# Patient Record
Sex: Male | Born: 1935
Health system: Southern US, Community
[De-identification: ages and names within clinical notes are randomized; demographics above are authoritative.]

## PROBLEM LIST (undated history)

## (undated) DIAGNOSIS — IMO0001 Reserved for inherently not codable concepts without codable children: Secondary | ICD-10-CM

## (undated) DIAGNOSIS — K219 Gastro-esophageal reflux disease without esophagitis: Secondary | ICD-10-CM

## (undated) DIAGNOSIS — J309 Allergic rhinitis, unspecified: Secondary | ICD-10-CM

## (undated) DIAGNOSIS — K5790 Diverticulosis of intestine, part unspecified, without perforation or abscess without bleeding: Secondary | ICD-10-CM

## (undated) DIAGNOSIS — K2210 Ulcer of esophagus without bleeding: Secondary | ICD-10-CM

## (undated) DIAGNOSIS — R7303 Prediabetes: Secondary | ICD-10-CM

## (undated) DIAGNOSIS — I2510 Atherosclerotic heart disease of native coronary artery without angina pectoris: Secondary | ICD-10-CM

## (undated) DIAGNOSIS — J189 Pneumonia, unspecified organism: Secondary | ICD-10-CM

## (undated) DIAGNOSIS — I10 Essential (primary) hypertension: Secondary | ICD-10-CM

## (undated) DIAGNOSIS — M545 Low back pain, unspecified: Secondary | ICD-10-CM

## (undated) DIAGNOSIS — K649 Unspecified hemorrhoids: Secondary | ICD-10-CM

## (undated) DIAGNOSIS — I4891 Unspecified atrial fibrillation: Secondary | ICD-10-CM

## (undated) DIAGNOSIS — D693 Immune thrombocytopenic purpura: Secondary | ICD-10-CM

## (undated) DIAGNOSIS — E7800 Pure hypercholesterolemia, unspecified: Secondary | ICD-10-CM

## (undated) DIAGNOSIS — D126 Benign neoplasm of colon, unspecified: Secondary | ICD-10-CM

## (undated) DIAGNOSIS — H9190 Unspecified hearing loss, unspecified ear: Secondary | ICD-10-CM

## (undated) HISTORY — DX: Diverticulosis of intestine, part unspecified, without perforation or abscess without bleeding: K57.90

## (undated) HISTORY — DX: Pure hypercholesterolemia, unspecified: E78.00

## (undated) HISTORY — PX: HEMORRHOID BANDING: SHX5850

## (undated) HISTORY — DX: Unspecified atrial fibrillation: I48.91

## (undated) HISTORY — PX: TONSILLECTOMY AND ADENOIDECTOMY: SUR1326

## (undated) HISTORY — DX: Benign neoplasm of colon, unspecified: D12.6

## (undated) HISTORY — DX: Atherosclerotic heart disease of native coronary artery without angina pectoris: I25.10

## (undated) HISTORY — DX: Unspecified hearing loss, unspecified ear: H91.90

## (undated) HISTORY — DX: Unspecified hemorrhoids: K64.9

## (undated) HISTORY — DX: Immune thrombocytopenic purpura: D69.3

## (undated) HISTORY — DX: Low back pain, unspecified: M54.50

## (undated) HISTORY — DX: Gastro-esophageal reflux disease without esophagitis: K21.9

## (undated) HISTORY — DX: Pneumonia, unspecified organism: J18.9

## (undated) HISTORY — DX: Reserved for inherently not codable concepts without codable children: IMO0001

## (undated) HISTORY — DX: Allergic rhinitis, unspecified: J30.9

## (undated) HISTORY — DX: Essential (primary) hypertension: I10

## (undated) HISTORY — DX: Ulcer of esophagus without bleeding: K22.10

## (undated) HISTORY — PX: CATARACT EXTRACTION: SUR2

## (undated) HISTORY — DX: Prediabetes: R73.03

## (undated) HISTORY — PX: COLONOSCOPY W/ POLYPECTOMY: SHX1380

## (undated) HISTORY — PX: INGUINAL HERNIA REPAIR: SHX194

---

## 2009-08-02 HISTORY — PX: CORONARY ARTERY BYPASS GRAFT: SHX141

## 2010-09-03 HISTORY — PX: UPPER GI ENDOSCOPY: SHX6162

## 2011-05-12 ENCOUNTER — Ambulatory Visit (INDEPENDENT_AMBULATORY_CARE_PROVIDER_SITE_OTHER): Payer: Medicare HMO | Admitting: Cardiology

## 2011-05-12 ENCOUNTER — Encounter: Payer: Self-pay | Admitting: Cardiology

## 2011-05-12 VITALS — BP 140/76 | HR 60 | Resp 16 | Ht 70.0 in | Wt 158.0 lb

## 2011-05-12 DIAGNOSIS — I4891 Unspecified atrial fibrillation: Secondary | ICD-10-CM | POA: Insufficient documentation

## 2011-05-12 DIAGNOSIS — E785 Hyperlipidemia, unspecified: Secondary | ICD-10-CM

## 2011-05-12 DIAGNOSIS — I10 Essential (primary) hypertension: Secondary | ICD-10-CM | POA: Insufficient documentation

## 2011-05-12 DIAGNOSIS — I2510 Atherosclerotic heart disease of native coronary artery without angina pectoris: Secondary | ICD-10-CM | POA: Insufficient documentation

## 2011-05-12 MED ORDER — ASPIRIN EC 81 MG PO TBEC: 81.0000 mg | DELAYED_RELEASE_TABLET | Freq: Every day | ORAL | Status: AC

## 2011-05-12 NOTE — Assessment & Plan Note (Signed)
I reviewed his lipids and they were at target this month.  No change in therapy is indicated.

## 2011-05-12 NOTE — Patient Instructions (Signed)
Stop Coumadin and start Asprin 81 mg a day Continue all other medications as listed  Follow up in 6 months with Dr Antoine Poche.  You will receive a letter in the mail 2 months before you are due.  Please call us when you receive this letter to schedule your follow up appointment.

## 2011-05-12 NOTE — Assessment & Plan Note (Signed)
I reviewed extensively all of his hospital notes.  He was only in afib in the hospital post CABG.  He has had no recurrence of this documented.  Therefor, according to guidelines, no further coumadin is indicated.  He start 81 mg ASA.

## 2011-05-12 NOTE — Assessment & Plan Note (Signed)
The patient has no new sypmtoms.  No further cardiovascular testing is indicated.  We will continue with aggressive risk reduction and meds as listed.  

## 2011-05-12 NOTE — Assessment & Plan Note (Signed)
The blood pressure is at target. No change in medications is indicated. We will continue with therapeutic lifestyle changes (TLC).  

## 2011-05-12 NOTE — Progress Notes (Signed)
HPI The patient presents as a new patient.  He has a history of CAD s/p CABG.  He had his angina. He presented with unstable angina and went to bypass surgery. He has now moved here. Since having his surgery he does very well. He denies any chest discomfort, neck or arm discomfort. He has no palpitations, presyncope or syncope. He denies any shortness of breath, PND or orthopnea. He works very hard and walks 30 miles per week. With this he can bring on his previous symptoms.  Allergies  Allergen Reactions  . Sulfur     Current Outpatient Prescriptions  Medication Sig Dispense Refill  . atorvastatin (LIPITOR) 40 MG tablet Take 40 mg by mouth daily.        . calcium carbonate (OS-CAL) 600 MG TABS Take 600 mg by mouth daily.        . fexofenadine (ALLEGRA) 180 MG tablet Take 180 mg by mouth daily.        [name] lisinopril-hydrochlorothiazide (PRINZIDE,ZESTORETIC) 20-12.5 MG per tablet Take 1 tablet by mouth daily.        . metoprolol tartrate (LOPRESSOR) 25 MG tablet Take 25 mg by mouth 2 (two) times daily.        . Multiple Vitamin (MULTIVITAMIN) tablet Take 1 tablet by mouth daily.        . Omega-3 Fatty Acids (FISH OIL PO) Take by mouth.        . vitamin C (ASCORBIC ACID) 500 MG tablet Take 500 mg by mouth daily.        [name] warfarin (COUMADIN) 5 MG tablet Take 5 mg by mouth as directed.          Past Medical History  Diagnosis Date  . Hypercholesteremia   . Hypertension   . Atherosclerosis   . Arrhythmia     peri op afib    Past Surgical History  Procedure Date  . Coronary artery bypass graft     Family History  Problem Relation Age of Onset  . Heart disease Brother     History   Social History  . Marital Status: Unknown    Spouse Name: N/A    Number of Children: N/A  . Years of Education: N/A   Occupational History  . Not on file.   Social History Main Topics  . Smoking status: Never Smoker   . Smokeless tobacco: Not on file  . Alcohol Use: Not on file  . Drug Use:  Not on file  . Sexually Active: Not on file   Other Topics Concern  . Not on file   Social History Narrative  . No narrative on file    ROS:  Recent pneumonia.  Otherwise as stated in the HPI and negative for all other systems.   PHYSICAL EXAM BP 140/76  Pulse 60  Resp 16  Ht 5\' 10"  (1.778 m)  Wt 158 lb (71.668 kg)  BMI 22.67 kg/m2 GENERAL:  Well appearing HEENT:  Pupils equal round and reactive, fundi not visualized, oral mucosa unremarkable NECK:  No jugular venous distention, waveform within normal limits, carotid upstroke brisk and symmetric, no bruits, no thyromegaly LYMPHATICS:  No cervical, inguinal adenopathy LUNGS:  Clear to auscultation bilaterally BACK:  No CVA tenderness CHEST:  Well healed sternotomy scar. HEART:  PMI not displaced or sustained,S1 and S2 within normal limits, no S3, no S4, no clicks, no rubs, no murmurs ABD:  Flat, positive bowel sounds normal in frequency in pitch, no bruits, no rebound, no guarding,  no midline pulsatile mass, no hepatomegaly, no splenomegaly EXT:  2 plus pulses throughout, no edema, no cyanosis no clubbing SKIN:  No rashes no nodules NEURO:  Cranial nerves II through XII grossly intact, motor grossly intact throughout PSYCH:  Cognitively intact, oriented to person place and time  EKG:  Normal sinus rhythm, left axis deviation, poor anterior all her progression, PVC  ASSESSMENT AND PLAN

## 2011-11-24 ENCOUNTER — Ambulatory Visit (INDEPENDENT_AMBULATORY_CARE_PROVIDER_SITE_OTHER): Payer: Medicare HMO | Admitting: Cardiology

## 2011-11-24 ENCOUNTER — Encounter: Payer: Self-pay | Admitting: Cardiology

## 2011-11-24 VITALS — BP 139/70 | HR 56 | Ht 67.0 in | Wt 167.0 lb

## 2011-11-24 DIAGNOSIS — E785 Hyperlipidemia, unspecified: Secondary | ICD-10-CM

## 2011-11-24 DIAGNOSIS — I2510 Atherosclerotic heart disease of native coronary artery without angina pectoris: Secondary | ICD-10-CM

## 2011-11-24 DIAGNOSIS — I10 Essential (primary) hypertension: Secondary | ICD-10-CM

## 2011-11-24 NOTE — Patient Instructions (Signed)
The current medical regimen is effective;  continue present plan and medications.  Follow up in 1 year with Dr Hochrein.  You will receive a letter in the mail 2 months before you are due.  Please call us when you receive this letter to schedule your follow up appointment.  

## 2011-11-24 NOTE — Assessment & Plan Note (Signed)
The patient has no new sypmtoms.  No further cardiovascular testing is indicated.  We will continue with aggressive risk reduction and meds as listed.  

## 2011-11-24 NOTE — Progress Notes (Signed)
   HPI The patient presents for follow up of CAD.  He has a history of CAD s/p CABG two years ago.  Since I last saw him he has done well. The patient denies any new symptoms such as chest discomfort, neck or arm discomfort. There has been no new shortness of breath, PND or orthopnea. There have been no reported palpitations, presyncope or syncope.  He does yard work and has had no symptoms.  Allergies  Allergen Reactions  . Sulfur     Current Outpatient Prescriptions  Medication Sig Dispense Refill  . aspirin EC 81 MG tablet Take 1 tablet (81 mg total) by mouth daily.      [name] atorvastatin (LIPITOR) 40 MG tablet Take 40 mg by mouth daily.        . fluticasone (VERAMYST) 27.5 MCG/SPRAY nasal spray Place 2 sprays into the nose daily.      [name] lisinopril-hydrochlorothiazide (PRINZIDE,ZESTORETIC) 20-12.5 MG per tablet Take 1 tablet by mouth daily.        . metoprolol tartrate (LOPRESSOR) 25 MG tablet Take 25 mg by mouth 2 (two) times daily.        . Omega-3 Fatty Acids (FISH OIL PO) Take by mouth.        . vitamin C (ASCORBIC ACID) 500 MG tablet Take 500 mg by mouth daily.          Past Medical History  Diagnosis Date  . Hypercholesteremia   . Hypertension   . CAD (coronary artery disease)   . Arrhythmia     peri op afib    Past Surgical History  Procedure Date  . Coronary artery bypass graft     L - LAD, S - diag, S - PDA/LV branch 2011.  Nl EF  . Tonsillectomy and adenoidectomy   . Cataract extraction     ROS:  Otherwise as stated in the HPI and negative for all other systems.  PHYSICAL EXAM BP 139/70  Pulse 56  Ht 5\' 7"  (1.702 m)  Wt 167 lb (75.751 kg)  BMI 26.16 kg/m2 GENERAL:  Well appearing HEENT:  Pupils equal round and reactive, fundi not visualized, oral mucosa unremarkable NECK:  No jugular venous distention, waveform within normal limits, carotid upstroke brisk and symmetric, no bruits, no thyromegaly LYMPHATICS:  No cervical, inguinal adenopathy LUNGS:  Clear to  auscultation bilaterally BACK:  No CVA tenderness CHEST:  Well healed sternotomy scar. HEART:  PMI not displaced or sustained,S1 and S2 within normal limits, no S3, no S4, no clicks, no rubs, no murmurs ABD:  Flat, positive bowel sounds normal in frequency in pitch, no bruits, no rebound, no guarding, no midline pulsatile mass, no hepatomegaly, no splenomegaly EXT:  2 plus pulses throughout, no edema, no cyanosis no clubbing SKIN:  No rashes no nodules NEURO:  Cranial nerves II through XII grossly intact, motor grossly intact throughout PSYCH:  Cognitively intact, oriented to person place and time  EKG:  Normal sinus rhythm, rate 56, left axis deviation, poor anterior all her progression, no acute ST-T wave changes.  11/24/2011   ASSESSMENT AND PLAN

## 2011-11-24 NOTE — Assessment & Plan Note (Signed)
The blood pressure is at target. No change in medications is indicated. We will continue with therapeutic lifestyle changes (TLC).  

## 2011-11-24 NOTE — Assessment & Plan Note (Signed)
His last LDL was 34.  I agree with Dr. Modesto Charon that he could have his Lipitor reduced if it is this low when repeated.  I will send him over to have this done.

## 2012-12-27 ENCOUNTER — Encounter: Payer: Self-pay | Admitting: Cardiology

## 2012-12-27 ENCOUNTER — Ambulatory Visit (INDEPENDENT_AMBULATORY_CARE_PROVIDER_SITE_OTHER): Payer: Medicare HMO | Admitting: Cardiology

## 2012-12-27 ENCOUNTER — Ambulatory Visit (INDEPENDENT_AMBULATORY_CARE_PROVIDER_SITE_OTHER): Payer: Medicare PPO | Admitting: *Deleted

## 2012-12-27 VITALS — BP 107/65 | HR 71 | Ht 65.0 in | Wt 171.4 lb

## 2012-12-27 DIAGNOSIS — I10 Essential (primary) hypertension: Secondary | ICD-10-CM

## 2012-12-27 DIAGNOSIS — I4891 Unspecified atrial fibrillation: Secondary | ICD-10-CM

## 2012-12-27 DIAGNOSIS — I2510 Atherosclerotic heart disease of native coronary artery without angina pectoris: Secondary | ICD-10-CM

## 2012-12-27 DIAGNOSIS — E785 Hyperlipidemia, unspecified: Secondary | ICD-10-CM

## 2012-12-27 MED ORDER — RIVAROXABAN 20 MG PO TABS: 20.0000 mg | ORAL_TABLET | Freq: Every day | ORAL | Status: DC

## 2012-12-27 NOTE — Progress Notes (Signed)
HPI The patient presents for follow up of CAD.  He has a history of CAD s/p CABG two years ago.  Since I last saw him he has done well. The patient denies any new symptoms such as chest discomfort, neck or arm discomfort. There has been no new shortness of breath, PND or orthopnea. There has been no presyncope or syncope.  He does yard work and has had no symptoms.  He has had some shoulder discomfort that he relates possibly to his yard work. He doesn't think it is like his previous angina. Interestingly he is in atrial fibrillation today and he does not feel this.  Allergies  Allergen Reactions  . Sulfur     Current Outpatient Prescriptions  Medication Sig Dispense Refill  . aspirin 81 MG tablet Take 81 mg by mouth daily.      [name] atorvastatin (LIPITOR) 40 MG tablet Take 40 mg by mouth daily.        [name] lisinopril-hydrochlorothiazide (PRINZIDE,ZESTORETIC) 20-12.5 MG per tablet Take 1 tablet by mouth daily.        . metoprolol tartrate (LOPRESSOR) 25 MG tablet Take 25 mg by mouth 2 (two) times daily.        . Multiple Vitamins-Minerals (CENTRUM SILVER PO) Take by mouth daily.      . Omega-3 Fatty Acids (FISH OIL PO) Take by mouth.        . vitamin C (ASCORBIC ACID) 500 MG tablet Take 500 mg by mouth daily.        . fluticasone (VERAMYST) 27.5 MCG/SPRAY nasal spray Place 2 sprays into the nose daily.       No current facility-administered medications for this visit.    Past Medical History  Diagnosis Date  . Hypercholesteremia   . Hypertension   . CAD (coronary artery disease)   . Arrhythmia     peri op afib    Past Surgical History  Procedure Laterality Date  . Coronary artery bypass graft      L - LAD, S - diag, S - PDA/LV branch 2011.  Nl EF  . Tonsillectomy and adenoidectomy    . Cataract extraction      ROS:  Otherwise as stated in the HPI and negative for all other systems.  PHYSICAL EXAM BP 107/65  Pulse 71  Ht 5\' 5"  (1.651 m)  Wt 171 lb 6.4 oz (77.747 kg)  BMI  28.52 kg/m2 GENERAL:  Well appearing NECK:  No jugular venous distention, waveform within normal limits, carotid upstroke brisk and symmetric, no bruits, no thyromegaly LYMPHATICS:  No cervical, inguinal adenopathy LUNGS:  Clear to auscultation bilaterally BACK:  No CVA tenderness CHEST:  Well healed sternotomy scar. HEART:  PMI not displaced or sustained,S1 and S2 within normal limits, no S3,  no clicks, no rubs, no murmurs, irregular ABD:  Flat, positive bowel sounds normal in frequency in pitch, no bruits, no rebound, no guarding, no midline pulsatile mass, no hepatomegaly, no splenomegaly EXT:  2 plus pulses throughout, no edema, no cyanosis no clubbing NEURO:  Cranial nerves II through XII grossly intact, motor grossly intact throughout   EKG:  Atrial fib, rate 71, left axis deviation, poor anterior all her progression, no acute ST-T wave changes.  12/27/2012   ASSESSMENT AND PLAN  ATRIAL FIBRILLATION: he now has a symptomatic recurrence of this.  Angel Ward has a CHA2DS2 - VASc score of 4 with a risk of stroke of 4%  and a HAS - BLED score of  1 with a low risk of bleeding.  He will be started on Xarelto. He will stop his ASA.  I will check a 24 hour Holter for rate control. I might consider cardioversion in the future. He and I discussed at length the risk benefits of being back on anticoagulation. He was on warfarin in the past.  CAD:  He does have some shoulder discomfort. This is unlikely to be ischemia. However, he has EKG changes in his past history. Baseline EKG would preclude exercise treadmill testing alone. Therefore, I'm going to bring him back for a [employer].  HTN:  The blood pressure is at target. No change in medications is indicated. We will continue with therapeutic lifestyle changes (TLC).  HYPERLIPIDEMIA:  This is excellent and being managed by Dr. Christell Constant.  I will defer his management.

## 2012-12-27 NOTE — Patient Instructions (Addendum)
Please stop your ASA.  Start Xarelto as ordered. Continue all other medications as listed.  Your physician has requested that you have a lexiscan myoview. For further information please visit [email or web address] Please follow instruction sheet, as given.  Your physician has recommended that you wear a holter monitor (you may have this put on at [facility]). Holter monitors are medical devices that record the heart's electrical activity. Doctors most often use these monitors to diagnose arrhythmias. Arrhythmias are problems with the speed or rhythm of the heartbeat. The monitor is a small, portable device. You can wear one while you do your normal daily activities. This is usually used to diagnose what is causing palpitations/syncope (passing out).  Follow up with Dr Antoine Poche in 6 weeks.

## 2012-12-27 NOTE — Patient Instructions (Signed)
Holter Monitoring °A Holter monitor is a small device with electrodes (small sticky patches) that attach to your chest. It records the electrical activity of your heart and is worn continuously for 24-48 hours.  °A HOLTER MONITOR IS USED TO °· Detect heart problems such as: °· Heart arrhythmia. Is an abnormal or irregular heartbeat. With some heart arrhythmias, you may not feel or know that you have an irregular heart rhythm. °· Palpitations, such as feeling your heart racing or fluttering. It is possible to have heart palpitations and not have a heart arrhythmia. °· A heart rhythm that is too slow or too fast. °· If you have problems fainting, near fainting or feeling light-headed, a Holter monitor may be worn to see if your heart is the cause. °HOLTER MONITOR PREPARATION  °· Electrodes will be attached to the skin on your chest. °· If you have hair on your chest, small areas may have to be shaved. This is done to help the patches stick better and make the recording more accurate. °· The electrodes are attached by wires to the Holter monitor. The Holter monitor clips to your clothing. You will wear the monitor at all times, even while exercising and sleeping. °HOME CARE INSTRUCTIONS  °· Wear your monitor at all times. °· The wires and the monitor must stay dry. Do not get the monitor wet. °· Do not bathe, swim or use a hot tub with it on. °· You may do a "sponge" bath while you have the monitor on. °· Keep your skin clean, do not put body lotion or moisturizer on your chest. °· It's possible that your skin under the electrodes could become irritated. To keep this from happening, you may put the electrodes in slightly different places on your chest. °· Your caregiver will also ask you to keep a diary of your activities, such as walking or doing chores. Be sure to note what you are doing if you experience heart symptoms such as palpitations. This will help your caregiver determine what might be contributing to your  symptoms. The information stored in your monitor will be reviewed by your caregiver alongside your diary entries. °· Make sure the monitor is safely clipped to your clothing or in a location close to your body that your caregiver recommends. °· The monitor and electrodes are removed when the test is over. Return the monitor as directed. °· Be sure to follow up with your caregiver and discuss your Holter monitor results. °SEEK IMMEDIATE MEDICAL CARE IF: °· You faint or feel lightheaded. °· You have trouble breathing. °· You get pain in your chest, upper arm or jaw. °· You feel sick to your stomach and your skin is pale, cool, or damp. °· You think something is wrong with the way your heart is beating. °MAKE SURE YOU:  °· Understand these instructions. °· Will watch your condition. °· Will get help right away if you are not doing well or get worse. °Document Released: 04/16/2004 Document Revised: 10/11/2011 Document Reviewed: 08/29/2008 °ExitCare® Patient Information ©2014 [employer]. ° °

## 2012-12-27 NOTE — Progress Notes (Signed)
24 hour holter monitor applied on 12/27/2012 at 12:38

## 2012-12-28 NOTE — Progress Notes (Signed)
holter monitor returned to clinic- now to be downloaded-jhb

## 2012-12-31 HISTORY — PX: CARDIOVASCULAR STRESS TEST: SHX262

## 2013-01-01 NOTE — Progress Notes (Signed)
24 hour holter monitor returned from [facility] and scanned into epic and sent to Dr. Jenene Slicker inbasket

## 2013-01-03 ENCOUNTER — Ambulatory Visit (HOSPITAL_COMMUNITY): Payer: Medicare HMO | Attending: Cardiology | Admitting: Radiology

## 2013-01-03 VITALS — BP 122/74 | Ht 65.0 in | Wt 167.0 lb

## 2013-01-03 DIAGNOSIS — I10 Essential (primary) hypertension: Secondary | ICD-10-CM | POA: Insufficient documentation

## 2013-01-03 DIAGNOSIS — R079 Chest pain, unspecified: Secondary | ICD-10-CM

## 2013-01-03 DIAGNOSIS — E785 Hyperlipidemia, unspecified: Secondary | ICD-10-CM | POA: Insufficient documentation

## 2013-01-03 DIAGNOSIS — I2510 Atherosclerotic heart disease of native coronary artery without angina pectoris: Secondary | ICD-10-CM

## 2013-01-03 DIAGNOSIS — I4891 Unspecified atrial fibrillation: Secondary | ICD-10-CM

## 2013-01-03 DIAGNOSIS — Z951 Presence of aortocoronary bypass graft: Secondary | ICD-10-CM | POA: Insufficient documentation

## 2013-01-03 DIAGNOSIS — M25519 Pain in unspecified shoulder: Secondary | ICD-10-CM | POA: Insufficient documentation

## 2013-01-03 MED ORDER — REGADENOSON 0.4 MG/5ML IV SOLN
0.4000 mg | Freq: Once | INTRAVENOUS | Status: AC
  Administered 2013-01-03: 0.4 mg via INTRAVENOUS

## 2013-01-03 MED ORDER — TECHNETIUM TC 99M SESTAMIBI GENERIC - CARDIOLITE
33.0000 | Freq: Once | INTRAVENOUS | Status: AC | PRN
  Administered 2013-01-03: 33 via INTRAVENOUS

## 2013-01-03 MED ORDER — TECHNETIUM TC 99M SESTAMIBI GENERIC - CARDIOLITE
11.0000 | Freq: Once | INTRAVENOUS | Status: AC | PRN
  Administered 2013-01-03: 11 via INTRAVENOUS

## 2013-01-03 NOTE — Progress Notes (Signed)
[facility] SITE 3 NUCLEAR MED [address] [place], [place] [address] [phone]    Cardiology Nuclear Med Study  Angel Ward is a 79 y.o. male     MRN : [id number]     DOB: 07/05/1934  Procedure Date: 01/03/2013  Nuclear Med Background Indication for Stress Test:  Evaluation for Ischemia, Graft Patency and Recurrent AFIB History:  2011 MPS- Abnormal, Echo - EF 55% CABG Cardiac Risk Factors: Hypertension and Lipids  Symptoms:  L shoulder discomfort   Nuclear Pre-Procedure Caffeine/Decaff Intake:  None > 12 hrs NPO After: 7:00am   Lungs:  clear O2 Sat: 98% on room air. IV 0.9% NS with Angio Cath:  20g  IV Site: R Antecubital x 1, tolerated well IV Started by:  Irean Hong, RN  Chest Size (in):  44 Cup Size: n/a  Height: 5\' 5"  (1.651 m)  Weight:  167 lb (75.751 kg)  BMI:  Body mass index is 27.79 kg/(m^2). 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 mCi   Stress Radionuclide:  Technetium 85m Sestamibi  Stress Radionuclide Dose: 33.0 mCi           Stress Protocol Rest HR: 75 Stress HR: 95  Rest BP: 122/74 Stress BP: 114/71  Exercise Time (min): 2:00 METS: 1.6   Predicted Max HR: 144 bpm % Max HR: 65.97 bpm Rate Pressure Product: 46962   Dose of Adenosine (mg):  n/a Dose of Lexiscan: 0.4 mg  Dose of Atropine (mg): n/a Dose of Dobutamine: n/a mcg/kg/min (at max HR)  Stress Test Technologist: Bonnita Levan, RN  Nuclear Technologist:  Domenic Polite, CNMT     Rest Procedure:  Myocardial perfusion imaging was performed at rest 45 minutes following the intravenous administration of Technetium 60m Sestamibi. Rest ECG: Atrial Fibrilliation  Stress Procedure:  The patient received IV Lexiscan 0.4 mg over  15-seconds.  Technetium 50m Sestamibi injected at 30-seconds.  Quantitative spect images were obtained after a 45 minute delay. Stress ECG: No significant change from baseline ECG  QPS Raw Data Images:  Normal; no motion artifact; normal heart/lung ratio. Stress Images:  Normal homogeneous uptake in all areas of the myocardium. Rest Images:  Normal homogeneous uptake in all areas of the myocardium. Subtraction (SDS):  No evidence of ischemia. Transient Ischemic Dilatation (Normal <1.22):  1.12 Lung/Heart Ratio (Normal <0.45):  0.38  Quantitative Gated Spect Images QGS EDV:  n/a QGS ESV:  n/a  Impression Exercise Capacity:  Lexiscan with low level exercise. BP Response:  Normal blood pressure response. Clinical Symptoms:  No chest pain. ECG Impression:  No significant ST segment change suggestive of ischemia. Comparison with Prior Nuclear Study: No images to compare  Overall Impression:  Low risk stress nuclear study .  No evidence of ischemia.  Study was not gated so LV systolic function was not assessed..  LV Ejection Fraction: Study not gated.  LV Wall Motion:  Study not gated  [employer]

## 2013-01-10 ENCOUNTER — Ambulatory Visit: Payer: Self-pay | Admitting: Family Medicine

## 2013-01-31 ENCOUNTER — Ambulatory Visit: Payer: Medicare HMO | Admitting: Cardiology

## 2013-02-08 ENCOUNTER — Telehealth: Payer: Self-pay | Admitting: Family Medicine

## 2013-02-08 NOTE — Telephone Encounter (Signed)
Episodes of dizziness and nausea.  Describes it as a spinning sensation.   Dr. Antoine Poche recently put him on Xarelto due to A-Fib.  He tried skipping the Xarelto for one day and the dizziness improved.  He is requesting an appointment with Dr. Christell Constant but he will be out of the office until next week.  Currently there are no available appointments with him next week.  I asked if he had contacted Dr. Antoine Poche and he stated that he hasn't been in touch with him yet.  I don't see a telephone encounter in the EMR so I don't believe the patient has called [facility] yet.  I do feel that Dr. Antoine Poche should also be aware of this since the patient is linking the symptoms to the Xarelto.  I will forward a copy of this note to Dr. Christell Constant and to Dr. Antoine Poche for direction.

## 2013-02-08 NOTE — Telephone Encounter (Signed)
Please let Dr. Antoine Poche be aware of this, he or someone in his office may have him stop the medicine and try something different to see if the dizziness gets better

## 2013-02-13 NOTE — Telephone Encounter (Signed)
I spoke with the patient.  He agrees to restart the Xarelto to see if the dizziness comes back but he will not do this until after he comes back from vacation.  He understands he has a 4% risk of stroke without anticoagulation.  He will restart ASA as he has CAD.

## 2013-02-16 ENCOUNTER — Telehealth: Payer: Self-pay | Admitting: Family Medicine

## 2013-02-16 ENCOUNTER — Ambulatory Visit (INDEPENDENT_AMBULATORY_CARE_PROVIDER_SITE_OTHER): Payer: Medicare PPO | Admitting: General Practice

## 2013-02-16 ENCOUNTER — Encounter: Payer: Self-pay | Admitting: General Practice

## 2013-02-16 VITALS — BP 129/65 | HR 89 | Temp 97.3°F | Ht 67.0 in | Wt 167.0 lb

## 2013-02-16 DIAGNOSIS — H8110 Benign paroxysmal vertigo, unspecified ear: Secondary | ICD-10-CM

## 2013-02-16 MED ORDER — MECLIZINE HCL 25 MG PO TABS: 25.0000 mg | ORAL_TABLET | Freq: Three times a day (TID) | ORAL | Status: DC | PRN

## 2013-02-16 NOTE — Telephone Encounter (Signed)
appt made for this am

## 2013-02-16 NOTE — Patient Instructions (Addendum)
Vertigo Vertigo means you feel like you or your surroundings are moving when they are not. Vertigo can be dangerous if it occurs when you are at work, driving, or performing difficult activities.  CAUSES  Vertigo occurs when there is a conflict of signals sent to your brain from the visual and sensory systems in your body. There are many different causes of vertigo, including:  Infections, especially in the inner ear.  A bad reaction to a drug or misuse of alcohol and medicines.  Withdrawal from drugs or alcohol.  Rapidly changing positions, such as lying down or rolling over in bed.  A migraine headache.  Decreased blood flow to the brain.  Increased pressure in the brain from a head injury, infection, tumor, or bleeding. SYMPTOMS  You may feel as though the world is spinning around or you are falling to the ground. Because your balance is upset, vertigo can cause nausea and vomiting. You may have involuntary eye movements (nystagmus). DIAGNOSIS  Vertigo is usually diagnosed by physical exam. If the cause of your vertigo is unknown, your caregiver may perform imaging tests, such as an MRI scan (magnetic resonance imaging). TREATMENT  Most cases of vertigo resolve on their own, without treatment. Depending on the cause, your caregiver may prescribe certain medicines. If your vertigo is related to body position issues, your caregiver may recommend movements or procedures to correct the problem. In rare cases, if your vertigo is caused by certain inner ear problems, you may need surgery. HOME CARE INSTRUCTIONS   Follow your caregiver's instructions.  Avoid driving.  Avoid operating heavy machinery.  Avoid performing any tasks that would be dangerous to you or others during a vertigo episode.  Tell your caregiver if you notice that certain medicines seem to be causing your vertigo. Some of the medicines used to treat vertigo episodes can actually make them worse in some people. SEEK  IMMEDIATE MEDICAL CARE IF:   Your medicines do not relieve your vertigo or are making it worse.  You develop problems with talking, walking, weakness, or using your arms, hands, or legs.  You develop severe headaches.  Your nausea or vomiting continues or gets worse.  You develop visual changes.  A family member notices behavioral changes.  Your condition gets worse. MAKE SURE YOU:  Understand these instructions.  Will watch your condition.  Will get help right away if you are not doing well or get worse. Document Released: 04/28/2005 Document Revised: 10/11/2011 Document Reviewed: 02/04/2011 ExitCare Patient Information 2014 [employer].  

## 2013-02-16 NOTE — Progress Notes (Signed)
  Subjective:    Patient ID: Angel Ward, male    DOB: Feb 14, 1934, 79 y.o.   MRN: [id number]  HPI Patient presents today with complaints of being dizzy. He reports onset was two weeks ago, after working out in the yard for three days. Denies having feeling dizzy while outside in the yard, but the next morning. He denies any recent illness such as URI, sinusitis, ear infections. He denies having these dizzy episodes in the past. He reports feeling dizzy after pressure is applied to neck. He reports having a dentist appointment this past Wednesday and was dizzy upon getting up from chair, felt like pressure from head rest. Denies OTC medications. Reports he has history of allergic rhinnitis and not taking medications consistantly.     Review of Systems  Constitutional: Negative for fever and chills.  HENT: Positive for neck pain. Negative for ear pain, nosebleeds, congestion, neck stiffness, postnasal drip and ear discharge.   Eyes: Negative for photophobia, pain, redness and visual disturbance.  Respiratory: Negative for chest tightness, shortness of breath and wheezing.   Cardiovascular: Negative for chest pain and palpitations.  Genitourinary: Negative for dysuria, hematuria and difficulty urinating.  Neurological: Positive for dizziness and headaches. Negative for speech difficulty, weakness and numbness.       Bandlike headache       Objective:   Physical Exam  Constitutional: He is oriented to person, place, and time. He appears well-developed and well-nourished.  HENT:  Head: Normocephalic and atraumatic.  Right Ear: External ear normal.  Left Ear: External ear normal.  Nose: Nose normal.  Mouth/Throat: Oropharynx is clear and moist.  Eyes: EOM are normal. Pupils are equal, round, and reactive to light.  Neck: Normal range of motion. Neck supple. No thyromegaly present.  Cardiovascular: Normal rate, regular rhythm and normal heart sounds.   Pulmonary/Chest: Effort normal and  breath sounds normal. No respiratory distress. He exhibits no tenderness.  Lymphadenopathy:    He has no cervical adenopathy.  Neurological: He is alert and oriented to person, place, and time. He has normal reflexes.  Skin: Skin is warm and dry.  Psychiatric: He has a normal mood and affect.          Assessment & Plan:  1. Benign paroxysmal positional vertigo - meclizine (ANTIVERT) 25 MG tablet; Take 1 tablet (25 mg total) by mouth 3 (three) times daily as needed.  Dispense: 30 tablet; Refill: 2 -sedation precautions discussed -fall prevention and safety discussed -will refer for CT scan if no improvement  -RTO in 2 weeks for follow up -Patient verbalized understanding -Coralie Keens, FNP-C

## 2013-02-21 ENCOUNTER — Ambulatory Visit: Payer: Medicare HMO | Admitting: Cardiology

## 2013-02-28 ENCOUNTER — Encounter: Payer: Medicare PPO | Admitting: Family Medicine

## 2013-03-05 ENCOUNTER — Ambulatory Visit (INDEPENDENT_AMBULATORY_CARE_PROVIDER_SITE_OTHER): Payer: Medicare HMO | Admitting: Family Medicine

## 2013-03-05 ENCOUNTER — Encounter: Payer: Self-pay | Admitting: Family Medicine

## 2013-03-05 VITALS — BP 110/76 | HR 66 | Temp 98.1°F | Resp 16 | Ht 68.0 in | Wt 168.0 lb

## 2013-03-05 DIAGNOSIS — I4891 Unspecified atrial fibrillation: Secondary | ICD-10-CM

## 2013-03-05 DIAGNOSIS — R42 Dizziness and giddiness: Secondary | ICD-10-CM

## 2013-03-05 DIAGNOSIS — I10 Essential (primary) hypertension: Secondary | ICD-10-CM

## 2013-03-06 DIAGNOSIS — R42 Dizziness and giddiness: Secondary | ICD-10-CM | POA: Insufficient documentation

## 2013-03-06 NOTE — Assessment & Plan Note (Signed)
Not discussed today, but he'll return in 7-10d to go over this issue in detail.

## 2013-03-06 NOTE — Progress Notes (Signed)
Office Note 03/06/2013  CC:  Chief Complaint  Patient presents with  . Establish Care  . Nasal Congestion    "runny nose" since open heart surgery.    HPI:  Angel Ward is a 79 y.o. White male who is here to establish care. Patient's most recent primary MD: Dr. Modesto Charon at [facility] in [place], [place]. Old records in EPIC/HL EMR were reviewed prior to or during today's visit.  Hx of CAD for which he had CABG in 2011 and since that time denies any probs with symptomatic CAD.   Has nonvalvular a-fib.  Was on xarelto, then began having some dizziness.  Thought it might be the xarelto so with his cardiologist's approval he d/c'd this med for a while and his dizziness changed minimally--then he restarted the xarelto and has not had any further changes in dizziness.  O/V in EMR documenting his dizziness shows the impression was BPPV.  Pt says last labs were 09/2012 at [facility].  Was supposed to have CPE recently but since he has had to switch PCPs this has not happened.  Complains of chronic rhinitis: says trials of veramyst and dymista have not helped, nor has allegra.  He reports having had pneumovax and zostavax at [facility] recall dates/year. Has vaccine card today stating he had Tdap on 05/23/2013.  Past Medical History  Diagnosis Date  . Hypercholesteremia   . Hypertension   . CAD (coronary artery disease)   . Atrial fibrillation     peri op afib initially, then dx'd with permanent a-fib 2014 (xarelto started)  . Adenomatous colon polyp     Past Surgical History  Procedure Laterality Date  . Coronary artery bypass graft  2011    L - LAD, S - diag, S - PDA/LV branch 2011.  Nl EF  . Tonsillectomy and adenoidectomy  as a child  . Cataract extraction    . Inguinal hernia repair  1970s    right groin  . Colonoscopy w/ polypectomy  X 3, most recent 2011    Repeat 5 yrs    Family History  Problem Relation Age of Onset  . Coronary artery disease Brother 15    History   Social  History  . Marital Status: Married    Spouse Name: N/A    Number of Children: 5  . Years of Education: N/A   Occupational History  . Retired    Social History Main Topics  . Smoking status: Never Smoker   . Smokeless tobacco: Not on file  . Alcohol Use: Not on file  . Drug Use: Not on file  . Sexually Active: Not on file   Other Topics Concern  . Not on file   Social History Narrative   Married, 5 children, 10 grandchildren.   Relocated to [place] from [place] in 2012 for warmer climate.  Orig from [place], [place].   Occupation: retired from Human resources officer business (2004).   Hobby: woodworking.   No T/A/Ds.   Exercise: treadmill and stationary bike--issue with knee temporarily got him out of routine--set to restart 03/2013.   Diet: prudent diet but nothing drastic.             Outpatient Encounter Prescriptions as of 03/05/2013  Medication Sig Dispense Refill  . atorvastatin (LIPITOR) 40 MG tablet Take 40 mg by mouth daily.        [name] lisinopril-hydrochlorothiazide (PRINZIDE,ZESTORETIC) 20-12.5 MG per tablet Take 1 tablet by mouth daily.        . metoprolol tartrate (LOPRESSOR)  25 MG tablet Take 25 mg by mouth 2 (two) times daily.        . Multiple Vitamins-Minerals (CENTRUM SILVER PO) Take by mouth daily.      . Omega-3 Fatty Acids (FISH OIL PO) Take by mouth.        . Rivaroxaban (XARELTO) 20 MG TABS Take 1 tablet (20 mg total) by mouth daily.  90 tablet  3  . triamcinolone ointment (TRIDERM) 0.1 % Apply 1 application topically 2 (two) times daily as needed.      . vitamin C (ASCORBIC ACID) 500 MG tablet Take 500 mg by mouth daily.        . fluticasone (VERAMYST) 27.5 MCG/SPRAY nasal spray Place 2 sprays into the nose daily.      . [DISCONTINUED] meclizine (ANTIVERT) 25 MG tablet Take 1 tablet (25 mg total) by mouth 3 (three) times daily as needed.  30 tablet  2   No facility-administered encounter medications on file as of 03/05/2013.    Allergies  Allergen Reactions  .  Sulfa Antibiotics     ROS Review of Systems  Constitutional: Negative for fever and fatigue.  HENT: Positive for congestion and rhinorrhea. Negative for sore throat, dental problem and sinus pressure.   Eyes: Negative for visual disturbance.  Respiratory: Negative for cough and shortness of breath.   Cardiovascular: Negative for chest pain and palpitations.  Gastrointestinal: Negative for nausea and abdominal pain.  Endocrine: Negative for polydipsia, polyphagia and polyuria.  Genitourinary: Negative for dysuria.  Musculoskeletal: Negative for back pain and joint swelling.  Skin: Negative for rash.  Neurological: Negative for tremors, weakness and headaches.  Hematological: Negative for adenopathy.  Psychiatric/Behavioral: Negative for dysphoric mood.     PE; Blood pressure 110/76, pulse 66, temperature 98.1 F (36.7 C), temperature source Temporal, resp. rate 16, height 5\' 8"  (1.727 m), weight 168 lb (76.204 kg), SpO2 100.00%. Gen: Alert, well appearing.  Patient is oriented to person, place, time, and situation. AFFECT: pleasant.  Displays lucid thought and speech. ENT:  Eyes: no injection, icteris, swelling, or exudate.  EOMI, PERRLA. Nose: no drainage or turbinate edema/swelling.  No injection or focal lesion.  Mouth: lips without lesion/swelling.  Oral mucosa pink and moist.  Oropharynx without erythema, exudate, or swelling.  Neck - No masses or thyromegaly or limitation in range of motion CV: irreg irreg, no murmur or rub.  Rate approx 80s. Chest is clear, no wheezing or rales. Normal symmetric air entry throughout both lung fields. No chest wall deformities or tenderness.  Pertinent labs:  None today  ASSESSMENT AND PLAN:   New patient: obtain old records.  Atrial fibrillation Rate well controlled. Continue xarelto. Per pt report there may be a discussion about possible DC cardioversion at next cardiology f/u.  Dizziness Not discussed today, but he'll return in  7-10d to go over this issue in detail.  Hypertension Stable.  Continue current meds.   Will need to set up CPE with fasting labs in near future.  An After Visit Summary was printed and given to the patient.  Return for o/v 10-14 days to discuss dizziness.

## 2013-03-06 NOTE — Assessment & Plan Note (Signed)
Stable. Continue current meds.   

## 2013-03-06 NOTE — Assessment & Plan Note (Signed)
Rate well controlled. Continue xarelto. Per pt report there may be a discussion about possible DC cardioversion at next cardiology f/u.

## 2013-03-20 ENCOUNTER — Encounter: Payer: Self-pay | Admitting: Family Medicine

## 2013-03-20 ENCOUNTER — Ambulatory Visit (INDEPENDENT_AMBULATORY_CARE_PROVIDER_SITE_OTHER): Payer: Medicare HMO | Admitting: Family Medicine

## 2013-03-20 VITALS — BP 104/68 | HR 69 | Temp 97.4°F | Ht 67.0 in | Wt 169.0 lb

## 2013-03-20 DIAGNOSIS — Z87898 Personal history of other specified conditions: Secondary | ICD-10-CM

## 2013-03-20 DIAGNOSIS — Z125 Encounter for screening for malignant neoplasm of prostate: Secondary | ICD-10-CM

## 2013-03-20 DIAGNOSIS — H8110 Benign paroxysmal vertigo, unspecified ear: Secondary | ICD-10-CM

## 2013-03-20 DIAGNOSIS — E785 Hyperlipidemia, unspecified: Secondary | ICD-10-CM

## 2013-03-20 DIAGNOSIS — I4891 Unspecified atrial fibrillation: Secondary | ICD-10-CM

## 2013-03-20 LAB — CBC WITH DIFFERENTIAL/PLATELET
Basophils Absolute: 0 10*3/uL (ref 0.0–0.1)
Basophils Relative: 0.7 % (ref 0.0–3.0)
Eosinophils Absolute: 0.2 10*3/uL (ref 0.0–0.7)
MCHC: 33.3 g/dL (ref 30.0–36.0)
MCV: 93.4 fl (ref 78.0–100.0)
Monocytes Absolute: 0.5 10*3/uL (ref 0.1–1.0)
Neutro Abs: 4.6 10*3/uL (ref 1.4–7.7)
Neutrophils Relative %: 67.8 % (ref 43.0–77.0)
RBC: 4.69 Mil/uL (ref 4.22–5.81)
RDW: 13.4 % (ref 11.5–14.6)

## 2013-03-20 LAB — COMPREHENSIVE METABOLIC PANEL
Alkaline Phosphatase: 52 U/L (ref 39–117)
BUN: 18 mg/dL (ref 6–23)
CO2: 28 mEq/L (ref 19–32)
Creatinine, Ser: 0.9 mg/dL (ref 0.4–1.5)
GFR: 84.76 mL/min (ref >60.00)
Glucose, Bld: 95 mg/dL (ref 70–99)
Sodium: 136 mEq/L (ref 135–145)
Total Bilirubin: 1.5 mg/dL — ABNORMAL HIGH (ref 0.3–1.2)
Total Protein: 6.2 g/dL (ref 6.0–8.3)

## 2013-03-20 LAB — LIPID PANEL
Cholesterol: 124 mg/dL (ref 0–200)
LDL Cholesterol: 66 mg/dL (ref 0–99)
Triglycerides: 85 mg/dL (ref 0.0–149.0)
VLDL: 17 mg/dL (ref 0.0–40.0)

## 2013-03-20 NOTE — Progress Notes (Signed)
OFFICE NOTE  03/20/2013  CC:  Chief Complaint  Patient presents with  . Follow-up     HPI: Patient is a 79 y.o. Caucasian male who is here for discussion of dizziness. Says dizziness has been completely absent for a week now.   Describes having an initial very distinct episode of vertigo upon getting out of bed one day about 6 wks ago, slightly nauseous, calmed down after 30 sec or so, remainder of day had light episodes, then throughout most days since then has had a mild "band-like" HA/pressure sensation that goes away if he gets up an moves around some.  Has no longer had any vertigo but continued to have slight sensation of feeling disequilibrium from time to time most days.  Unclear if antivert helped.  No clear connection with meds initially, but he attributes his lack of head pressure and any disequilibrium to a switch from allegra to zyrtec a week ago.  Pertinent PMH:  Past Medical History  Diagnosis Date  . Hypercholesteremia   . Hypertension   . CAD (coronary artery disease)   . Atrial fibrillation     peri op afib initially, then dx'd with permanent a-fib 2014 (xarelto started)  . Adenomatous colon polyp     MEDS:  Outpatient Prescriptions Prior to Visit  Medication Sig Dispense Refill  . atorvastatin (LIPITOR) 40 MG tablet Take 40 mg by mouth daily.        . fluticasone (VERAMYST) 27.5 MCG/SPRAY nasal spray Place 2 sprays into the nose daily.      [name] lisinopril-hydrochlorothiazide (PRINZIDE,ZESTORETIC) 20-12.5 MG per tablet Take 1 tablet by mouth daily.        . metoprolol tartrate (LOPRESSOR) 25 MG tablet Take 25 mg by mouth 2 (two) times daily.        . Multiple Vitamins-Minerals (CENTRUM SILVER PO) Take by mouth daily.      . Omega-3 Fatty Acids (FISH OIL PO) Take by mouth.        . Rivaroxaban (XARELTO) 20 MG TABS Take 1 tablet (20 mg total) by mouth daily.  90 tablet  3  . triamcinolone ointment (TRIDERM) 0.1 % Apply 1 application topically 2 (two) times daily as  needed.      . vitamin C (ASCORBIC ACID) 500 MG tablet Take 500 mg by mouth daily.         No facility-administered medications prior to visit.    PE: Blood pressure 104/68, pulse 69, temperature 97.4 F (36.3 C), temperature source Temporal, height 5\' 7"  (1.702 m), weight 169 lb (76.658 kg), SpO2 98.00%. Gen: Alert, well appearing.  Patient is oriented to person, place, time, and situation. Neck - No masses or thyromegaly or limitation in range of motion CV: RRR, no m/r/g.   LUNGS: CTA bilat, nonlabored resps, good aeration in all lung fields. Dix-Halpike maneuvers: negative bilat.   IMPRESSION AND PLAN:  H/O dizziness He had BPPV and some residual disequilibrium but everything appears to have resolved at this time. Reassured pt. Signs/symptoms to call or return for were reviewed and pt expressed understanding. I did fasting labs today for his upcoming CPE--which he'll schedule at his convenience.  An After Visit Summary was printed and given to the patient.  FOLLOW UP: at his convenience for cPE with [facility]

## 2013-03-20 NOTE — Assessment & Plan Note (Signed)
He had BPPV and some residual disequilibrium but everything appears to have resolved at this time. Reassured pt. Signs/symptoms to call or return for were reviewed and pt expressed understanding. I did fasting labs today for his upcoming CPE--which he'll schedule at his convenience.

## 2013-03-22 ENCOUNTER — Encounter: Payer: Self-pay | Admitting: *Deleted

## 2013-04-19 ENCOUNTER — Ambulatory Visit (INDEPENDENT_AMBULATORY_CARE_PROVIDER_SITE_OTHER): Payer: Medicare HMO | Admitting: Family Medicine

## 2013-04-19 ENCOUNTER — Encounter: Payer: Self-pay | Admitting: Family Medicine

## 2013-04-19 VITALS — BP 115/69 | HR 61 | Temp 97.8°F | Resp 16 | Ht 67.0 in | Wt 169.0 lb

## 2013-04-19 DIAGNOSIS — Z23 Encounter for immunization: Secondary | ICD-10-CM

## 2013-04-19 DIAGNOSIS — I951 Orthostatic hypotension: Secondary | ICD-10-CM

## 2013-04-19 DIAGNOSIS — R42 Dizziness and giddiness: Secondary | ICD-10-CM

## 2013-04-19 NOTE — Progress Notes (Addendum)
OFFICE NOTE  04/19/2013  CC:  Chief Complaint  Patient presents with  . Dizziness  . Nasal Congestion    x a few days     HPI: Patient is a 79 y.o. Caucasian male who is here for dizziness.   Describes recurrent episodes of vertigo with nausea when he lies back in bed at night.  Most recent episode was last night.  First started happening 01/2013, has had periods of days-weeks without problem then there is what seems like spontaneous return.  Says it often begins the day after he has been doing work that makes him look upward repetitively like when trimming limbs up in a tree recently.  Doesn't occur WHILE doing the looking up, but the next day or two he notes more probs with vertigo.  He has also noted some lightheaded feeling, not quite true vertigo, when he gets up from supine position or stands up after sitting down.  Lasts 15-30 sec at the most.  Denies any problems feeling off balance when walking.  BP checks at home all < 110 syst, and he shows me 6 today that are 70s-90s over 40s-60s.  HR mostly 60s, one in the 80s.  When asks how he feels when these bp's are taken he says he doesn't feel any dizziness or vertigo at these times.  He has wondered whether or not it was the xarelto that has been causing this b/c the prob seems to have come after starting this med.  It is unclear if a brief trial off the xarelto when he was on vacation truly made him feel any different or possibly was just coincidental with a spont resolution of his BPPV sx's.  ROS: nasal congestion with no cough or fevers for the last few days.  No HA's.  No focal weakness. No CP, no SOB.    Pertinent PMH:  Past Medical History  Diagnosis Date  . Hypercholesteremia   . Hypertension   . CAD (coronary artery disease)   . Atrial fibrillation     peri op afib initially, then dx'd with permanent a-fib 2014 (xarelto started)  . Adenomatous colon polyp    Past Surgical History  Procedure Laterality Date  . Coronary  artery bypass graft  2011    L - LAD, S - diag, S - PDA/LV branch 2011.  Nl EF  . Tonsillectomy and adenoidectomy  as a child  . Cataract extraction    . Inguinal hernia repair  1970s    right groin  . Colonoscopy w/ polypectomy  X 3, most recent 2011    Repeat due 2016  . Cardiovascular stress test  12/2012    Nuclear stress (exercise) testing: low risk scan    MEDS:  Outpatient Prescriptions Prior to Visit  Medication Sig Dispense Refill  . atorvastatin (LIPITOR) 40 MG tablet Take 40 mg by mouth daily.        . metoprolol tartrate (LOPRESSOR) 25 MG tablet Take 25 mg by mouth 2 (two) times daily.        . Multiple Vitamins-Minerals (CENTRUM SILVER PO) Take by mouth daily.      . Omega-3 Fatty Acids (FISH OIL PO) Take by mouth.        . Rivaroxaban (XARELTO) 20 MG TABS Take 1 tablet (20 mg total) by mouth daily.  90 tablet  3  . vitamin C (ASCORBIC ACID) 500 MG tablet Take 500 mg by mouth daily.        [name] lisinopril-hydrochlorothiazide (PRINZIDE,ZESTORETIC) 20-12.5  MG per tablet Take 1 tablet by mouth daily.        . fluticasone (VERAMYST) 27.5 MCG/SPRAY nasal spray Place 2 sprays into the nose daily.      [name] triamcinolone ointment (TRIDERM) 0.1 % Apply 1 application topically 2 (two) times daily as needed.       No facility-administered medications prior to visit.    PE: Blood pressure 115/69, pulse 61, temperature 97.8 F (36.6 C), temperature source Temporal, resp. rate 16, height 5\' 7"  (1.702 m), weight 169 lb (76.658 kg), SpO2 99.00%. Gen: Alert, well appearing.  Patient is oriented to person, place, time, and situation. AFFECT: pleasant, lucid thought and speech. ENT: Ears: EACs clear, normal epithelium.  TMs with good light reflex and landmarks bilaterally.  Eyes: no injection, icteris, swelling, or exudate.  EOMI, PERRLA. Nose: no drainage or turbinate edema/swelling.  No injection or focal lesion.  Mouth: lips without lesion/swelling.  Oral mucosa pink and moist.  Dentition  intact and without obvious caries or gingival swelling.  Oropharynx without erythema, exudate, or swelling.  Neck - No masses or thyromegaly or limitation in range of motion CV: Irreg irreg rhythm, rate about 70.  LUNGS: CTA bilat, nonlabored resps, good aeration in all lung fields. NEURO: no tremor, no pronator drift.  UE and LE strength 5/5 prox and dist bilat.  No ataxia. Dix-Halpike maneuvers in office today elicit no sx's and no nystagmus.  Sitting up from supine positive repeatedly leads to mild dizziness that resolves in 15 sec or so.  LABS: none today  IMPRESSION AND PLAN:  Vertigo His description sounds like positional vertigo, plus he sounds like he has a problem with orthostatic hypotension (and hypotension without sx's as well) that if contributing to his sx's. I recommended he stop his hctz/lisinopril today.  He may continue lopressor 25mg  bid.   Check bp daily and call if persistently <110/60 or > 160/100. I also would like to have ENT see him to help confirm (or not) my diagnosis of positional vertigo.   I encouraged him to stay on xarelto at this time b/c I don't think this is causing any of his symptoms (<6 % incidence of dizziness as a side effect as per UTD). I also strongly recommended that he NOT climb ladders until we get some of this better figured out.  Flu vaccine IM today.  An After Visit Summary was printed and given to the patient.  FOLLOW UP: 1 mo

## 2013-04-19 NOTE — Patient Instructions (Signed)
Stop lisinopril/HCTZ (blood pressure med). Continue all other meds.  Monitor bp daily and call if persistently <110/60 or >160/100.

## 2013-04-19 NOTE — Assessment & Plan Note (Addendum)
His description sounds like positional vertigo, plus he sounds like he has a problem with orthostatic hypotension (and hypotension without sx's as well) that if contributing to his sx's. I recommended he stop his hctz/lisinopril today.  He may continue lopressor 25mg  bid.   Check bp daily and call if persistently <110/60 or > 160/100. I also would like to have ENT see him to help confirm (or not) my diagnosis of positional vertigo.   I encouraged him to stay on xarelto at this time b/c I don't think this is causing any of his symptoms (<6 % incidence of dizziness as a side effect as per UTD). I also strongly recommended that he NOT climb ladders until we get some of this better figured out.

## 2013-05-18 ENCOUNTER — Encounter: Payer: Self-pay | Admitting: Family Medicine

## 2013-05-18 ENCOUNTER — Ambulatory Visit (INDEPENDENT_AMBULATORY_CARE_PROVIDER_SITE_OTHER): Payer: Medicare HMO | Admitting: Family Medicine

## 2013-05-18 VITALS — BP 121/70 | HR 67 | Temp 98.3°F | Resp 18 | Ht 68.0 in | Wt 172.0 lb

## 2013-05-18 DIAGNOSIS — H8123 Vestibular neuronitis, bilateral: Secondary | ICD-10-CM

## 2013-05-18 DIAGNOSIS — H8120 Vestibular neuronitis, unspecified ear: Secondary | ICD-10-CM

## 2013-05-18 DIAGNOSIS — G450 Vertebro-basilar artery syndrome: Secondary | ICD-10-CM | POA: Insufficient documentation

## 2013-05-18 NOTE — Progress Notes (Signed)
OFFICE NOTE  05/18/2013  CC:  Chief Complaint  Patient presents with  . Follow-up     HPI: Patient is a 79 y.o. Caucasian male who is here for 1 mo f/u vertigo and orthostatic hypotension. Saw ENT on 04/20/13 after our last visit and dx was peripheral vertigo but they also commented that they weren't sure whether it was classic/true BPPV or not.  Also dx'd vasomotor rhinitis and started him on nasal spray. Still describes consistent feeling of vertigo when he lies back and extends his neck.  He adds that he also gets a numbness-like feeling in back of neck and occiput region briefly when this occurs.  No nausea.  The feeling is consistently relieved when he stops neck extension.  Movement of his arms does not bring the symptoms on, nor does any other head/neck movement or body posture. Has no c/o neck pain or pain/tingling/numbness radiating into arms.  Off of his hctz/lisin he has noted only a couple of bps <110 systolic.  No elevated bps.   Pertinent PMH:  Past Medical History  Diagnosis Date  . Hypercholesteremia   . Hypertension   . CAD (coronary artery disease)   . Atrial fibrillation     peri op afib initially, then dx'd with permanent a-fib 2014 (xarelto started)  . Adenomatous colon polyp     MEDS:  Outpatient Prescriptions Prior to Visit  Medication Sig Dispense Refill  . atorvastatin (LIPITOR) 40 MG tablet Take 40 mg by mouth daily.        . cetirizine (ZYRTEC) 10 MG tablet Take 10 mg by mouth daily.      . fluticasone (VERAMYST) 27.5 MCG/SPRAY nasal spray Place 2 sprays into the nose daily.      . metoprolol tartrate (LOPRESSOR) 25 MG tablet Take 25 mg by mouth 2 (two) times daily.        . Multiple Vitamins-Minerals (CENTRUM SILVER PO) Take by mouth daily.      . Omega-3 Fatty Acids (FISH OIL PO) Take by mouth.        . Rivaroxaban (XARELTO) 20 MG TABS Take 1 tablet (20 mg total) by mouth daily.  90 tablet  3  . triamcinolone ointment (TRIDERM) 0.1 % Apply 1  application topically 2 (two) times daily as needed.      . vitamin C (ASCORBIC ACID) 500 MG tablet Take 500 mg by mouth daily.         No facility-administered medications prior to visit.    PE: Blood pressure 121/70, pulse 67, temperature 98.3 F (36.8 C), temperature source Temporal, resp. rate 18, height 5\' 8"  (1.727 m), weight 172 lb (78.019 kg), SpO2 98.00%. Gen: Alert, well appearing.  Patient is oriented to person, place, time, and situation. Neck: supple/nontender.  No LAD, mass, or TM.  Carotid pulses 2+ bilaterally, without bruits. No c spine/neck tenderness posteriorly.  He has full c-spine ROM except in lateral flexion he is limited a bit.  Full neck extension does elicit his symptoms of vague vertiginous sensation after about 8-10 seconds.  It goes away when he gets the neck back into neutral position.   CV: irreg irreg, no m/r/g Chest is clear, no wheezing or rales. Normal symmetric air entry throughout both lung fields. No chest wall deformities or tenderness. UE strength 5/5 bilat.  IMPRESSION AND PLAN:  Vertebral artery syndrome His sx's are possibly due to compression of vertebral arteries (unilat or bilat) when he extends his neck. I have discussed w/u of this condition  with Dr. Tyron Russell in Radiology and he discussed it with the interventional radiologist----final recommendation was to get a CT angiogram with contrast with patient in neck extension. Will arrange this ASAP. Continue all current meds at this time.  An After Visit Summary was printed and given to the patient.  FOLLOW UP: to be determined based on results of tests.

## 2013-05-18 NOTE — Assessment & Plan Note (Signed)
His sx's are possibly due to compression of vertebral arteries (unilat or bilat) when he extends his neck. I have discussed w/u of this condition with Dr. Tyron Russell in Radiology and he discussed it with the interventional radiologist----final recommendation was to get a CT angiogram with contrast with patient in neck extension. Will arrange this ASAP. Continue all current meds at this time.

## 2013-05-23 ENCOUNTER — Telehealth: Payer: Self-pay | Admitting: Family Medicine

## 2013-05-23 NOTE — Telephone Encounter (Signed)
Patient is no longer seeing provider that prescribed medication. Patient states the instructions read as 1 Tablet twice daily. Advised patient that physician is out of office today & that refill will not be addressed until 05/24/13. Patient is okay, stated he has a week's worth of medication left.

## 2013-05-23 NOTE — Telephone Encounter (Signed)
Metropolol Tart 25 MG 90 day to [facility]. Please advise refill?

## 2013-05-24 ENCOUNTER — Other Ambulatory Visit (INDEPENDENT_AMBULATORY_CARE_PROVIDER_SITE_OTHER): Payer: Medicare HMO

## 2013-05-24 ENCOUNTER — Other Ambulatory Visit: Payer: Self-pay | Admitting: Family Medicine

## 2013-05-24 DIAGNOSIS — R42 Dizziness and giddiness: Secondary | ICD-10-CM

## 2013-05-24 LAB — BASIC METABOLIC PANEL
BUN: 13 mg/dL (ref 6–23)
Chloride: 104 mEq/L (ref 96–112)
Creatinine, Ser: 0.9 mg/dL (ref 0.4–1.5)
GFR: 82.64 mL/min (ref >60.00)
Glucose, Bld: 99 mg/dL (ref 70–99)
Potassium: 4.7 mEq/L (ref 3.5–5.1)

## 2013-05-24 MED ORDER — METOPROLOL TARTRATE 25 MG PO TABS: 25.0000 mg | ORAL_TABLET | Freq: Two times a day (BID) | ORAL | Status: DC

## 2013-05-24 NOTE — Progress Notes (Signed)
Labs only

## 2013-05-28 ENCOUNTER — Ambulatory Visit
Admission: RE | Admit: 2013-05-28 | Discharge: 2013-05-28 | Disposition: A | Discharge status: Home / Self Care | Payer: Commercial Managed Care - HMO | Source: Ambulatory Visit | Attending: Family Medicine | Admitting: Family Medicine

## 2013-05-28 DIAGNOSIS — H8123 Vestibular neuronitis, bilateral: Secondary | ICD-10-CM

## 2013-05-28 DIAGNOSIS — G450 Vertebro-basilar artery syndrome: Secondary | ICD-10-CM

## 2013-05-28 MED ORDER — IOHEXOL 350 MG/ML SOLN
80.0000 mL | Freq: Once | INTRAVENOUS | Status: AC | PRN
  Administered 2013-05-28: 80 mL via INTRAVENOUS

## 2013-06-01 ENCOUNTER — Telehealth: Payer: Self-pay | Admitting: Family Medicine

## 2013-06-01 NOTE — Telephone Encounter (Signed)
Called pt and discussed results/plan with him.

## 2013-06-01 NOTE — Telephone Encounter (Signed)
Patient is inquiring about CT results. Please call

## 2013-11-05 ENCOUNTER — Telehealth: Payer: Self-pay | Admitting: Cardiology

## 2013-11-05 NOTE — Telephone Encounter (Signed)
Patient can't afford this medication, wants you to call to see if he can change to less expensive.

## 2013-11-05 NOTE — Telephone Encounter (Signed)
We can switch him to warfarin.  Please refer to the warfarin clinic.

## 2013-11-05 NOTE — Telephone Encounter (Signed)
Discussed with pt - scheduled an appointment for pt to see Dr Percival Spanish in [place] 11/28/2013 at 12N  Will take samples to [place] 20 mg a day next visit so that pt will have enough to get thru to that appointment.

## 2013-11-05 NOTE — Telephone Encounter (Signed)
New message    Pt want to change to a different medication because he cannot afford it.

## 2013-11-06 ENCOUNTER — Other Ambulatory Visit: Payer: Self-pay | Admitting: Family Medicine

## 2013-11-06 ENCOUNTER — Telehealth: Payer: Self-pay

## 2013-11-06 DIAGNOSIS — I2510 Atherosclerotic heart disease of native coronary artery without angina pectoris: Secondary | ICD-10-CM

## 2013-11-06 DIAGNOSIS — I4891 Unspecified atrial fibrillation: Secondary | ICD-10-CM

## 2013-11-06 MED ORDER — WARFARIN SODIUM 5 MG PO TABS: 5.0000 mg | ORAL_TABLET | Freq: Every day | ORAL | Status: DC

## 2013-11-06 NOTE — Addendum Note (Signed)
Addended by: Shellia Cleverly on: 11/06/2013 10:58 AM   Modules accepted: Orders, Medications

## 2013-11-06 NOTE — Telephone Encounter (Signed)
Referral order for Dr. Percival Spanish has been entered. [place] for me to handle INR checks/coumadin dosing.-thx

## 2013-11-06 NOTE — Telephone Encounter (Signed)
Per Dr Percival Spanish OK to change to Warfarin.  Pt is aware.  He wants to have INR's checked at his PCP Dr Anitra Lauth.  Will check with them to see if they will adjust otherwise patient will need to come to our [place] in [place].  Pt also needs a referral to see Dr Percival Spanish in [place] for his upcoming appt.  He is aware I will call back with further instructions.  Spoke with Jonelle Sidle at [facility] - pt can have PT/INR's drawn at their office.  Appt scheduled for 4/13 at 9am.  She will check with Dr Anitra Lauth to make sure he will adjust and follow Warfarin.  They will put the referral in for insurance for pt to see Dr Percival Spanish as scheduled for follow up of At Fib.  Pt is to take both warfarin and Xarelto for 2 days then stop the Xarelto while continuing the warfarin.  Pt states standing  Rx will be sent into [facility] in [place].  He will need a 90 supply sent into [facility] once dose is determined.

## 2013-11-06 NOTE — Telephone Encounter (Signed)
Olin Hauser called and wanted to see if Dr Anitra Lauth can put in a referral to see Dr Percival Spanish due to his insurance. She would also like to know if we could check his INR here and adjust his medications. He recently got switched from Xarelto to Warfarin because of cost. Please advise. I put him on my lab schedule for April the 13th because this would be his next time to check his INR and our location is closer to his house.

## 2013-11-06 NOTE — Telephone Encounter (Signed)
Doristine Devoid, I sent a staff message to Ellwood Dense to let her know.

## 2013-11-12 ENCOUNTER — Other Ambulatory Visit (INDEPENDENT_AMBULATORY_CARE_PROVIDER_SITE_OTHER): Payer: Medicare HMO

## 2013-11-12 DIAGNOSIS — Z7901 Long term (current) use of anticoagulants: Secondary | ICD-10-CM

## 2013-11-12 DIAGNOSIS — Z79899 Other long term (current) drug therapy: Secondary | ICD-10-CM

## 2013-11-12 LAB — PROTIME-INR
INR: 1.9 ratio — AB (ref 0.8–1.0)
Prothrombin Time: 19.5 s — ABNORMAL HIGH (ref 10.2–12.4)

## 2013-11-19 ENCOUNTER — Telehealth: Payer: Self-pay | Admitting: Family Medicine

## 2013-11-19 ENCOUNTER — Other Ambulatory Visit (INDEPENDENT_AMBULATORY_CARE_PROVIDER_SITE_OTHER): Payer: Medicare HMO

## 2013-11-19 DIAGNOSIS — I4891 Unspecified atrial fibrillation: Secondary | ICD-10-CM

## 2013-11-19 LAB — PROTIME-INR
INR: 2.8 ratio — AB (ref 0.8–1.0)
Prothrombin Time: 30.7 s — ABNORMAL HIGH (ref 9.6–13.1)

## 2013-11-19 MED ORDER — ATORVASTATIN CALCIUM 40 MG PO TABS: 40.0000 mg | ORAL_TABLET | Freq: Every day | ORAL | Status: DC

## 2013-11-19 NOTE — Telephone Encounter (Signed)
Patient needs a refill on atorvastatin sent to [facility]

## 2013-11-19 NOTE — Telephone Encounter (Signed)
ERX sent.  

## 2013-11-28 ENCOUNTER — Encounter: Payer: Self-pay | Admitting: Cardiology

## 2013-11-28 ENCOUNTER — Ambulatory Visit (INDEPENDENT_AMBULATORY_CARE_PROVIDER_SITE_OTHER): Payer: Commercial Managed Care - HMO | Admitting: Cardiology

## 2013-11-28 VITALS — BP 128/79 | HR 85 | Ht 70.0 in | Wt 175.0 lb

## 2013-11-28 DIAGNOSIS — I951 Orthostatic hypotension: Secondary | ICD-10-CM

## 2013-11-28 DIAGNOSIS — I4891 Unspecified atrial fibrillation: Secondary | ICD-10-CM

## 2013-11-28 DIAGNOSIS — I2510 Atherosclerotic heart disease of native coronary artery without angina pectoris: Secondary | ICD-10-CM

## 2013-11-28 NOTE — Patient Instructions (Signed)
The current medical regimen is effective;  continue present plan and medications.  Follow up in 1 year with Dr Hochrein.  You will receive a letter in the mail 2 months before you are due.  Please call us when you receive this letter to schedule your follow up appointment.  

## 2013-11-28 NOTE — Progress Notes (Signed)
HPI The patient presents for follow up of CAD.  He has a history of CAD s/p CABG two years ago.  Since I last saw him he has done well. The patient denies any new symptoms such as chest discomfort, neck or arm discomfort. There has been no new shortness of breath, PND or orthopnea. There has been no presyncope or syncope.  He does yard work and has had no symptoms.  He is in atrial fibrillation apparently persistentlyand he does not feel this.  Allergies  Allergen Reactions  . Sulfa Antibiotics     Current Outpatient Prescriptions  Medication Sig Dispense Refill  . atorvastatin (LIPITOR) 40 MG tablet Take 1 tablet (40 mg total) by mouth daily.  90 tablet  1  . cetirizine (ZYRTEC) 10 MG tablet Take 10 mg by mouth as needed.       . fluticasone (VERAMYST) 27.5 MCG/SPRAY nasal spray Place 2 sprays into the nose daily.      [name] ipratropium (ATROVENT) 0.06 % nasal spray       . metoprolol tartrate (LOPRESSOR) 25 MG tablet Take 1 tablet (25 mg total) by mouth 2 (two) times daily.  180 tablet  3  . Omega-3 Fatty Acids (FISH OIL PO) Take by mouth.        . triamcinolone ointment (TRIDERM) 0.1 % Apply 1 application topically 2 (two) times daily as needed.      . warfarin (COUMADIN) 5 MG tablet Take 1 tablet (5 mg total) by mouth daily.  30 tablet  1   No current facility-administered medications for this visit.    Past Medical History  Diagnosis Date  . Hypercholesteremia   . Hypertension   . CAD (coronary artery disease)   . Atrial fibrillation     peri op afib initially, then dx'd with permanent a-fib 2014 (xarelto started)  . Adenomatous colon polyp     Past Surgical History  Procedure Laterality Date  . Coronary artery bypass graft  2011    L - LAD, S - diag, S - PDA/LV branch 2011.  Nl EF  . Tonsillectomy and adenoidectomy  as a child  . Cataract extraction    . Inguinal hernia repair  1970s    right groin  . Colonoscopy w/ polypectomy  X 3, most recent 2011    Repeat due 2016    . Cardiovascular stress test  12/2012    Nuclear stress (exercise) testing: low risk scan    ROS:  Otherwise as stated in the HPI and negative for all other systems.  PHYSICAL EXAM BP 128/79  Pulse 85  Ht 5\' 10"  (1.778 m)  Wt 175 lb (79.379 kg)  BMI 25.11 kg/m2 GENERAL:  Well appearing NECK:  No jugular venous distention, waveform within normal limits, carotid upstroke brisk and symmetric, no bruits, no thyromegaly LYMPHATICS:  No cervical, inguinal adenopathy LUNGS:  Clear to auscultation bilaterally BACK:  No CVA tenderness CHEST:  Well healed sternotomy scar. HEART:  PMI not displaced or sustained,S1 and S2 within normal limits, no S3,  no clicks, no rubs, no murmurs, irregular ABD:  Flat, positive bowel sounds normal in frequency in pitch, no bruits, no rebound, no guarding, no midline pulsatile mass, no hepatomegaly, no splenomegaly EXT:  2 plus pulses throughout, no edema, no cyanosis no clubbing NEURO:  Cranial nerves II through XII grossly intact, motor grossly intact throughout   EKG:  Atrial fib, rate 76, left axis deviation, poor anterior all her progression, no acute ST-T wave  changes.  11/28/2013   ASSESSMENT AND PLAN  ATRIAL FIBRILLATION: he now has a symptomatic recurrence of this.  Mr. Amilcar Reever has a CHA2DS2 - VASc score of 4 with a risk of stroke of 4%  and a HAS - BLED score of 1 with a low risk of bleeding.  He could not or any of the NOACs so he is taking warfarin doing well with this. He has reasonable rate control. No change in therapy is indicated.  CAD:  He has had no symptoms since a stress perfusion study last year. No change in therapy is indicated.  HTN:  The blood pressure is at target. No change in medications is indicated. We will continue with therapeutic lifestyle changes (TLC).  HYPERLIPIDEMIA:  This is excellent and being managed by Dr. Laurance Flatten.  I will defer his management.

## 2013-12-03 ENCOUNTER — Other Ambulatory Visit (INDEPENDENT_AMBULATORY_CARE_PROVIDER_SITE_OTHER): Payer: Commercial Managed Care - HMO

## 2013-12-03 DIAGNOSIS — I4891 Unspecified atrial fibrillation: Secondary | ICD-10-CM

## 2013-12-03 LAB — PROTIME-INR
INR: 1.9 ratio — ABNORMAL HIGH (ref 0.8–1.0)
PROTHROMBIN TIME: 21.2 s — AB (ref 9.6–13.1)

## 2013-12-25 ENCOUNTER — Other Ambulatory Visit (INDEPENDENT_AMBULATORY_CARE_PROVIDER_SITE_OTHER): Payer: Commercial Managed Care - HMO

## 2013-12-25 ENCOUNTER — Telehealth: Payer: Self-pay

## 2013-12-25 DIAGNOSIS — I4891 Unspecified atrial fibrillation: Secondary | ICD-10-CM

## 2013-12-25 LAB — PROTIME-INR
INR: 1.9 ratio — AB (ref 0.8–1.0)
Prothrombin Time: 20.7 s — ABNORMAL HIGH (ref 9.6–13.1)

## 2013-12-25 NOTE — Telephone Encounter (Signed)
Pt would like a 90 day supply of Warfarin sent to his pharmacy. I drew his Pt/INR today so we should have the results tomorrow. Please advise.

## 2013-12-26 MED ORDER — WARFARIN SODIUM 5 MG PO TABS: ORAL_TABLET | ORAL | Status: DC

## 2013-12-26 NOTE — Telephone Encounter (Signed)
Coumadin sent to [facility].

## 2013-12-27 ENCOUNTER — Other Ambulatory Visit: Payer: Self-pay | Admitting: Cardiology

## 2013-12-27 NOTE — Telephone Encounter (Signed)
This needs to be sent to pcp, but I prefer for you to do that refusal. Thanks, MI

## 2014-01-10 ENCOUNTER — Other Ambulatory Visit: Payer: Commercial Managed Care - HMO

## 2014-01-10 ENCOUNTER — Ambulatory Visit (INDEPENDENT_AMBULATORY_CARE_PROVIDER_SITE_OTHER): Payer: Commercial Managed Care - HMO | Admitting: Family Medicine

## 2014-01-10 ENCOUNTER — Encounter: Payer: Self-pay | Admitting: Family Medicine

## 2014-01-10 VITALS — BP 128/63 | HR 59 | Temp 98.0°F | Resp 18 | Ht 68.0 in | Wt 172.0 lb

## 2014-01-10 DIAGNOSIS — T148 Other injury of unspecified body region: Secondary | ICD-10-CM

## 2014-01-10 DIAGNOSIS — W57XXXA Bitten or stung by nonvenomous insect and other nonvenomous arthropods, initial encounter: Secondary | ICD-10-CM

## 2014-01-10 DIAGNOSIS — I4891 Unspecified atrial fibrillation: Secondary | ICD-10-CM

## 2014-01-10 LAB — PROTIME-INR
INR: 2.8 ratio — ABNORMAL HIGH (ref 0.8–1.0)
Prothrombin Time: 29.8 s — ABNORMAL HIGH (ref 9.6–13.1)

## 2014-01-10 NOTE — Progress Notes (Signed)
OFFICE NOTE  01/10/2014  CC:  Chief Complaint  Patient presents with  . Tick Bite    on abdomen, leg   HPI: Patient is a 80 y.o. Caucasian male who is here for tick bites.  Has 3 tick bites from about 1 week ago. Denies any feeling of illness, specifically no HA, fever, myalgias, or rash.    Pertinent PMH:  Past medical, surgical, social, and family history reviewed and no changes are noted since last office visit.  MEDS:  Outpatient Prescriptions Prior to Visit  Medication Sig Dispense Refill  . atorvastatin (LIPITOR) 40 MG tablet Take 1 tablet (40 mg total) by mouth daily.  90 tablet  1  . cetirizine (ZYRTEC) 10 MG tablet Take 10 mg by mouth as needed.       . fluticasone (VERAMYST) 27.5 MCG/SPRAY nasal spray Place 2 sprays into the nose daily.      . metoprolol tartrate (LOPRESSOR) 25 MG tablet Take 1 tablet (25 mg total) by mouth 2 (two) times daily.  180 tablet  3  . Omega-3 Fatty Acids (FISH OIL PO) Take by mouth.        . triamcinolone ointment (TRIDERM) 0.1 % Apply 1 application topically 2 (two) times daily as needed.      . warfarin (COUMADIN) 5 MG tablet Take as directed by MD  118 tablet  4  . ipratropium (ATROVENT) 0.06 % nasal spray        No facility-administered medications prior to visit.    PE: Blood pressure 128/63, pulse 59, temperature 98 F (36.7 C), temperature source Temporal, resp. rate 18, height 5\' 8"  (1.727 m), weight 172 lb (78.019 kg), SpO2 99.00%. Gen: Alert, well appearing.  Patient is oriented to person, place, time, and situation. SKIN: left lower abd with small pink papule with scab.  No tenderness or surrounding erythema.  Two similar lesions on back of left leg.   IMPRESSION AND PLAN:  Tick bites: reassured pt that these rx's appear normal.   Apply otc hyrdocortisone ointment if itching.   Discussed signs and symptoms of tick-borne illness to watch for. Discussed use of insect repellant and nightly skin check for ticks. Reassured  pt.  FOLLOW UP: prn

## 2014-01-28 ENCOUNTER — Other Ambulatory Visit (INDEPENDENT_AMBULATORY_CARE_PROVIDER_SITE_OTHER): Payer: Commercial Managed Care - HMO

## 2014-01-28 ENCOUNTER — Telehealth: Payer: Self-pay | Admitting: Family Medicine

## 2014-01-28 DIAGNOSIS — I4891 Unspecified atrial fibrillation: Secondary | ICD-10-CM

## 2014-01-28 LAB — PROTIME-INR
INR: 2 ratio — AB (ref 0.8–1.0)
Prothrombin Time: 22 s — ABNORMAL HIGH (ref 9.6–13.1)

## 2014-01-28 MED ORDER — IPRATROPIUM BROMIDE 0.06 % NA SOLN: 2.0000 | Freq: Three times a day (TID) | NASAL | Status: DC

## 2014-01-28 NOTE — Telephone Encounter (Signed)
Rx sent through lab visit encounter.

## 2014-03-04 ENCOUNTER — Other Ambulatory Visit (INDEPENDENT_AMBULATORY_CARE_PROVIDER_SITE_OTHER): Payer: Commercial Managed Care - HMO

## 2014-03-04 DIAGNOSIS — I482 Chronic atrial fibrillation, unspecified: Secondary | ICD-10-CM

## 2014-03-04 DIAGNOSIS — I4891 Unspecified atrial fibrillation: Secondary | ICD-10-CM

## 2014-03-04 LAB — PROTIME-INR
INR: 2.5 ratio — ABNORMAL HIGH (ref 0.8–1.0)
Prothrombin Time: 27 s — ABNORMAL HIGH (ref 9.6–13.1)

## 2014-04-02 ENCOUNTER — Ambulatory Visit (INDEPENDENT_AMBULATORY_CARE_PROVIDER_SITE_OTHER): Payer: Commercial Managed Care - HMO | Admitting: Family Medicine

## 2014-04-02 ENCOUNTER — Encounter: Payer: Self-pay | Admitting: Family Medicine

## 2014-04-02 VITALS — BP 155/80 | HR 72 | Temp 98.0°F | Resp 18 | Ht 68.0 in | Wt 173.0 lb

## 2014-04-02 DIAGNOSIS — E785 Hyperlipidemia, unspecified: Secondary | ICD-10-CM

## 2014-04-02 DIAGNOSIS — Z0000 Encounter for general adult medical examination without abnormal findings: Secondary | ICD-10-CM

## 2014-04-02 DIAGNOSIS — Z23 Encounter for immunization: Secondary | ICD-10-CM

## 2014-04-02 DIAGNOSIS — D696 Thrombocytopenia, unspecified: Secondary | ICD-10-CM

## 2014-04-02 DIAGNOSIS — I482 Chronic atrial fibrillation, unspecified: Secondary | ICD-10-CM

## 2014-04-02 DIAGNOSIS — Z125 Encounter for screening for malignant neoplasm of prostate: Secondary | ICD-10-CM

## 2014-04-02 DIAGNOSIS — N400 Enlarged prostate without lower urinary tract symptoms: Secondary | ICD-10-CM

## 2014-04-02 DIAGNOSIS — I2510 Atherosclerotic heart disease of native coronary artery without angina pectoris: Secondary | ICD-10-CM

## 2014-04-02 DIAGNOSIS — I10 Essential (primary) hypertension: Secondary | ICD-10-CM

## 2014-04-02 DIAGNOSIS — I4891 Unspecified atrial fibrillation: Secondary | ICD-10-CM

## 2014-04-02 NOTE — Assessment & Plan Note (Addendum)
The current medical regimen is effective;  continue present plan and medications. Due for annual FLP today, also AST/ALT monitoring.

## 2014-04-02 NOTE — Assessment & Plan Note (Signed)
Prevnar 13 IM and Tdap booster today. Needs flu later this fall. He is otherwise fully UTD on vaccines.

## 2014-04-02 NOTE — Assessment & Plan Note (Signed)
The current medical regimen is effective;  continue present plan and medications. Lytes/Cr today. 

## 2014-04-02 NOTE — Assessment & Plan Note (Signed)
Pt with remote hx of nodule (felt again today), negative prostate biopsy, PSA's have always been low normal.  After discussion today of risks/benefits of PSA screening we decided that due to his age we would discontinue PSA screening (no check today).

## 2014-04-02 NOTE — Progress Notes (Signed)
Office Note 04/02/2014  CC:  Chief Complaint  Patient presents with  . Annual Exam    fasting   HPI:  Angel Ward is a 80 y.o. White male who is here for f/u chronic medical issues/physical exam/preventative health care. Feeling well, no acute complaints. He is active, does exercise bike regularly.  No CP, SOB, palpitations, or dizziness.  Has long hx of BPH: nocturia is his only symptom--no signif daytime sx's.  Hx of orthostatic hypotension with med trial, has basically modified fluid intake after supper since that time and can usually have an okay night.  Past Medical History  Diagnosis Date  . Hypercholesteremia   . Hypertension   . CAD (coronary artery disease)   . Atrial fibrillation     peri op afib initially, then dx'd with permanent a-fib 2014 (xarelto started, switched to coumadin for cost reasons)  . Adenomatous colon polyp     Past Surgical History  Procedure Laterality Date  . Coronary artery bypass graft  2011    L - LAD, S - diag, S - PDA/LV branch 2011.  Nl EF  . Tonsillectomy and adenoidectomy  as a child  . Cataract extraction    . Inguinal hernia repair  1970s    right groin  . Colonoscopy w/ polypectomy  X 3, most recent 2011    Repeat due 2016  . Cardiovascular stress test  12/2012    Nuclear stress (exercise) testing: low risk scan    Family History  Problem Relation Age of Onset  . Coronary artery disease Brother 73    History   Social History  . Marital Status: Married    Spouse Name: N/A    Number of Children: 64  . Years of Education: N/A   Occupational History  . Retired    Social History Main Topics  . Smoking status: Never Smoker   . Smokeless tobacco: Not on file  . Alcohol Use: Not on file  . Drug Use: Not on file  . Sexual Activity: Not on file   Other Topics Concern  . Not on file   Social History Narrative   Married, 5 children, 10 grandchildren.   Relocated to [place] from [place] in 2012 for warmer climate.   Orig from [place], [place].   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   Hobby: woodworking.   No T/A/Ds.   Exercise: treadmill and stationary bike--issue with knee temporarily got him out of routine--set to restart 03/2013.   Diet: prudent diet but nothing drastic.             Outpatient Prescriptions Prior to Visit  Medication Sig Dispense Refill  . atorvastatin (LIPITOR) 40 MG tablet Take 1 tablet (40 mg total) by mouth daily.  90 tablet  1  . cetirizine (ZYRTEC) 10 MG tablet Take 10 mg by mouth as needed.       [name] ipratropium (ATROVENT) 0.06 % nasal spray Place 2 sprays into both nostrils 3 (three) times daily.  15 mL  2  . metoprolol tartrate (LOPRESSOR) 25 MG tablet Take 1 tablet (25 mg total) by mouth 2 (two) times daily.  180 tablet  3  . Omega-3 Fatty Acids (FISH OIL PO) Take by mouth.        . triamcinolone ointment (TRIDERM) 0.1 % Apply 1 application topically 2 (two) times daily as needed.      . warfarin (COUMADIN) 5 MG tablet Take as directed by MD  118 tablet  4  .  fluticasone (VERAMYST) 27.5 MCG/SPRAY nasal spray Place 2 sprays into the nose daily.       No facility-administered medications prior to visit.    Allergies  Allergen Reactions  . Sulfa Antibiotics     ROS Review of Systems  Constitutional: Negative for fever, chills, appetite change and fatigue.  HENT: Negative for congestion, dental problem, ear pain and sore throat.   Eyes: Negative for discharge, redness and visual disturbance.  Respiratory: Negative for cough, chest tightness, shortness of breath and wheezing.   Cardiovascular: Negative for chest pain, palpitations and leg swelling.  Gastrointestinal: Negative for nausea, vomiting, abdominal pain, diarrhea and blood in stool.  Genitourinary: Negative for dysuria, urgency, frequency, hematuria, flank pain and difficulty urinating.       Nocturia x 2-5--chronic  Musculoskeletal: Negative for arthralgias, back pain, joint swelling,  myalgias and neck stiffness.  Skin: Negative for pallor and rash.  Neurological: Negative for dizziness, speech difficulty, weakness and headaches.  Hematological: Negative for adenopathy. Does not bruise/bleed easily.  Psychiatric/Behavioral: Negative for confusion and sleep disturbance. The patient is not nervous/anxious.     PE; Blood pressure 155/80, pulse 72, temperature 98 F (36.7 C), temperature source Temporal, resp. rate 18, height 5\' 8"  (1.727 m), weight 173 lb (78.472 kg), SpO2 97.00%. Gen: Alert, well appearing.  Patient is oriented to person, place, time, and situation. AFFECT: pleasant, lucid thought and speech. ENT: Ears: EACs clear, normal epithelium.  TMs with good light reflex and landmarks bilaterally.  Eyes: no injection, icteris, swelling, or exudate.  EOMI, PERRLA. Nose: no drainage or turbinate edema/swelling.  No injection or focal lesion.  Mouth: lips without lesion/swelling.  Oral mucosa pink and moist.  Dentition intact and without obvious caries or gingival swelling.  Oropharynx without erythema, exudate, or swelling.  Neck: supple/nontender.  No LAD, mass, or TM.  Carotid pulses 2+ bilaterally, without bruits. CV: RRR, no m/r/g.   LUNGS: CTA bilat, nonlabored resps, good aeration in all lung fields. ABD: soft, NT, ND, BS normal.  No hepatospenomegaly or mass.  No bruits. EXT: no clubbing, cyanosis, or edema.  Musculoskeletal: no joint swelling, erythema, warmth, or tenderness.  ROM of all joints intact. Skin - no sores or suspicious lesions or rashes or color changes Rectal exam: negative without mass, lesions or tenderness PROSTATE EXAM: smooth and symmetric without tenderness.  He has a bee-bee sized firm nodule that feels like it is coming from the far left anterior-most palpable portion of prostate gland.  He says this is exactly where they detected an abnormality years ago and after neg prostate bx it was labeled "scar".   Pertinent labs:  Lab Results   Component Value Date   INR 2.5* 03/04/2014   INR 2.0* 01/28/2014   INR 2.8* 01/10/2014     Chemistry      Component Value Date/Time   NA 142 05/24/2013 0922   K 4.7 05/24/2013 0922   CL 104 05/24/2013 0922   CO2 29 05/24/2013 0922   BUN 13 05/24/2013 0922   CREATININE 0.9 05/24/2013 0922      Component Value Date/Time   CALCIUM 9.3 05/24/2013 0922   ALKPHOS 52 03/20/2013 0905   AST 22 03/20/2013 0905   ALT 26 03/20/2013 0905   BILITOT 1.5* 03/20/2013 0905     Lab Results  Component Value Date   CHOL 124 03/20/2013   HDL 40.80 03/20/2013   LDLCALC 66 03/20/2013   TRIG 85.0 03/20/2013   CHOLHDL 3 03/20/2013   Lab Results  Component Value Date   WBC 6.7 03/20/2013   HGB 14.6 03/20/2013   HCT 43.8 03/20/2013   MCV 93.4 03/20/2013   PLT 132.0* 03/20/2013    ASSESSMENT AND PLAN:   Atrial fibrillation The current medical regimen is effective;  continue present plan and medications. PT/INR due today.  CAD (coronary artery disease) Asymptomatic/doing well. He is trying to restart regular CV exercise after having to stop this for a while due to a recent episode of knee pain.   Hypertension The current medical regimen is effective;  continue present plan and medications. Lytes/Cr today.  Hyperlipidemia The current medical regimen is effective;  continue present plan and medications. Due for annual FLP today, also AST/ALT monitoring.  BPH (benign prostatic hypertrophy) Symptomatic at night only. Hx of orthostatic hypotension---actually fell the morning after his first dose of a BPH med (he can't recall the name) in the past. We decided on no further med trials at this time.  He'll limit fluid intake after supper, esp caffeinated or sugary drinks.  Preventative health care Prevnar 13 IM and Tdap booster today. Needs flu later this fall. He is otherwise fully UTD on vaccines.  Prostate cancer screening Pt with remote hx of nodule (felt again today), negative prostate biopsy,  PSA's have always been low normal.  After discussion today of risks/benefits of PSA screening we decided that due to his age we would discontinue PSA screening (no check today).  An After Visit Summary was printed and given to the patient.  FOLLOW UP:  Return in about 6 months (around 10/01/2014) for routine chronic illness f/u (fasting not necessary).

## 2014-04-02 NOTE — Progress Notes (Signed)
Pre visit review using our clinic review tool, if applicable. No additional management support is needed unless otherwise documented below in the visit note. 

## 2014-04-02 NOTE — Assessment & Plan Note (Signed)
Asymptomatic/doing well. He is trying to restart regular CV exercise after having to stop this for a while due to a recent episode of knee pain.

## 2014-04-02 NOTE — Assessment & Plan Note (Signed)
The current medical regimen is effective;  continue present plan and medications. PT/INR due today.

## 2014-04-02 NOTE — Assessment & Plan Note (Addendum)
Symptomatic at night only. Hx of orthostatic hypotension---actually fell the morning after his first dose of a BPH med (he can't recall the name) in the past. We decided on no further med trials at this time.  He'll limit fluid intake after supper, esp caffeinated or sugary drinks.

## 2014-04-03 ENCOUNTER — Other Ambulatory Visit: Payer: Self-pay

## 2014-04-03 LAB — CBC WITH DIFFERENTIAL/PLATELET
BASOS PCT: 0.6 % (ref 0.0–3.0)
Basophils Absolute: 0.1 10*3/uL (ref 0.0–0.1)
EOS ABS: 0.3 10*3/uL (ref 0.0–0.7)
Eosinophils Relative: 3.8 % (ref 0.0–5.0)
HCT: 49.2 % (ref 39.0–52.0)
Hemoglobin: 16.6 g/dL (ref 13.0–17.0)
LYMPHS PCT: 15.1 % (ref 12.0–46.0)
Lymphs Abs: 1.3 10*3/uL (ref 0.7–4.0)
MCHC: 33.7 g/dL (ref 30.0–36.0)
MCV: 92.6 fl (ref 78.0–100.0)
Monocytes Absolute: 0.5 10*3/uL (ref 0.1–1.0)
Monocytes Relative: 6 % (ref 3.0–12.0)
NEUTROS PCT: 74.5 % (ref 43.0–77.0)
Neutro Abs: 6.5 10*3/uL (ref 1.4–7.7)
Platelets: 127 10*3/uL — ABNORMAL LOW (ref 150.0–400.0)
RBC: 5.32 Mil/uL (ref 4.22–5.81)
RDW: 13.7 % (ref 11.5–15.5)
WBC: 8.7 10*3/uL (ref 4.0–10.5)

## 2014-04-03 LAB — COMPREHENSIVE METABOLIC PANEL
ALT: 24 U/L (ref 0–53)
AST: 21 U/L (ref 0–37)
Albumin: 4.1 g/dL (ref 3.5–5.2)
Alkaline Phosphatase: 56 U/L (ref 39–117)
BUN: 12 mg/dL (ref 6–23)
CALCIUM: 9.7 mg/dL (ref 8.4–10.5)
CHLORIDE: 103 meq/L (ref 96–112)
CO2: 30 meq/L (ref 19–32)
CREATININE: 0.9 mg/dL (ref 0.4–1.5)
GFR: 85.6 mL/min (ref >60.00)
Glucose, Bld: 95 mg/dL (ref 70–99)
Potassium: 4.3 mEq/L (ref 3.5–5.1)
Sodium: 139 mEq/L (ref 135–145)
Total Bilirubin: 1.6 mg/dL — ABNORMAL HIGH (ref 0.2–1.2)
Total Protein: 6.4 g/dL (ref 6.0–8.3)

## 2014-04-03 LAB — LIPID PANEL
CHOL/HDL RATIO: 3
Cholesterol: 129 mg/dL (ref 0–200)
HDL: 38.5 mg/dL — AB (ref >39.00)
LDL Cholesterol: 68 mg/dL (ref 0–99)
NonHDL: 90.5
Triglycerides: 112 mg/dL (ref 0.0–149.0)
VLDL: 22.4 mg/dL (ref 0.0–40.0)

## 2014-04-03 LAB — PROTIME-INR
INR: 2.2 ratio — AB (ref 0.8–1.0)
PROTHROMBIN TIME: 23.9 s — AB (ref 9.6–13.1)

## 2014-04-03 MED ORDER — ATORVASTATIN CALCIUM 40 MG PO TABS: 40.0000 mg | ORAL_TABLET | Freq: Every day | ORAL | Status: DC

## 2014-04-04 ENCOUNTER — Encounter: Payer: Self-pay | Admitting: Family Medicine

## 2014-05-02 ENCOUNTER — Telehealth: Payer: Self-pay | Admitting: Family Medicine

## 2014-05-02 ENCOUNTER — Other Ambulatory Visit (INDEPENDENT_AMBULATORY_CARE_PROVIDER_SITE_OTHER): Payer: Commercial Managed Care - HMO

## 2014-05-02 DIAGNOSIS — I482 Chronic atrial fibrillation, unspecified: Secondary | ICD-10-CM

## 2014-05-02 LAB — PROTIME-INR
INR: 2.5 ratio — AB (ref 0.8–1.0)
Prothrombin Time: 26.6 s — ABNORMAL HIGH (ref 9.6–13.1)

## 2014-05-02 MED ORDER — METOPROLOL TARTRATE 25 MG PO TABS: 25.0000 mg | ORAL_TABLET | Freq: Two times a day (BID) | ORAL | Status: DC

## 2014-05-02 NOTE — Telephone Encounter (Signed)
Rx sent in to pharmacy. 

## 2014-06-10 ENCOUNTER — Ambulatory Visit: Payer: Commercial Managed Care - HMO

## 2014-06-10 ENCOUNTER — Encounter: Payer: Self-pay | Admitting: Family Medicine

## 2014-06-10 ENCOUNTER — Other Ambulatory Visit (INDEPENDENT_AMBULATORY_CARE_PROVIDER_SITE_OTHER): Payer: Medicare HMO

## 2014-06-10 ENCOUNTER — Ambulatory Visit (INDEPENDENT_AMBULATORY_CARE_PROVIDER_SITE_OTHER): Payer: Commercial Managed Care - HMO | Admitting: Family Medicine

## 2014-06-10 ENCOUNTER — Ambulatory Visit (INDEPENDENT_AMBULATORY_CARE_PROVIDER_SITE_OTHER): Payer: Medicare HMO | Admitting: Family Medicine

## 2014-06-10 VITALS — BP 140/71 | HR 63 | Temp 98.0°F | Resp 18 | Ht 68.0 in | Wt 175.0 lb

## 2014-06-10 DIAGNOSIS — I482 Chronic atrial fibrillation, unspecified: Secondary | ICD-10-CM

## 2014-06-10 DIAGNOSIS — Z23 Encounter for immunization: Secondary | ICD-10-CM

## 2014-06-10 DIAGNOSIS — M75101 Unspecified rotator cuff tear or rupture of right shoulder, not specified as traumatic: Secondary | ICD-10-CM

## 2014-06-10 DIAGNOSIS — M7541 Impingement syndrome of right shoulder: Secondary | ICD-10-CM

## 2014-06-10 LAB — PROTIME-INR
INR: 2.3 ratio — AB (ref 0.8–1.0)
Prothrombin Time: 24.6 s — ABNORMAL HIGH (ref 9.6–13.1)

## 2014-06-10 MED ORDER — METHYLPREDNISOLONE ACETATE 40 MG/ML IJ SUSP
40.0000 mg | Freq: Once | INTRAMUSCULAR | Status: AC
  Administered 2014-06-10: 40 mg via INTRA_ARTICULAR

## 2014-06-10 NOTE — Progress Notes (Signed)
OFFICE NOTE  06/10/2014  CC:  Chief Complaint  Patient presents with  . Shoulder Pain    Right shoulder    HPI: Patient is a 80 y.o. Caucasian male who is here for right shoulder pain.   Onset about 1 mo ago on top and front aspect of right shoulder.  No preceding trauma or overuse. Only hurts when he reaches out to side up.  No neck pain.   No paresthesias or weakness.  Tylenol and aspercream not effective. Doesn't hurt to lie on it in bed.    Pertinent PMH:  Past medical, surgical, social, and family history reviewed and no changes are noted since last office visit. + chronic coumadin use, hx of therapeuitc INR's MEDS:  Outpatient Prescriptions Prior to Visit  Medication Sig Dispense Refill  . atorvastatin (LIPITOR) 40 MG tablet Take 1 tablet (40 mg total) by mouth daily. 90 tablet 1  . cetirizine (ZYRTEC) 10 MG tablet Take 10 mg by mouth as needed.     . fluticasone (VERAMYST) 27.5 MCG/SPRAY nasal spray Place 2 sprays into the nose daily.    [name] ipratropium (ATROVENT) 0.06 % nasal spray Place 2 sprays into both nostrils 3 (three) times daily. 15 mL 2  . metoprolol tartrate (LOPRESSOR) 25 MG tablet Take 1 tablet (25 mg total) by mouth 2 (two) times daily. 180 tablet 1  . Omega-3 Fatty Acids (FISH OIL PO) Take by mouth.      . triamcinolone ointment (TRIDERM) 0.1 % Apply 1 application topically 2 (two) times daily as needed.    . warfarin (COUMADIN) 5 MG tablet Take as directed by MD 118 tablet 4   No facility-administered medications prior to visit.    PE: Blood pressure 140/71, pulse 63, temperature 98 F (36.7 C), temperature source Temporal, resp. rate 18, height 5\' 8"  (1.727 m), weight 175 lb (79.379 kg), SpO2 98 %. Gen: Alert, well appearing.  Patient is oriented to person, place, time, and situation. Neck: ROM intact, no tenderness. UE strength 5/5 prox and dist bilat. Right RC impingement sign +.   ER/IR and abduction against resistance and w/out resistance do not  bring pain today. No tenderness of AC joint or anywhere else on shoulder girdle.  IMPRESSION AND PLAN:  Right rotator cuff impingement. Pt not an NSAID candidate due to coumadin use. Discussed option of steroid injection into subacromial space and pt was agreeable to this.   Procedure: Therapeutic shoulder injection (right shoulder).  The patient's clinical condition is marked by substantial pain and/or significant functional disability.  Other conservative therapy has not provided relief, is contraindicated, or not appropriate.  There is a reasonable likelihood that injection will significantly improve the patient's pain and/or functional disability. Cleaned skin with alcohol swab, used posterolateral approach, Injected 1 ml of 40mg /ml depo medrol + 2 ml of 1% lidocaine w/out epi into subacromial space without resistance.  No immediate complications.  Patient tolerated procedure well.  Post-injection care discussed, including 20 min of icing 1-2 times in the next 4-8 hours, frequent non weight-bearing ROM exercises over the next few days, and general pain medication management.  An After Visit Summary was printed and given to the patient.  FOLLOW UP: prn

## 2014-06-10 NOTE — Progress Notes (Signed)
Pre visit review using our clinic review tool, if applicable. No additional management support is needed unless otherwise documented below in the visit note. 

## 2014-07-11 ENCOUNTER — Other Ambulatory Visit (INDEPENDENT_AMBULATORY_CARE_PROVIDER_SITE_OTHER): Payer: Commercial Managed Care - HMO

## 2014-07-11 DIAGNOSIS — I482 Chronic atrial fibrillation, unspecified: Secondary | ICD-10-CM

## 2014-07-11 LAB — PROTIME-INR
INR: 1.6 ratio — ABNORMAL HIGH (ref 0.8–1.0)
Prothrombin Time: 17.7 s — ABNORMAL HIGH (ref 9.6–13.1)

## 2014-07-23 ENCOUNTER — Other Ambulatory Visit (INDEPENDENT_AMBULATORY_CARE_PROVIDER_SITE_OTHER): Payer: Medicare HMO

## 2014-07-23 ENCOUNTER — Other Ambulatory Visit: Payer: Self-pay

## 2014-07-23 DIAGNOSIS — I482 Chronic atrial fibrillation, unspecified: Secondary | ICD-10-CM

## 2014-07-23 LAB — PROTIME-INR
INR: 2.4 ratio — AB (ref 0.8–1.0)
PROTHROMBIN TIME: 25.8 s — AB (ref 9.6–13.1)

## 2014-07-23 MED ORDER — ATORVASTATIN CALCIUM 40 MG PO TABS: 40.0000 mg | ORAL_TABLET | Freq: Every day | ORAL | Status: DC

## 2014-07-23 MED ORDER — METOPROLOL TARTRATE 25 MG PO TABS: 25.0000 mg | ORAL_TABLET | Freq: Two times a day (BID) | ORAL | Status: DC

## 2014-07-23 MED ORDER — IPRATROPIUM BROMIDE 0.06 % NA SOLN: 2.0000 | Freq: Three times a day (TID) | NASAL | Status: DC

## 2014-08-12 ENCOUNTER — Other Ambulatory Visit (INDEPENDENT_AMBULATORY_CARE_PROVIDER_SITE_OTHER): Payer: Commercial Managed Care - HMO

## 2014-08-12 DIAGNOSIS — I482 Chronic atrial fibrillation, unspecified: Secondary | ICD-10-CM

## 2014-08-12 LAB — PROTIME-INR
INR: 2.2 ratio — ABNORMAL HIGH (ref 0.8–1.0)
Prothrombin Time: 24.1 s — ABNORMAL HIGH (ref 9.6–13.1)

## 2014-09-12 ENCOUNTER — Other Ambulatory Visit (INDEPENDENT_AMBULATORY_CARE_PROVIDER_SITE_OTHER): Payer: Commercial Managed Care - HMO

## 2014-09-12 DIAGNOSIS — I482 Chronic atrial fibrillation, unspecified: Secondary | ICD-10-CM

## 2014-09-12 LAB — PROTIME-INR
INR: 3.1 ratio — ABNORMAL HIGH (ref 0.8–1.0)
PROTHROMBIN TIME: 33.6 s — AB (ref 9.6–13.1)

## 2014-10-10 ENCOUNTER — Other Ambulatory Visit (INDEPENDENT_AMBULATORY_CARE_PROVIDER_SITE_OTHER): Payer: Commercial Managed Care - HMO

## 2014-10-10 DIAGNOSIS — I482 Chronic atrial fibrillation, unspecified: Secondary | ICD-10-CM

## 2014-10-10 LAB — PROTIME-INR
INR: 2.6 ratio — ABNORMAL HIGH (ref 0.8–1.0)
Prothrombin Time: 28.1 s — ABNORMAL HIGH (ref 9.6–13.1)

## 2014-11-11 ENCOUNTER — Other Ambulatory Visit: Payer: Medicare HMO

## 2014-11-11 ENCOUNTER — Encounter: Payer: Self-pay | Admitting: Family Medicine

## 2014-11-11 ENCOUNTER — Ambulatory Visit (INDEPENDENT_AMBULATORY_CARE_PROVIDER_SITE_OTHER): Payer: Commercial Managed Care - HMO | Admitting: Family Medicine

## 2014-11-11 VITALS — BP 128/64 | HR 69 | Temp 98.2°F | Ht 68.0 in | Wt 177.0 lb

## 2014-11-11 DIAGNOSIS — H547 Unspecified visual loss: Secondary | ICD-10-CM | POA: Diagnosis not present

## 2014-11-11 DIAGNOSIS — I482 Chronic atrial fibrillation, unspecified: Secondary | ICD-10-CM

## 2014-11-11 DIAGNOSIS — L989 Disorder of the skin and subcutaneous tissue, unspecified: Secondary | ICD-10-CM | POA: Diagnosis not present

## 2014-11-11 DIAGNOSIS — J302 Other seasonal allergic rhinitis: Secondary | ICD-10-CM | POA: Diagnosis not present

## 2014-11-11 LAB — PROTIME-INR
INR: 1.9 ratio — ABNORMAL HIGH (ref 0.8–1.0)
Prothrombin Time: 21.2 s — ABNORMAL HIGH (ref 9.6–13.1)

## 2014-11-11 MED ORDER — FLUTICASONE PROPIONATE 50 MCG/ACT NA SUSP: 2.0000 | Freq: Every day | NASAL | Status: DC

## 2014-11-11 NOTE — Progress Notes (Signed)
Pre visit review using our clinic review tool, if applicable. No additional management support is needed unless otherwise documented below in the visit note. 

## 2014-11-11 NOTE — Progress Notes (Signed)
OFFICE NOTE  11/11/2014  CC:  Chief Complaint  Patient presents with  . Allergies     HPI: Patient is a 81 y.o. Caucasian male who is here for "nasal allergies". Couple weeks of nasal congestion, sneezing, some runny/itchy eyes, feeling of fullness in sinuses. No cough or SOB or wheezing.  No fevers, no ST, no face pain or upper teeth pain.  No ear symptoms. Using saline nasal spray and breath rite strips at night.  Also says he is due for PT/INR check for being on coumadin for chronic a-fib.  Also has concern of skin spot on right temple region , has been there for years but is enlarging.  He also has hx of chronic vision impairment, asks for referral to ophthalm, Dr. Prudencio Burly at [facility] ophthalmology.   Pertinent PMH:  Past medical, surgical, social, and family history reviewed and no changes are noted since last office visit.  MEDS: Not taking veramyst or atrovent nasal spray listed below Outpatient Prescriptions Prior to Visit  Medication Sig Dispense Refill  . atorvastatin (LIPITOR) 40 MG tablet Take 1 tablet (40 mg total) by mouth daily. 90 tablet 1  . cetirizine (ZYRTEC) 10 MG tablet Take 10 mg by mouth as needed.     . metoprolol tartrate (LOPRESSOR) 25 MG tablet Take 1 tablet (25 mg total) by mouth 2 (two) times daily. 180 tablet 1  . Omega-3 Fatty Acids (FISH OIL PO) Take by mouth.      . triamcinolone ointment (TRIDERM) 0.1 % Apply 1 application topically 2 (two) times daily as needed.    . warfarin (COUMADIN) 5 MG tablet Take as directed by MD 118 tablet 4  . fluticasone (VERAMYST) 27.5 MCG/SPRAY nasal spray Place 2 sprays into the nose daily.    [name] ipratropium (ATROVENT) 0.06 % nasal spray Place 2 sprays into both nostrils 3 (three) times daily. (Patient not taking: Reported on 11/11/2014) 15 mL 2   No facility-administered medications prior to visit.    PE: Blood pressure 128/64, pulse 69, temperature 98.2 F (36.8 C), temperature source Oral, height 5\' 8"  (1.727 m),  weight 177 lb (80.287 kg), SpO2 96 %. VS: noted--normal. Gen: alert, NAD, NONTOXIC APPEARING. HEENT: eyes without injection, drainage, or swelling.  Ears: EACs clear, TMs with normal light reflex and landmarks.  Nose: Clear rhinorrhea, with some dried, crusty exudate adherent to mildly injected mucosa.  No purulent d/c.  No paranasal sinus TTP.  No facial swelling.  Throat and mouth without focal lesion.  No pharyngial swelling, erythema, or exudate.   Neck: supple, no LAD.   LUNGS: CTA bilat, nonlabored resps.   CV: Irreg irreg, rate 70s, no m/r. EXT: no c/c/e SKIN: R temple with oblong shaped, waxy-crusted papular lesion about 4 mm in oblong length, 3 mm cranio-caudal measurement, light brown pigment.   LABS:  Lab Results  Component Value Date   INR 2.6* 10/10/2014   INR 3.1* 09/12/2014   INR 2.2* 08/12/2014    IMPRESSION AND PLAN:  1) Seasonal allergic rhinitis: continue zyrtec 10mg  qd, which he just started 2 d/a. Restart nasal steroid: flonase generic, continue saline nasal spray.  2) R temple skin lesion, c/w appearance of seborrheic keratosis but will ask derm to see to confirm and excise if not clear to them.  3) Impaired vision, chronic--pt desired ophth referral so I referred him to Dr. Prudencio Burly at [facility] per his request.  4) Chronic a-fib: The current medical regimen is effective;  continue present plan and medications.  PT/INR check today.  An After Visit Summary was printed and given to the patient.  FOLLOW UP: 6 mo for fasting CPE

## 2014-12-03 DIAGNOSIS — L820 Inflamed seborrheic keratosis: Secondary | ICD-10-CM | POA: Diagnosis not present

## 2014-12-03 DIAGNOSIS — L300 Nummular dermatitis: Secondary | ICD-10-CM | POA: Diagnosis not present

## 2014-12-09 ENCOUNTER — Other Ambulatory Visit (INDEPENDENT_AMBULATORY_CARE_PROVIDER_SITE_OTHER): Payer: Commercial Managed Care - HMO

## 2014-12-09 DIAGNOSIS — I4891 Unspecified atrial fibrillation: Secondary | ICD-10-CM | POA: Diagnosis not present

## 2014-12-09 DIAGNOSIS — H35371 Puckering of macula, right eye: Secondary | ICD-10-CM | POA: Diagnosis not present

## 2014-12-09 DIAGNOSIS — Z961 Presence of intraocular lens: Secondary | ICD-10-CM | POA: Diagnosis not present

## 2014-12-09 DIAGNOSIS — I482 Chronic atrial fibrillation, unspecified: Secondary | ICD-10-CM

## 2014-12-10 ENCOUNTER — Other Ambulatory Visit: Payer: Medicare HMO

## 2014-12-10 LAB — PROTIME-INR
INR: 2.13 — ABNORMAL HIGH (ref <1.50)
Prothrombin Time: 23.8 seconds — ABNORMAL HIGH (ref 11.6–15.2)

## 2014-12-11 ENCOUNTER — Encounter: Payer: Self-pay | Admitting: Cardiology

## 2014-12-11 ENCOUNTER — Ambulatory Visit (INDEPENDENT_AMBULATORY_CARE_PROVIDER_SITE_OTHER): Payer: Commercial Managed Care - HMO | Admitting: Cardiology

## 2014-12-11 VITALS — BP 124/78 | HR 72 | Ht 70.0 in | Wt 179.0 lb

## 2014-12-11 DIAGNOSIS — I482 Chronic atrial fibrillation, unspecified: Secondary | ICD-10-CM

## 2014-12-11 DIAGNOSIS — I10 Essential (primary) hypertension: Secondary | ICD-10-CM

## 2014-12-11 NOTE — Patient Instructions (Signed)
Your physician recommends that you continue on your current medications as directed. Please refer to the Current Medication list given to you today.  Follow up in 1 year with Dr. Percival Spanish in [place].  You will receive a letter in the mail 2 months before you are due.  Please call us when you receive this letter to schedule your follow up appointment.  Thank you for choosing [facility]!!

## 2014-12-11 NOTE — Progress Notes (Signed)
HPI The patient presents for follow up of CAD.  He has a history of CAD s/p CABG.  He has permanent atrial fib.   Since I last saw him he has done well. The patient denies any new symptoms such as chest discomfort, neck or arm discomfort. There has been no new shortness of breath, PND or orthopnea. There has been no presyncope or syncope.  He does yard work and has had no symptoms.  He remains very active traveling and working in his yard.   Allergies  Allergen Reactions  . Sulfa Antibiotics     Current Outpatient Prescriptions  Medication Sig Dispense Refill  . atorvastatin (LIPITOR) 40 MG tablet Take 1 tablet (40 mg total) by mouth daily. 90 tablet 1  . cetirizine (ZYRTEC) 10 MG tablet Take 10 mg by mouth as needed.     . fluticasone (FLONASE) 50 MCG/ACT nasal spray Place 2 sprays into both nostrils daily. 48 g 3  . ipratropium (ATROVENT) 0.06 % nasal spray Place 2 sprays into both nostrils 3 (three) times daily. 15 mL 2  . metoprolol tartrate (LOPRESSOR) 25 MG tablet Take 1 tablet (25 mg total) by mouth 2 (two) times daily. 180 tablet 1  . Omega-3 Fatty Acids (FISH OIL PO) Take by mouth.      . triamcinolone ointment (TRIDERM) 0.1 % Apply 1 application topically 2 (two) times daily as needed.    . warfarin (COUMADIN) 5 MG tablet Take as directed by MD 118 tablet 4   No current facility-administered medications for this visit.    Past Medical History  Diagnosis Date  . Hypercholesteremia   . Hypertension   . CAD (coronary artery disease)   . Atrial fibrillation     peri op afib initially, then dx'd with permanent a-fib 2014 (xarelto started, switched to coumadin for cost reasons)  . Adenomatous colon polyp   . Chronic idiopathic thrombocytopenia     very mild  . Allergic rhinitis     primarily seasonal  . Hearing impairment     AU; has hearing aids    Past Surgical History  Procedure Laterality Date  . Coronary artery bypass graft  2011    L - LAD, S - diag, S - PDA/LV  branch 2011.  Nl EF  . Tonsillectomy and adenoidectomy  as a child  . Cataract extraction    . Inguinal hernia repair  1970s    right groin  . Colonoscopy w/ polypectomy  X 3, most recent 2011    Repeat due 2016  . Cardiovascular stress test  12/2012    Nuclear stress (exercise) testing: low risk scan    ROS:  As stated in the HPI and negative for all other systems.  PHYSICAL EXAM BP 124/78 mmHg  Pulse 72  Ht 5\' 10"  (1.778 m)  Wt 179 lb (81.194 kg)  BMI 25.68 kg/m2 GENERAL:  Well appearing NECK:  No jugular venous distention, waveform within normal limits, carotid upstroke brisk and symmetric, no bruits, no thyromegalythy LUNGS:  Clear to auscultation bilaterally CHEST:  Well healed sternotomy scar. HEART:  PMI not displaced or sustained,S1 and S2 within normal limits, no S3,  no clicks, no rubs, no murmurs, irregular ABD:  Flat, positive bowel sounds normal in frequency in pitch, no bruits, no rebound, no guarding, no midline pulsatile mass, no hepatomegaly, no splenomegaly EXT:  2 plus pulses throughout, no edema, no cyanosis no clubbing   EKG:  Atrial fib, rate 72, poor anterior all her  progression, no acute ST-T wave changes.  12/11/2014   ASSESSMENT AND PLAN  ATRIAL FIBRILLATION:   The patient  tolerates this rhythm and rate control and anticoagulation. We will continue with the meds as listed.  [name] has a CHA2DS2 - VASc score of 4 with a risk of stroke of 4%.  CAD:  He has had no symptoms since a stress perfusion study in 2014. No change in therapy is indicated.  HTN:  The blood pressure is at target. No change in medications is indicated. We will continue with therapeutic lifestyle changes (TLC).  HYPERLIPIDEMIA:  This is excellent and being managed by Tammi Sou, MD.  His LDL was 68 in Sept of last year.

## 2015-01-22 ENCOUNTER — Other Ambulatory Visit (INDEPENDENT_AMBULATORY_CARE_PROVIDER_SITE_OTHER): Payer: Commercial Managed Care - HMO

## 2015-01-22 DIAGNOSIS — I482 Chronic atrial fibrillation, unspecified: Secondary | ICD-10-CM

## 2015-01-22 LAB — PROTIME-INR
INR: 2.4 ratio — ABNORMAL HIGH (ref 0.8–1.0)
Prothrombin Time: 25.7 s — ABNORMAL HIGH (ref 9.6–13.1)

## 2015-01-27 ENCOUNTER — Other Ambulatory Visit: Payer: Self-pay | Admitting: Family Medicine

## 2015-01-27 NOTE — Telephone Encounter (Signed)
RF request for Warfarin 5mg  LOV: 11/11/14 Next ov: None Last written: 12/26/13 #118 w/ 4RF

## 2015-02-25 ENCOUNTER — Other Ambulatory Visit (INDEPENDENT_AMBULATORY_CARE_PROVIDER_SITE_OTHER): Payer: Commercial Managed Care - HMO

## 2015-02-25 DIAGNOSIS — I482 Chronic atrial fibrillation, unspecified: Secondary | ICD-10-CM

## 2015-02-25 LAB — PROTIME-INR
INR: 2.6 ratio — ABNORMAL HIGH (ref 0.8–1.0)
Prothrombin Time: 28.1 s — ABNORMAL HIGH (ref 9.6–13.1)

## 2015-02-26 NOTE — Addendum Note (Signed)
Addended by: Lanae Crumbly on: 02/26/2015 09:11 AM   Modules accepted: Orders

## 2015-03-12 ENCOUNTER — Telehealth: Payer: Self-pay | Admitting: Family Medicine

## 2015-03-12 MED ORDER — IPRATROPIUM BROMIDE 0.06 % NA SOLN: 2.0000 | Freq: Three times a day (TID) | NASAL | Status: DC

## 2015-03-12 NOTE — Telephone Encounter (Signed)
Pt is requesting a refill on his Ipratropium Nasal Spray. He needs it sent to [facility]. In [place].

## 2015-03-12 NOTE — Telephone Encounter (Signed)
RF request for atrovent LOV: 11/11/14 Next ov: 05/26/15 Last written: 07/23/14 54ml w/ 2RF

## 2015-03-28 ENCOUNTER — Telehealth: Payer: Self-pay | Admitting: Family Medicine

## 2015-03-28 ENCOUNTER — Other Ambulatory Visit (INDEPENDENT_AMBULATORY_CARE_PROVIDER_SITE_OTHER): Payer: Commercial Managed Care - HMO

## 2015-03-28 ENCOUNTER — Other Ambulatory Visit: Payer: Self-pay | Admitting: *Deleted

## 2015-03-28 DIAGNOSIS — I482 Chronic atrial fibrillation, unspecified: Secondary | ICD-10-CM

## 2015-03-28 DIAGNOSIS — I4891 Unspecified atrial fibrillation: Secondary | ICD-10-CM

## 2015-03-28 LAB — PROTIME-INR
INR: 2.3 ratio — ABNORMAL HIGH (ref 0.8–1.0)
Prothrombin Time: 24.9 s — ABNORMAL HIGH (ref 9.6–13.1)

## 2015-03-28 NOTE — Telephone Encounter (Signed)
Patient will be out of town next week. Please call his cell not his home # with his results.

## 2015-03-28 NOTE — Telephone Encounter (Signed)
FYI

## 2015-03-31 ENCOUNTER — Other Ambulatory Visit: Payer: Commercial Managed Care - HMO

## 2015-04-28 ENCOUNTER — Other Ambulatory Visit (INDEPENDENT_AMBULATORY_CARE_PROVIDER_SITE_OTHER): Payer: Commercial Managed Care - HMO

## 2015-04-28 DIAGNOSIS — I4891 Unspecified atrial fibrillation: Secondary | ICD-10-CM | POA: Diagnosis not present

## 2015-04-28 LAB — PROTIME-INR
INR: 2.4 ratio — ABNORMAL HIGH (ref 0.8–1.0)
Prothrombin Time: 25.7 s — ABNORMAL HIGH (ref 9.6–13.1)

## 2015-04-29 NOTE — Addendum Note (Signed)
Addended by: Lanae Crumbly on: 04/29/2015 11:32 AM   Modules accepted: Orders

## 2015-05-10 ENCOUNTER — Other Ambulatory Visit: Payer: Self-pay | Admitting: Family Medicine

## 2015-05-26 ENCOUNTER — Other Ambulatory Visit (INDEPENDENT_AMBULATORY_CARE_PROVIDER_SITE_OTHER): Payer: Commercial Managed Care - HMO

## 2015-05-26 ENCOUNTER — Encounter: Payer: Commercial Managed Care - HMO | Admitting: Family Medicine

## 2015-05-26 DIAGNOSIS — Z23 Encounter for immunization: Secondary | ICD-10-CM | POA: Diagnosis not present

## 2015-05-26 DIAGNOSIS — Z125 Encounter for screening for malignant neoplasm of prostate: Secondary | ICD-10-CM | POA: Diagnosis not present

## 2015-05-26 DIAGNOSIS — I4891 Unspecified atrial fibrillation: Secondary | ICD-10-CM | POA: Diagnosis not present

## 2015-05-26 DIAGNOSIS — Z0000 Encounter for general adult medical examination without abnormal findings: Secondary | ICD-10-CM

## 2015-05-26 LAB — CBC WITH DIFFERENTIAL/PLATELET
BASOS PCT: 0.6 % (ref 0.0–3.0)
Basophils Absolute: 0 10*3/uL (ref 0.0–0.1)
EOS PCT: 7.1 % — AB (ref 0.0–5.0)
Eosinophils Absolute: 0.5 10*3/uL (ref 0.0–0.7)
HEMATOCRIT: 48.7 % (ref 39.0–52.0)
Hemoglobin: 16.1 g/dL (ref 13.0–17.0)
LYMPHS ABS: 1.5 10*3/uL (ref 0.7–4.0)
LYMPHS PCT: 21.7 % (ref 12.0–46.0)
MCHC: 33.1 g/dL (ref 30.0–36.0)
MCV: 93.1 fl (ref 78.0–100.0)
MONOS PCT: 6.3 % (ref 3.0–12.0)
Monocytes Absolute: 0.4 10*3/uL (ref 0.1–1.0)
NEUTROS ABS: 4.5 10*3/uL (ref 1.4–7.7)
Neutrophils Relative %: 64.3 % (ref 43.0–77.0)
PLATELETS: 134 10*3/uL — AB (ref 150.0–400.0)
RBC: 5.23 Mil/uL (ref 4.22–5.81)
RDW: 13.8 % (ref 11.5–15.5)
WBC: 7 10*3/uL (ref 4.0–10.5)

## 2015-05-26 LAB — COMPREHENSIVE METABOLIC PANEL
ALT: 24 U/L (ref 0–53)
AST: 20 U/L (ref 0–37)
Albumin: 4.2 g/dL (ref 3.5–5.2)
Alkaline Phosphatase: 49 U/L (ref 39–117)
BUN: 12 mg/dL (ref 6–23)
CHLORIDE: 105 meq/L (ref 96–112)
CO2: 30 meq/L (ref 19–32)
CREATININE: 0.87 mg/dL (ref 0.40–1.50)
Calcium: 9.6 mg/dL (ref 8.4–10.5)
GFR: 89.89 mL/min (ref >60.00)
Glucose, Bld: 110 mg/dL — ABNORMAL HIGH (ref 70–99)
Potassium: 4.6 mEq/L (ref 3.5–5.1)
Sodium: 142 mEq/L (ref 135–145)
Total Bilirubin: 1.3 mg/dL — ABNORMAL HIGH (ref 0.2–1.2)
Total Protein: 6.2 g/dL (ref 6.0–8.3)

## 2015-05-26 LAB — TSH: TSH: 1.99 u[IU]/mL (ref 0.35–4.50)

## 2015-05-26 LAB — LIPID PANEL
Cholesterol: 133 mg/dL (ref 0–200)
HDL: 43.3 mg/dL (ref >39.00)
LDL Cholesterol: 70 mg/dL (ref 0–99)
NONHDL: 89.62
Total CHOL/HDL Ratio: 3
Triglycerides: 100 mg/dL (ref 0.0–149.0)
VLDL: 20 mg/dL (ref 0.0–40.0)

## 2015-05-26 LAB — PSA, MEDICARE: PSA: 1.43 ng/mL (ref 0.10–4.00)

## 2015-05-26 LAB — PROTIME-INR
INR: 2.3 ratio — AB (ref 0.8–1.0)
PROTHROMBIN TIME: 25.4 s — AB (ref 9.6–13.1)

## 2015-05-27 ENCOUNTER — Encounter: Payer: Self-pay | Admitting: Family Medicine

## 2015-05-27 ENCOUNTER — Other Ambulatory Visit (INDEPENDENT_AMBULATORY_CARE_PROVIDER_SITE_OTHER): Payer: Commercial Managed Care - HMO

## 2015-05-27 DIAGNOSIS — R7301 Impaired fasting glucose: Secondary | ICD-10-CM

## 2015-05-27 LAB — HEMOGLOBIN A1C: Hgb A1c MFr Bld: 5.8 % (ref 4.6–6.5)

## 2015-05-28 ENCOUNTER — Other Ambulatory Visit: Payer: Self-pay | Admitting: *Deleted

## 2015-05-28 DIAGNOSIS — I4891 Unspecified atrial fibrillation: Secondary | ICD-10-CM

## 2015-06-05 ENCOUNTER — Encounter: Payer: Self-pay | Admitting: Family Medicine

## 2015-06-05 ENCOUNTER — Ambulatory Visit (INDEPENDENT_AMBULATORY_CARE_PROVIDER_SITE_OTHER): Payer: Commercial Managed Care - HMO | Admitting: Family Medicine

## 2015-06-05 ENCOUNTER — Other Ambulatory Visit: Payer: Self-pay | Admitting: *Deleted

## 2015-06-05 VITALS — BP 122/76 | HR 55 | Temp 98.1°F | Resp 16 | Ht 67.5 in | Wt 173.0 lb

## 2015-06-05 DIAGNOSIS — N401 Enlarged prostate with lower urinary tract symptoms: Secondary | ICD-10-CM | POA: Diagnosis not present

## 2015-06-05 DIAGNOSIS — D126 Benign neoplasm of colon, unspecified: Secondary | ICD-10-CM

## 2015-06-05 DIAGNOSIS — R7301 Impaired fasting glucose: Secondary | ICD-10-CM

## 2015-06-05 DIAGNOSIS — N138 Other obstructive and reflux uropathy: Secondary | ICD-10-CM

## 2015-06-05 DIAGNOSIS — Z0000 Encounter for general adult medical examination without abnormal findings: Secondary | ICD-10-CM | POA: Diagnosis not present

## 2015-06-05 DIAGNOSIS — N5201 Erectile dysfunction due to arterial insufficiency: Secondary | ICD-10-CM | POA: Diagnosis not present

## 2015-06-05 DIAGNOSIS — Z125 Encounter for screening for malignant neoplasm of prostate: Secondary | ICD-10-CM

## 2015-06-05 MED ORDER — TADALAFIL 5 MG PO TABS: 5.0000 mg | ORAL_TABLET | Freq: Every day | ORAL | Status: DC | PRN

## 2015-06-05 NOTE — Progress Notes (Signed)
Office Note 06/05/2015  CC:  Chief Complaint  Patient presents with  . Annual Exam    Labs done 05/26/15    HPI:  Angel SHURLEY Sr. is a 81 y.o. White male who is here for his annual health maintenance exam. Discussed recent labs in detail: mild IFG.  Discussed carb limiting, which he is familiar with b/c wife is diabetic. [place] state [place]), + polyps, needs repeat by local GI MD.  Home bp monitoring normal per pt.  Has long hx of poor erectile function, cannot get firm enough for penetration most of the time. Tried viagra in the past w/out success, tried cialis 5mg  qd for only a few doses (samples given) so he is unsure if this really helps him.  He does have some daytime frequency but mostly nocturia up to 4-6 per night--as far as BPH sx's go.    Past Medical History  Diagnosis Date  . Hypercholesteremia   . Hypertension   . CAD (coronary artery disease)   . Atrial fibrillation ([place])     peri op afib initially, then dx'd with permanent a-fib 2014 (xarelto started, switched to coumadin for cost reasons)  . Adenomatous colon polyp   . Chronic idiopathic thrombocytopenia ([facility])     very mild  . Allergic rhinitis     primarily seasonal  . Hearing impairment     AU; has hearing aids  . IFG (impaired fasting glucose) 05/2015    A1c 5.8%    Past Surgical History  Procedure Laterality Date  . Coronary artery bypass graft  2011    L - LAD, S - diag, S - PDA/LV branch 2011.  Nl EF  . Tonsillectomy and adenoidectomy  as a child  . Cataract extraction    . Inguinal hernia repair  1970s    right groin  . Colonoscopy w/ polypectomy  X 3, most recent 2011    Repeat due 2016  . Cardiovascular stress test  12/2012    Nuclear stress (exercise) testing: low risk scan    Family History  Problem Relation Age of Onset  . Coronary artery disease Brother 50  . Stroke Mother   . Pneumonia Father     Social History   Social History  . Marital  Status: Married    Spouse Name: N/A  . Number of Children: 5  . Years of Education: N/A   Occupational History  . Retired    Social History Main Topics  . Smoking status: Never Smoker   . Smokeless tobacco: Not on file  . Alcohol Use: Not on file  . Drug Use: Not on file  . Sexual Activity: Not on file   Other Topics Concern  . Not on file   Social History Narrative   Married, 5 children, 10 grandchildren.   Relocated to [place] from [place] in 2012 for warmer climate.  Orig from [place], [place].   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   Hobby: woodworking.   No T/A/Ds.   Exercise: treadmill and stationary bike--issue with knee temporarily got him out of routine--set to restart 03/2013.   Diet: prudent diet but nothing drastic.             Outpatient Prescriptions Prior to Visit  Medication Sig Dispense Refill  . atorvastatin (LIPITOR) 40 MG tablet TAKE 1 TABLET EVERY DAY 90 tablet 1  . cetirizine (ZYRTEC) 10 MG tablet Take 10 mg by mouth as needed.     [name]  fluticasone (FLONASE) 50 MCG/ACT nasal spray Place 2 sprays into both nostrils daily. 48 g 3  . ipratropium (ATROVENT) 0.06 % nasal spray Place 2 sprays into both nostrils 3 (three) times daily. 15 mL 1  . metoprolol tartrate (LOPRESSOR) 25 MG tablet TAKE 1 TABLET TWICE DAILY 180 tablet 1  . Omega-3 Fatty Acids (FISH OIL PO) Take by mouth.      . triamcinolone ointment (TRIDERM) 0.1 % Apply 1 application topically 2 (two) times daily as needed.    . warfarin (COUMADIN) 5 MG tablet TAKE AS DIRECTED BY MD 118 tablet 4   No facility-administered medications prior to visit.    Allergies  Allergen Reactions  . Sulfa Antibiotics     ROS Review of Systems  Constitutional: Negative for fever, chills, appetite change and fatigue.  HENT: Negative for congestion, dental problem, ear pain and sore throat.   Eyes: Negative for discharge, redness and visual disturbance.  Respiratory: Negative for cough,  chest tightness, shortness of breath and wheezing.   Cardiovascular: Negative for chest pain, palpitations and leg swelling.  Gastrointestinal: Negative for nausea, vomiting, abdominal pain, diarrhea and blood in stool.  Genitourinary: Negative for dysuria, urgency, hematuria, flank pain and difficulty urinating.  Musculoskeletal: Negative for myalgias, back pain, joint swelling, arthralgias and neck stiffness.  Skin: Negative for pallor and rash.  Neurological: Negative for dizziness, speech difficulty, weakness and headaches.  Hematological: Negative for adenopathy. Does not bruise/bleed easily.  Psychiatric/Behavioral: Negative for confusion and sleep disturbance. The patient is not nervous/anxious.     PE; Blood pressure 147/76, pulse 55, temperature 98.1 F (36.7 C), temperature source Oral, resp. rate 16, height 5' 7.5" (1.715 m), weight 173 lb (78.472 kg), SpO2 97 %. BP recheck 122/76 Gen: Alert, well appearing.  Patient is oriented to person, place, time, and situation. AFFECT: pleasant, lucid thought and speech. ENT: Ears: EACs clear, normal epithelium.  TMs with good light reflex and landmarks bilaterally.  Eyes: no injection, icteris, swelling, or exudate.  EOMI, PERRLA. Nose: no drainage or turbinate edema/swelling.  No injection or focal lesion.  Mouth: lips without lesion/swelling.  Oral mucosa pink and moist.  Dentition intact and without obvious caries or gingival swelling.  Oropharynx without erythema, exudate, or swelling.  Neck: supple/nontender.  No LAD, mass, or TM.  Carotid pulses 2+ bilaterally, without bruits. CV: Irreg irreg rhythm, rate about 60, no m/r/g.   LUNGS: CTA bilat, nonlabored resps, good aeration in all lung fields. ABD: soft, NT, ND, BS normal.  No hepatospenomegaly or mass.  No bruits. EXT: no clubbing or cyanosis.  He has 1+ bilat LE pitting edema and some scattered non-erythematous varicosities in LL's. Musculoskeletal: no joint swelling, erythema,  warmth, or tenderness.  ROM of all joints intact. Skin - no sores or suspicious lesions or rashes or color changes Rectal exam: negative without mass, lesions or tenderness, PROSTATE EXAM: smooth and symmetric without nodules or tenderness.   Pertinent labs:  Lab Results  Component Value Date   TSH 1.99 05/26/2015   Lab Results  Component Value Date   WBC 7.0 05/26/2015   HGB 16.1 05/26/2015   HCT 48.7 05/26/2015   MCV 93.1 05/26/2015   PLT 134.0* 05/26/2015   Lab Results  Component Value Date   CREATININE 0.87 05/26/2015   BUN 12 05/26/2015   NA 142 05/26/2015   K 4.6 05/26/2015   CL 105 05/26/2015   CO2 30 05/26/2015   Lab Results  Component Value Date   ALT 24  05/26/2015   AST 20 05/26/2015   ALKPHOS 49 05/26/2015   BILITOT 1.3* 05/26/2015   Lab Results  Component Value Date   CHOL 133 05/26/2015   Lab Results  Component Value Date   HDL 43.30 05/26/2015   Lab Results  Component Value Date   LDLCALC 70 05/26/2015   Lab Results  Component Value Date   TRIG 100.0 05/26/2015   Lab Results  Component Value Date   CHOLHDL 3 05/26/2015   Lab Results  Component Value Date   PSA 1.43 05/26/2015   PSA 1.01 03/20/2013   Lab Results  Component Value Date   HGBA1C 5.8 05/27/2015    ASSESSMENT AND PLAN:   1) IFG: discussed low carb diet, increase exercise. Will recheck HbA1c at next f/u in 6 mo.  2) Erectile dysfunction and mild BPH sx's: will do trial of cialis 5mg  qd x 2 mo  and if helpful will send rx for this to mail order pharmacy.  If not helpful then will refer pt to urology.  3) Health maintenance exam:  Reviewed age and gender appropriate health maintenance issues (prudent diet, regular exercise, health risks of tobacco and excessive alcohol, use of seatbelts, fire alarms in home, use of sunscreen).  Also reviewed age and gender appropriate health screening as well as vaccine recommendations. Colon polyps: refer to local GI for next  colonoscopy. HP labs all good except mild IFG--discussed. Prostate ca screening: normal DRE and PSA.  We discussed ongoing screening for prostate cancer, risks/benefits, and decided that this would be his last screening.  An After Visit Summary was printed and given to the patient.  FOLLOW UP:  No Follow-up on file.

## 2015-06-05 NOTE — Telephone Encounter (Signed)
Pt LMOM on 06/05/15 stating that Rx was sent to [facility] but he wanted it sent to [facility] order. Rx cancelled at [facility] and new order sent to [facility] order. Pt advised and voiced understanding.

## 2015-06-05 NOTE — Progress Notes (Signed)
Pre visit review using our clinic review tool, if applicable. No additional management support is needed unless otherwise documented below in the visit note. 

## 2015-06-10 ENCOUNTER — Encounter: Payer: Self-pay | Admitting: *Deleted

## 2015-06-11 ENCOUNTER — Encounter: Payer: Self-pay | Admitting: Family Medicine

## 2015-06-12 ENCOUNTER — Telehealth: Payer: Self-pay | Admitting: Internal Medicine

## 2015-06-12 NOTE — Telephone Encounter (Signed)
Received GI records from Dr Nickie Retort in [place], [place]. Most of the records are ineligible but will put records on Dr. Celesta Aver desk for review. Patient is requesting to see Dr. Carlean Purl.

## 2015-06-23 NOTE — Telephone Encounter (Signed)
Dr. Gessner reviewed records and has accepted patient. Ok to schedule Direct Colonoscopy. Left message for patient to return my call °

## 2015-06-30 ENCOUNTER — Encounter: Payer: Self-pay | Admitting: Internal Medicine

## 2015-06-30 NOTE — Telephone Encounter (Signed)
Patient states that he is on warfarin. OV scheduled with Dr. Carlean Purl

## 2015-07-01 ENCOUNTER — Other Ambulatory Visit (INDEPENDENT_AMBULATORY_CARE_PROVIDER_SITE_OTHER): Payer: Commercial Managed Care - HMO

## 2015-07-01 DIAGNOSIS — I4891 Unspecified atrial fibrillation: Secondary | ICD-10-CM

## 2015-07-01 LAB — PROTIME-INR
INR: 2.1 ratio — ABNORMAL HIGH (ref 0.8–1.0)
Prothrombin Time: 22.8 s — ABNORMAL HIGH (ref 9.6–13.1)

## 2015-07-02 ENCOUNTER — Other Ambulatory Visit: Payer: Self-pay | Admitting: *Deleted

## 2015-07-02 DIAGNOSIS — I4891 Unspecified atrial fibrillation: Secondary | ICD-10-CM

## 2015-07-14 ENCOUNTER — Encounter: Payer: Self-pay | Admitting: Family Medicine

## 2015-07-14 ENCOUNTER — Ambulatory Visit (INDEPENDENT_AMBULATORY_CARE_PROVIDER_SITE_OTHER): Payer: Commercial Managed Care - HMO | Admitting: Family Medicine

## 2015-07-14 VITALS — BP 144/75 | Temp 98.2°F | Ht 70.0 in | Wt 173.0 lb

## 2015-07-14 DIAGNOSIS — J069 Acute upper respiratory infection, unspecified: Secondary | ICD-10-CM

## 2015-07-14 NOTE — Progress Notes (Signed)
OFFICE NOTE  07/14/2015  CC: No chief complaint on file.    HPI: Patient is a 81 y.o. Caucasian male who is here for respiratory complaints. Onset about 10d/a, nasal congestion/sinus pressure, PND, sneezing, some coughing.   No fever.  Some ST last week, gone now.   Sinus pressure/mucous production/PND has not improved any, +HA.  No face pain or upper teeth pain.  Pertinent PMH:  Past medical, surgical, social, and family history reviewed and no changes are noted since last office visit.  MEDS:  Outpatient Prescriptions Prior to Visit  Medication Sig Dispense Refill  . atorvastatin (LIPITOR) 40 MG tablet TAKE 1 TABLET EVERY DAY 90 tablet 1  . cetirizine (ZYRTEC) 10 MG tablet Take 10 mg by mouth as needed.     . fluticasone (FLONASE) 50 MCG/ACT nasal spray Place 2 sprays into both nostrils daily. 48 g 3  . ipratropium (ATROVENT) 0.06 % nasal spray Place 2 sprays into both nostrils 3 (three) times daily. 15 mL 1  . metoprolol tartrate (LOPRESSOR) 25 MG tablet TAKE 1 TABLET TWICE DAILY 180 tablet 1  . Omega-3 Fatty Acids (FISH OIL PO) Take by mouth.      . triamcinolone ointment (TRIDERM) 0.1 % Apply 1 application topically 2 (two) times daily as needed.    . warfarin (COUMADIN) 5 MG tablet TAKE AS DIRECTED BY MD 118 tablet 4  . tadalafil (CIALIS) 5 MG tablet Take 1 tablet (5 mg total) by mouth daily as needed for erectile dysfunction. (Patient not taking: Reported on 07/14/2015) 90 tablet 1   No facility-administered medications prior to visit.    PE: Blood pressure 144/75, temperature 98.2 F (36.8 C), temperature source Oral, height 5\' 10"  (1.778 m), weight 173 lb (78.472 kg). VS: noted--normal. Gen: alert, NAD, NONTOXIC APPEARING. HEENT: eyes without injection, drainage, or swelling.  Ears: EACs clear, TMs with normal light reflex and landmarks.  Nose: Clear rhinorrhea, with some dried, crusty exudate adherent to mildly injected mucosa.  No purulent d/c.  No paranasal sinus  TTP.  No facial swelling.  Throat and mouth without focal lesion.  No pharyngial swelling, erythema, or exudate.   Neck: supple, no LAD.   LUNGS: CTA bilat, nonlabored resps.   CV: RRR, no m/r/g. EXT: no c/c/e SKIN: no rash   IMPRESSION AND PLAN:  Viral URI, no sign of bacterial sinusitis or chest infection/RAD at this time. Reassured pt, encouraged him to restart his zyrtec, continue saline nasal spray, continue flonase. Call if not changed any in 1 week or if worse before then. An After Visit Summary was printed and given to the patient.   FOLLOW UP: prn

## 2015-07-31 ENCOUNTER — Other Ambulatory Visit (INDEPENDENT_AMBULATORY_CARE_PROVIDER_SITE_OTHER): Payer: Commercial Managed Care - HMO

## 2015-07-31 DIAGNOSIS — I4891 Unspecified atrial fibrillation: Secondary | ICD-10-CM

## 2015-07-31 LAB — PROTIME-INR
INR: 2.1 ratio — AB (ref 0.8–1.0)
Prothrombin Time: 22.8 s — ABNORMAL HIGH (ref 9.6–13.1)

## 2015-08-01 ENCOUNTER — Other Ambulatory Visit: Payer: Self-pay | Admitting: *Deleted

## 2015-08-01 DIAGNOSIS — I4891 Unspecified atrial fibrillation: Secondary | ICD-10-CM

## 2015-08-29 ENCOUNTER — Ambulatory Visit: Payer: Commercial Managed Care - HMO | Admitting: Internal Medicine

## 2015-09-02 ENCOUNTER — Other Ambulatory Visit (INDEPENDENT_AMBULATORY_CARE_PROVIDER_SITE_OTHER): Payer: Commercial Managed Care - HMO

## 2015-09-02 DIAGNOSIS — I4891 Unspecified atrial fibrillation: Secondary | ICD-10-CM

## 2015-09-02 LAB — PROTIME-INR
INR: 2.7 ratio — ABNORMAL HIGH (ref 0.8–1.0)
Prothrombin Time: 28.8 s — ABNORMAL HIGH (ref 9.6–13.1)

## 2015-09-25 ENCOUNTER — Encounter: Payer: Self-pay | Admitting: Family Medicine

## 2015-09-25 ENCOUNTER — Ambulatory Visit (INDEPENDENT_AMBULATORY_CARE_PROVIDER_SITE_OTHER): Payer: Commercial Managed Care - HMO | Admitting: Family Medicine

## 2015-09-25 VITALS — BP 112/70 | HR 63 | Temp 98.1°F | Resp 16 | Ht 67.5 in | Wt 176.2 lb

## 2015-09-25 DIAGNOSIS — I482 Chronic atrial fibrillation, unspecified: Secondary | ICD-10-CM

## 2015-09-25 DIAGNOSIS — R293 Abnormal posture: Secondary | ICD-10-CM | POA: Diagnosis not present

## 2015-09-25 DIAGNOSIS — Z0000 Encounter for general adult medical examination without abnormal findings: Secondary | ICD-10-CM | POA: Diagnosis not present

## 2015-09-25 LAB — PROTIME-INR
INR: 2.3 ratio — ABNORMAL HIGH (ref 0.8–1.0)
PROTHROMBIN TIME: 24.6 s — AB (ref 9.6–13.1)

## 2015-09-25 NOTE — Progress Notes (Signed)
Pre visit review using our clinic review tool, if applicable. No additional management support is needed unless otherwise documented below in the visit note. 

## 2015-09-25 NOTE — Patient Instructions (Signed)
We will check your glucose and monitor for diabetes periodically. Keep plans to get your next colonoscopy this year. Keep plans to get set up with a new audiologist to help with your hearing aids.

## 2015-09-25 NOTE — Progress Notes (Signed)
The patient is here for annual Medicare wellness examination and management of other chronic and acute problems. Other problems discussed today: 1) Due for his routine PT/INR for chronic a-fib on coumadin therapy.  2) postural abnormality noted by his family members for about the last 1 yr.  Right hip rides high, right shoulder slopes lower than L, has an indention in R side of abdomen.  No pain.  Has remote history of low back strain and has undergone PT for LB pain in the past but is currently not suffering from any.  No known hx of compression fx or scoliosis.  No hx of back surgery.  EXAM: Gen: Alert, well appearing.  Patient is oriented to person, place, time, and situation. Posture: R hip rides up higher than L a little bit, R shoulder sits lower than L.  R side of mid abdomen has a bit of an indention.  Back is nontender.  Leg lengths equal in sitting and supine position.  Hips: ROM intact and without pain.  No hip tenderness.  BACK: no curvature or rotation detected.  No areas of tenderness.  A/P: pelvic tilt, causing postural deformity.  Will check bilat hip/pelvis plain films.  If these are normal, then since pt is asymptomatic I think nothing further needs to be done except reassurance at this time.   AWV DATA The risk factors are reflected in the social history.  The roster of all physicians providing medical care to patient is listed in the Snapshot section of the chart.  Activities of daily living:  The patient is 100% independent in all ADLs: dressing, toileting, feeding as well as independent mobility.  Home safety : The patient has smoke detectors in the home. They wear seatbelts. No firearms at home ( firearms are present in the home, kept in a safe fashion). There is no violence in the home.   There is no risks for hepatitis, STDs or HIV. There is no history of blood transfusion. They have no travel history to infectious disease endemic areas of the world.  The patient has not  seen their dentist in the last six months but they plan to soon. They have seen their eye doctor in the last year. He has hearing difficulty and has had audiologic testing in the last year.  He plans on getting another audiologist soon so they can adjust the current hearing aids he has now.  They do not have excessive sun exposure. Discussed the need for sun protection: hats, long sleeves and use of sunscreen if there is significant sun exposure.   Diet: the importance of a healthy diet is discussed. They do have a healthy diet.  The patient does not have a regular exercise program but has the equipment needed and wants to slowly get back into this: a hurt knee in the last couple years caused a pause in his exercise habits.  Depression screen: there are no signs or vegative symptoms of depression- irritability, change in appetite, anhedonia, sadness/tearfullness.  Cognitive assessment: the patient manages all their financial and personal affairs and is actively engaged. They could relate day,date,year and events; recalled 3/3 objects at 3 minutes; performed clock-face test normally.  The following portions of the patient's history were reviewed and updated as appropriate: allergies, current medications, past family history, past medical history,  past surgical history, past social history  and problem list.  Vision, hearing, body mass index were assessed and reviewed.   During the course of the visit the patient was  educated and counseled about appropriate screening and preventive services including :  Annual wellness visit --today diabetes screening--will be done at next f/u visit in 6 mo. colorectal cancer screening--plans in place to get GI consult locally and next colonoscopy this year for hx of adenomatous colon polyps. recommended immunizations (influenza, pneumococcal, Hep B)-He has had influenza and both pneumococcal vaccines.  Not a candidate for Hep B. Bone mass  measurement--NA Counseling to prevent tobacco use-NA Depression screening-will do annually. Glaucoma screening--annually with eye MD. Hepatitis C virus screening--NA HIV virus screening-NA Lung cancer screening-NA Medical nutrition therapy-NA Prostate cancer screening-Pt declines any further prostate cancer screening. Screening mammography-NA Screening pap tests, pelvic exam, and clinical breast exam-NA Ultrasound screening for AAA--NA  A written plan of action regarding the above screening and preventative services was given to the patient today.  Instructions: We will check your glucose and monitor for diabetes periodically. Keep plans to get your next colonoscopy this year. Keep plans to get set up with a new audiologist to help with your hearing aids.

## 2015-10-06 ENCOUNTER — Other Ambulatory Visit: Payer: Self-pay | Admitting: Family Medicine

## 2015-10-13 ENCOUNTER — Telehealth: Payer: Self-pay | Admitting: Family Medicine

## 2015-10-13 DIAGNOSIS — H9190 Unspecified hearing loss, unspecified ear: Secondary | ICD-10-CM

## 2015-10-13 MED ORDER — FLUTICASONE PROPIONATE 50 MCG/ACT NA SUSP: 2.0000 | Freq: Every day | NASAL | Status: DC

## 2015-10-13 NOTE — Telephone Encounter (Signed)
Diane advised that order has been put in EMR and Pt advised and voiced understanding.

## 2015-10-13 NOTE — Telephone Encounter (Signed)
RF request for flonase LOV: 09/25/15 Next ov: None Last written: 11/11/14 #48g w/ 3RF  Rx sent.   Please advise about referral. Thanks.

## 2015-10-13 NOTE — Telephone Encounter (Signed)
Audiology referral ordered as per pt request. 

## 2015-10-13 NOTE — Telephone Encounter (Signed)
Pt aking for a refill on flonase and a referral to [facility]. Pt states that he has an appt there tomorrow.

## 2015-10-14 ENCOUNTER — Ambulatory Visit (HOSPITAL_BASED_OUTPATIENT_CLINIC_OR_DEPARTMENT_OTHER)
Admission: RE | Admit: 2015-10-14 | Discharge: 2015-10-14 | Disposition: A | Discharge status: Home / Self Care | Payer: Commercial Managed Care - HMO | Source: Ambulatory Visit | Attending: Family Medicine | Admitting: Family Medicine

## 2015-10-14 DIAGNOSIS — M160 Bilateral primary osteoarthritis of hip: Secondary | ICD-10-CM | POA: Diagnosis not present

## 2015-10-14 DIAGNOSIS — M769 Unspecified enthesopathy, lower limb, excluding foot: Secondary | ICD-10-CM | POA: Insufficient documentation

## 2015-10-14 DIAGNOSIS — R293 Abnormal posture: Secondary | ICD-10-CM | POA: Diagnosis not present

## 2015-10-14 DIAGNOSIS — H903 Sensorineural hearing loss, bilateral: Secondary | ICD-10-CM | POA: Diagnosis not present

## 2015-10-14 DIAGNOSIS — M1288 Other specific arthropathies, not elsewhere classified, other specified site: Secondary | ICD-10-CM | POA: Diagnosis not present

## 2015-10-15 ENCOUNTER — Other Ambulatory Visit: Payer: Self-pay | Admitting: *Deleted

## 2015-10-15 ENCOUNTER — Telehealth: Payer: Self-pay

## 2015-10-15 DIAGNOSIS — I482 Chronic atrial fibrillation, unspecified: Secondary | ICD-10-CM

## 2015-10-15 NOTE — Telephone Encounter (Signed)
Called patient. Gave lab results. Patient verbalized understanding.  

## 2015-10-15 NOTE — Telephone Encounter (Signed)
-----   Message from Tammi Sou, MD sent at 10/14/2015  9:31 PM EDT ----- Pls reassure pt that his hips and pelvis xrays showed only a minimal amount of "wear and tear" arthritis changes.  No abnormality found that could explain his postural abnormality.  No further testing recommended at this time.-thx

## 2015-10-27 ENCOUNTER — Other Ambulatory Visit: Payer: Self-pay | Admitting: Family Medicine

## 2015-10-27 NOTE — Telephone Encounter (Signed)
RF request for metoprolol LOV: 09/25/15 Next ov: None Last written: 05/12/15 #180 w/ 1RF

## 2015-10-30 ENCOUNTER — Other Ambulatory Visit (INDEPENDENT_AMBULATORY_CARE_PROVIDER_SITE_OTHER): Payer: Commercial Managed Care - HMO

## 2015-10-30 DIAGNOSIS — I482 Chronic atrial fibrillation, unspecified: Secondary | ICD-10-CM

## 2015-10-30 LAB — PROTIME-INR
INR: 2.7 ratio — ABNORMAL HIGH (ref 0.8–1.0)
PROTHROMBIN TIME: 28.9 s — AB (ref 9.6–13.1)

## 2015-11-28 ENCOUNTER — Other Ambulatory Visit (INDEPENDENT_AMBULATORY_CARE_PROVIDER_SITE_OTHER): Payer: Commercial Managed Care - HMO

## 2015-11-28 DIAGNOSIS — I4891 Unspecified atrial fibrillation: Secondary | ICD-10-CM

## 2015-11-28 LAB — PROTIME-INR
INR: 2.4 ratio — ABNORMAL HIGH (ref 0.8–1.0)
PROTHROMBIN TIME: 25.9 s — AB (ref 9.6–13.1)

## 2015-12-01 ENCOUNTER — Other Ambulatory Visit: Payer: Self-pay | Admitting: *Deleted

## 2015-12-01 DIAGNOSIS — I482 Chronic atrial fibrillation, unspecified: Secondary | ICD-10-CM

## 2015-12-04 ENCOUNTER — Telehealth: Payer: Self-pay

## 2015-12-04 ENCOUNTER — Ambulatory Visit (INDEPENDENT_AMBULATORY_CARE_PROVIDER_SITE_OTHER): Payer: Commercial Managed Care - HMO | Admitting: Internal Medicine

## 2015-12-04 ENCOUNTER — Encounter: Payer: Self-pay | Admitting: Internal Medicine

## 2015-12-04 VITALS — BP 110/72 | HR 80 | Ht 67.5 in | Wt 174.0 lb

## 2015-12-04 DIAGNOSIS — I4891 Unspecified atrial fibrillation: Secondary | ICD-10-CM | POA: Diagnosis not present

## 2015-12-04 DIAGNOSIS — Z86010 Personal history of colonic polyps: Secondary | ICD-10-CM | POA: Diagnosis not present

## 2015-12-04 DIAGNOSIS — K648 Other hemorrhoids: Secondary | ICD-10-CM

## 2015-12-04 DIAGNOSIS — Z7901 Long term (current) use of anticoagulants: Secondary | ICD-10-CM

## 2015-12-04 DIAGNOSIS — K642 Third degree hemorrhoids: Secondary | ICD-10-CM | POA: Insufficient documentation

## 2015-12-04 NOTE — Patient Instructions (Addendum)
   It has been recommended to you by your physician that you have a(n) colonscopy completed. Per your request, we did not schedule the procedure(s) today. Please contact our office at [phone] should you decide to have the procedure completed.    You will be contaced by our office prior to your procedure for directions on holding your Coumadin/Warfarin.  If you do not hear from our office 1 week prior to your scheduled procedure, please call [phone] to discuss.  We are going to go ahead and get clearance even thou we don't have a date set for the colonoscopy.    I appreciate the opportunity to care for you.

## 2015-12-04 NOTE — Progress Notes (Signed)
Subjective:    Patient ID: Angel Exon Sr., male    DOB: 1933/08/08, 82 y.o.   MRN: [id number] Chief complaint: History of colon polyps, symptomatic Hemorrhoids HPI Patient is a very nice married 82 year old white man who had previous colonoscopy and polypectomy in [place], last was in 2012, he had an 11 mm adenomatous polyp piecemeal removed from the ascending colon and a 4 mm cecal polyp removed. Both were adenomas. Records of that reviewed EGD was done which showed some esophagitis and gastritis. Takes warfarin for atrial fibrillation he's never had a stroke. He does have hemorrhoids, he complains of prolapsing, grade 3 by history he has to push the hemorrhoid back up at times. He's never had particular treatment for that. Does not describe significant bleeding. Allergies  Allergen Reactions  . Sulfa Antibiotics    Outpatient Prescriptions Prior to Visit  Medication Sig Dispense Refill  . atorvastatin (LIPITOR) 40 MG tablet TAKE 1 TABLET EVERY DAY 90 tablet 1  . cetirizine (ZYRTEC) 10 MG tablet Take 10 mg by mouth as needed.     . fluticasone (FLONASE) 50 MCG/ACT nasal spray Place 2 sprays into both nostrils daily. 48 g 3  . ipratropium (ATROVENT) 0.06 % nasal spray Place 2 sprays into both nostrils 3 (three) times daily. 15 mL 1  . metoprolol tartrate (LOPRESSOR) 25 MG tablet TAKE 1 TABLET TWICE DAILY 180 tablet 3  . Omega-3 Fatty Acids (FISH OIL PO) Take by mouth.      . triamcinolone ointment (TRIDERM) 0.1 % Apply 1 application topically 2 (two) times daily as needed.    . warfarin (COUMADIN) 5 MG tablet TAKE AS DIRECTED BY MD 118 tablet 4   No facility-administered medications prior to visit.   Past Medical History  Diagnosis Date  . Hypercholesteremia   . Hypertension   . CAD (coronary artery disease)   . Atrial fibrillation ([place])     peri op afib initially, then dx'd with permanent a-fib 2014 (xarelto started, switched to coumadin for cost reasons)  . Adenomatous  colon polyp   . Chronic idiopathic thrombocytopenia ([facility])     very mild  . Allergic rhinitis     primarily seasonal  . Hearing impairment     AU; has hearing aids  . IFG (impaired fasting glucose) 05/2015    A1c 5.8%  . Diverticulosis 2012 scope    pancolonic  . Ulcerative esophagitis   . GERD (gastroesophageal reflux disease)   . Pneumonia    Past Surgical History  Procedure Laterality Date  . Coronary artery bypass graft  2011    L - LAD, S - diag, S - PDA/LV branch 2011.  Nl EF  . Tonsillectomy and adenoidectomy  as a child  . Cataract extraction    . Inguinal hernia repair  1970s    right groin  . Colonoscopy w/ polypectomy  X 3, most recent 09/03/2010    2012: 14 mm adenomatous polyp with mild to moderate dysplasia was removed from ascending colon  . Cardiovascular stress test  12/2012    Nuclear stress (exercise) testing: low risk scan  . Upper gi endoscopy  09/03/2010    hiatus hernia, no Barrett's esoph,esoph stricture dilated, gastric ulcers (h pylori neg)   Social History   Social History  . Marital Status: Married    Spouse Name: N/A  . Number of Children: 5  . Years of Education: N/A   Occupational History  . Retired    Social History Main Topics  .  Smoking status: Never Smoker   . Smokeless tobacco: Never Used  . Alcohol Use: No  . Drug Use: No  . Sexual Activity: Not Asked   Other Topics Concern  . None   Social History Narrative   Married, 5 children, 10 grandchildren.   Relocated to [place] from [place] in 2012 for warmer climate.  Orig from [place], [place].   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   Hobby: woodworking.   No T/A/Ds.   Exercise: treadmill and stationary bike--issue with knee temporarily got him out of routine--set to restart 03/2013.   Diet: prudent diet but nothing drastic.            Family History  Problem Relation Age of Onset  . Coronary artery disease Brother 50    stent  . Stroke Mother   .  Pneumonia Father   . Diabetes Brother   . Diabetes Father     Review of Systems As per history of present illness. Has hearing aid. Otherwise review of systems is negative.    Objective:   Physical Exam @BP  110/72 mmHg  Pulse 80  Ht 5' 7.5" (1.715 m)  Wt 174 lb (78.926 kg)  BMI 26.83 kg/m2@  General:  Well-developed, well-nourished and in no acute distress Eyes:  anicteric. ENT:   Mouth and posterior pharynx free of lesions.  Neck:   supple w/o thyromegaly or mass.  Lungs: Clear to auscultation bilaterally. Heart:  S1S2, no rubs, murmurs, gallops. Abdomen:  soft, non-tender, no hepatosplenomegaly, hernia, or mass and BS+.  Rectal: Deferred until colonoscopy  Lymph:  no cervical or supraclavicular adenopathy. Extremities:   no edema, cyanosis or clubbing Skin   no rash. Neuro:  A&O x 3.  Psych:  appropriate mood and  Affect.   Data Reviewed: As per history of present illness CBC in October 2016 platelets 134 otherwise normal. INR has been therapeutic.      Assessment & Plan:   Encounter Diagnoses  Name Primary?  [name] Hx of adenomatous colonic polyps Yes  . Atrial fibrillation, unspecified type ([place])   . Chronic anticoagulation - warfarin Afib   . Prolapsed internal hemorrhoids, grade 3     I think even though a 81 has some comorbidities given the fact that he had an advanced adenoma removed in 2012 1 more colonoscopy make sense. He is except and this idea. There is a small increased risk of stroke by holding warfarin but we should hold his warfarin for 3 days before his procedure. We'll double check with cardiology electronically to see if they are okay with that, but I recommended he hold it for 3 days now restarted this and as I can afterward the procedure.The risks and benefits as well as alternatives of endoscopic procedure(s) have been discussed and reviewed. All questions answered. The patient agrees to proceed.   By history he seems to have symptomatic  hemorrhoids. Will evaluate these and consider him for hemorrhoid banding, he would need to hold warfarin for a longer period of time if these are amenable to banding. He has a literature and rural review it.  [id number] H, MD

## 2015-12-04 NOTE — Telephone Encounter (Signed)
[facility] [address] [place] [address]  12/04/2015   RE: Angel HOPMAN Sr. DOB: Dec 31, 1933 MRN: [id number]   Dear Minus Breeding,    We are going to  schedule the above patient for an endoscopic procedure. Our records show that he is on anticoagulation therapy.   Please advise as to how long the patient may come off his therapy of warfarin prior to the colonoscopy procedure, which is to be scheduled.  He has a visit with you Sir on 01/21/16 and is wanting to set it up after that.  Please fax back/ or route the completed form to Aloysius Heinle [place], [place] at [phone].   Sincerely,

## 2015-12-05 NOTE — Telephone Encounter (Signed)
OK to hold warfarin for five days prior to the procedure.  No bridging is necessary.

## 2015-12-05 NOTE — Telephone Encounter (Signed)
Patient informed regarding holding his warfarin and states he is still checking on transportation before we can pick a date.

## 2016-01-05 ENCOUNTER — Other Ambulatory Visit (INDEPENDENT_AMBULATORY_CARE_PROVIDER_SITE_OTHER): Payer: Commercial Managed Care - HMO

## 2016-01-05 DIAGNOSIS — I482 Chronic atrial fibrillation, unspecified: Secondary | ICD-10-CM

## 2016-01-05 LAB — PROTIME-INR
INR: 1.8 ratio — AB (ref 0.8–1.0)
Prothrombin Time: 19.3 s — ABNORMAL HIGH (ref 9.6–13.1)

## 2016-01-06 ENCOUNTER — Other Ambulatory Visit: Payer: Self-pay | Admitting: *Deleted

## 2016-01-06 DIAGNOSIS — I4891 Unspecified atrial fibrillation: Secondary | ICD-10-CM

## 2016-01-19 ENCOUNTER — Other Ambulatory Visit: Payer: Commercial Managed Care - HMO

## 2016-01-21 ENCOUNTER — Other Ambulatory Visit (INDEPENDENT_AMBULATORY_CARE_PROVIDER_SITE_OTHER): Payer: Commercial Managed Care - HMO

## 2016-01-21 ENCOUNTER — Ambulatory Visit (INDEPENDENT_AMBULATORY_CARE_PROVIDER_SITE_OTHER): Payer: Commercial Managed Care - HMO | Admitting: Cardiology

## 2016-01-21 ENCOUNTER — Other Ambulatory Visit: Payer: Self-pay | Admitting: *Deleted

## 2016-01-21 ENCOUNTER — Encounter: Payer: Self-pay | Admitting: Cardiology

## 2016-01-21 VITALS — BP 128/72 | HR 80 | Ht 69.0 in | Wt 171.0 lb

## 2016-01-21 DIAGNOSIS — I482 Chronic atrial fibrillation, unspecified: Secondary | ICD-10-CM

## 2016-01-21 DIAGNOSIS — I4891 Unspecified atrial fibrillation: Secondary | ICD-10-CM

## 2016-01-21 DIAGNOSIS — I2510 Atherosclerotic heart disease of native coronary artery without angina pectoris: Secondary | ICD-10-CM | POA: Diagnosis not present

## 2016-01-21 LAB — PROTIME-INR
INR: 2.3 ratio — AB (ref 0.8–1.0)
PROTHROMBIN TIME: 24.8 s — AB (ref 9.6–13.1)

## 2016-01-21 NOTE — Progress Notes (Signed)
HPI The patient presents for follow up of CAD.  He has a history of CAD s/p CABG.  He has permanent atrial fib.   Since I last saw him he has done well. The patient denies any new symptoms such as chest discomfort, neck or arm discomfort. There has been no new shortness of breath, PND or orthopnea. There has been no presyncope or syncope.  He does yard work and has had no symptoms.  He remains active working in his yard.  He moved 10 yards of mulch last week.  He had no symptoms.  He is not using the elliptical or treadmill like he was however.    Allergies  Allergen Reactions  . Sulfa Antibiotics Itching    Current Outpatient Prescriptions  Medication Sig Dispense Refill  . atorvastatin (LIPITOR) 40 MG tablet Take 40 mg by mouth daily.    . cetirizine (ZYRTEC) 10 MG tablet Take 10 mg by mouth as needed.     . fluticasone (FLONASE) 50 MCG/ACT nasal spray Place 2 sprays into both nostrils daily. 48 g 3  . ipratropium (ATROVENT) 0.06 % nasal spray Place 2 sprays into both nostrils 3 (three) times daily. 15 mL 1  . metoprolol tartrate (LOPRESSOR) 25 MG tablet Take 25 mg by mouth 2 (two) times daily.    . Omega-3 Fatty Acids (FISH OIL PO) Take by mouth.      . triamcinolone ointment (TRIDERM) 0.1 % Apply 1 application topically 2 (two) times daily as needed.    . warfarin (COUMADIN) 5 MG tablet TAKE AS DIRECTED BY MD 118 tablet 4   No current facility-administered medications for this visit.    Past Medical History  Diagnosis Date  . Hypercholesteremia   . Hypertension   . CAD (coronary artery disease)   . Atrial fibrillation ([place])     peri op afib initially, then dx'd with permanent a-fib 2014 (xarelto started, switched to coumadin for cost reasons)  . Adenomatous colon polyp   . Chronic idiopathic thrombocytopenia ([facility])     very mild  . Allergic rhinitis     primarily seasonal  . Hearing impairment     AU; has hearing aids  . IFG (impaired fasting glucose) 05/2015    A1c 5.8%    . Diverticulosis 2012 scope    pancolonic  . Ulcerative esophagitis   . GERD (gastroesophageal reflux disease)   . Pneumonia     Past Surgical History  Procedure Laterality Date  . Coronary artery bypass graft  2011    L - LAD, S - diag, S - PDA/LV branch 2011.  Nl EF  . Tonsillectomy and adenoidectomy  as a child  . Cataract extraction    . Inguinal hernia repair  1970s    right groin  . Colonoscopy w/ polypectomy  X 3, most recent 09/03/2010    2012: 14 mm adenomatous polyp with mild to moderate dysplasia was removed from ascending colon  . Cardiovascular stress test  12/2012    Nuclear stress (exercise) testing: low risk scan  . Upper gi endoscopy  09/03/2010    hiatus hernia, no Barrett's esoph,esoph stricture dilated, gastric ulcers (h pylori neg)    ROS:  As stated in the HPI and negative for all other systems.  PHYSICAL EXAM BP 128/72 mmHg  Pulse 80  Ht 5\' 9"  (1.753 m)  Wt 171 lb (77.565 kg)  BMI 25.24 kg/m2 GENERAL:  Well appearing NECK:  No jugular venous distention, waveform within normal limits, carotid  upstroke brisk and symmetric, no bruits, no thyromegalythy LUNGS:  Clear to auscultation bilaterally CHEST:  Well healed sternotomy scar. HEART:  PMI not displaced or sustained,S1 and S2 within normal limits, no S3,  no clicks, no rubs, no murmurs, irregular ABD:  Flat, positive bowel sounds normal in frequency in pitch, no bruits, no rebound, no guarding, no midline pulsatile mass, no hepatomegaly, no splenomegaly EXT:  2 plus pulses throughout, no edema, no cyanosis no clubbing   EKG:  Atrial fib, rate 81, poor anterior all her progression, no acute ST-T wave changes.  01/21/2016  Lab Results  Component Value Date   CHOL 133 05/26/2015   TRIG 100.0 05/26/2015   HDL 43.30 05/26/2015   LDLCALC 70 05/26/2015    ASSESSMENT AND PLAN  ATRIAL FIBRILLATION:   The patient  tolerates this rhythm and rate control and anticoagulation. We will continue with the meds as  listed.  [name] has a CHA2DS2 - VASc score of 4 with a risk of stroke of 4%.    CAD:  He has had no symptoms since a stress perfusion study in 2014. No change in therapy is indicated.  HTN:  The blood pressure is at target. No change in medications is indicated. We will continue with therapeutic lifestyle changes (TLC).  HYPERLIPIDEMIA:  This is excellent and being managed by Tammi Sou, MD.  His LDL was 70 in Oct of last year.

## 2016-01-21 NOTE — Patient Instructions (Signed)

## 2016-02-04 ENCOUNTER — Other Ambulatory Visit: Payer: Self-pay | Admitting: *Deleted

## 2016-02-04 ENCOUNTER — Other Ambulatory Visit (INDEPENDENT_AMBULATORY_CARE_PROVIDER_SITE_OTHER): Payer: Commercial Managed Care - HMO

## 2016-02-04 DIAGNOSIS — I482 Chronic atrial fibrillation, unspecified: Secondary | ICD-10-CM

## 2016-02-04 LAB — PROTIME-INR
INR: 2 ratio — AB (ref 0.8–1.0)
Prothrombin Time: 21.9 s — ABNORMAL HIGH (ref 9.6–13.1)

## 2016-03-08 ENCOUNTER — Other Ambulatory Visit (INDEPENDENT_AMBULATORY_CARE_PROVIDER_SITE_OTHER): Payer: Commercial Managed Care - HMO

## 2016-03-08 DIAGNOSIS — I482 Chronic atrial fibrillation, unspecified: Secondary | ICD-10-CM

## 2016-03-08 LAB — PROTIME-INR
INR: 2.1 ratio — ABNORMAL HIGH (ref 0.8–1.0)
PROTHROMBIN TIME: 22.1 s — AB (ref 9.6–13.1)

## 2016-03-10 ENCOUNTER — Other Ambulatory Visit: Payer: Self-pay | Admitting: *Deleted

## 2016-03-10 DIAGNOSIS — I482 Chronic atrial fibrillation, unspecified: Secondary | ICD-10-CM

## 2016-03-16 ENCOUNTER — Other Ambulatory Visit: Payer: Self-pay | Admitting: Family Medicine

## 2016-04-07 ENCOUNTER — Other Ambulatory Visit (INDEPENDENT_AMBULATORY_CARE_PROVIDER_SITE_OTHER): Payer: Commercial Managed Care - HMO

## 2016-04-07 DIAGNOSIS — I482 Chronic atrial fibrillation, unspecified: Secondary | ICD-10-CM

## 2016-04-07 LAB — PROTIME-INR
INR: 2.6 ratio — AB (ref 0.8–1.0)
PROTHROMBIN TIME: 28.1 s — AB (ref 9.6–13.1)

## 2016-04-26 ENCOUNTER — Telehealth: Payer: Self-pay | Admitting: Family Medicine

## 2016-04-26 DIAGNOSIS — K642 Third degree hemorrhoids: Secondary | ICD-10-CM

## 2016-04-26 DIAGNOSIS — K5909 Other constipation: Secondary | ICD-10-CM

## 2016-04-26 DIAGNOSIS — Z86010 Personal history of colonic polyps: Secondary | ICD-10-CM

## 2016-04-26 NOTE — Telephone Encounter (Signed)
Patient having increased constipation, Even after increasing fluids and prune juice.

## 2016-04-26 NOTE — Telephone Encounter (Signed)
OK, referral ordered as per pt request. 

## 2016-04-26 NOTE — Telephone Encounter (Signed)
Left message for patient to return call.

## 2016-04-26 NOTE — Telephone Encounter (Signed)
Patient has tried OTC laxative but was suppose to go to GI for colonoscopy a few months ago but due to distance couldn't schedule the appointment.  He can however go to the GI group requested.

## 2016-04-26 NOTE — Telephone Encounter (Signed)
Has he tried any OTC or prescription laxatives? Also, I could see him for this--doesn't HAVE to see a specialist. However, if he just feels more comfortable being seen by the specialist I'll be glad to order the referral. Let me know-thx

## 2016-04-26 NOTE — Telephone Encounter (Signed)
I'll make the referral but can you ask him to be more specific about "change in stools" so I can put in a diagnosis/reason for referral.-thx

## 2016-04-26 NOTE — Telephone Encounter (Signed)
Patient is experiencing change in stools. Would like referral to [facility] for GI Disease. Patient does not have anyone that can drive him to Dr. Celesta Aver office in [place].

## 2016-05-01 ENCOUNTER — Other Ambulatory Visit: Payer: Self-pay | Admitting: Family Medicine

## 2016-05-01 DIAGNOSIS — I2510 Atherosclerotic heart disease of native coronary artery without angina pectoris: Secondary | ICD-10-CM

## 2016-05-01 DIAGNOSIS — I482 Chronic atrial fibrillation, unspecified: Secondary | ICD-10-CM

## 2016-05-05 ENCOUNTER — Ambulatory Visit (INDEPENDENT_AMBULATORY_CARE_PROVIDER_SITE_OTHER): Payer: Commercial Managed Care - HMO

## 2016-05-05 ENCOUNTER — Other Ambulatory Visit (INDEPENDENT_AMBULATORY_CARE_PROVIDER_SITE_OTHER): Payer: Commercial Managed Care - HMO

## 2016-05-05 DIAGNOSIS — I482 Chronic atrial fibrillation, unspecified: Secondary | ICD-10-CM

## 2016-05-05 DIAGNOSIS — Z23 Encounter for immunization: Secondary | ICD-10-CM | POA: Diagnosis not present

## 2016-05-05 LAB — PROTIME-INR
INR: 2.1 ratio — AB (ref 0.8–1.0)
PROTHROMBIN TIME: 22.8 s — AB (ref 9.6–13.1)

## 2016-05-05 NOTE — Addendum Note (Signed)
Addended by: Ralph Dowdy on: 05/05/2016 09:30 AM   Modules accepted: Orders

## 2016-05-06 ENCOUNTER — Encounter (INDEPENDENT_AMBULATORY_CARE_PROVIDER_SITE_OTHER): Payer: Self-pay | Admitting: Internal Medicine

## 2016-05-17 ENCOUNTER — Other Ambulatory Visit (INDEPENDENT_AMBULATORY_CARE_PROVIDER_SITE_OTHER): Payer: Self-pay | Admitting: Internal Medicine

## 2016-05-17 ENCOUNTER — Encounter (INDEPENDENT_AMBULATORY_CARE_PROVIDER_SITE_OTHER): Payer: Self-pay | Admitting: *Deleted

## 2016-05-17 ENCOUNTER — Telehealth: Payer: Self-pay | Admitting: Cardiology

## 2016-05-17 ENCOUNTER — Ambulatory Visit (INDEPENDENT_AMBULATORY_CARE_PROVIDER_SITE_OTHER): Payer: Commercial Managed Care - HMO | Admitting: Internal Medicine

## 2016-05-17 ENCOUNTER — Encounter (INDEPENDENT_AMBULATORY_CARE_PROVIDER_SITE_OTHER): Payer: Self-pay | Admitting: Internal Medicine

## 2016-05-17 ENCOUNTER — Telehealth (INDEPENDENT_AMBULATORY_CARE_PROVIDER_SITE_OTHER): Payer: Self-pay | Admitting: *Deleted

## 2016-05-17 VITALS — BP 136/70 | HR 60 | Temp 97.8°F | Ht 68.0 in | Wt 167.7 lb

## 2016-05-17 DIAGNOSIS — Z86010 Personal history of colonic polyps: Secondary | ICD-10-CM | POA: Insufficient documentation

## 2016-05-17 NOTE — Patient Instructions (Signed)
Colonoscopy.  The risks and benefits such as perforation, bleeding, and infection were reviewed with the patient and is agreeable. 

## 2016-05-17 NOTE — Telephone Encounter (Signed)
Clearance send to Dr Laural Golden office via [employer]

## 2016-05-17 NOTE — Telephone Encounter (Signed)
Left Anne at Dr. Olevia Perches office a message that this message will be send to MD for clearance . Pt's last office visit at [facility] office was on 01/21/16.

## 2016-05-17 NOTE — Telephone Encounter (Signed)
OK to hold warfarin for five days prior to the colonoscopy.

## 2016-05-17 NOTE — Telephone Encounter (Signed)
New message     Request for surgical clearance:  1. What type of surgery is being performed? colonscopy  2. When is this surgery scheduled? 12.21.2017   3. Are there any medications that need to be held prior to surgery and how long? Warfarin - 5 days prior   4. Name of physician performing surgery? Dr. Laural Golden   5. What is your office phone and fax number? [phone] / fax [phone]-

## 2016-05-17 NOTE — Progress Notes (Signed)
Subjective:    Patient ID: Angel Exon Sr., male    DOB: September 24, 1933, 82 y.o.   MRN: [id number]  HPI Referred by Dr Anitra Lauth for constipation/hemorrhoids. Saw Dr. Carlean Purl in May. His last colonoscopy was in 2012 with tubular adenoma. He tells me he is doing fine. Appetite is good. No weight loss. He has a BM one a day.  Occasionally see blood from his hemorrhoids. States he has a hemorrhoid for at least 10 years.   09/03/2010 Colonoscopy:[facility]. Dr. Paulene Floor. A sessile polyp found in the ascending colon. 71mm in size.  Polyp removed using piecemeal technique using a cold snare.  Biopsy: Tubular adenoma with mild to moderate dysplasia. No carcinoma identified.  Last EGD in 09/03/2010 at [facility]:  Biopsy: GE junction biopsy: Gastric type glandular mucosa with chronic inflammation. Gastric antrum biopsy: Chronic erosive gastritis.   Hx of atrial fib and maintained on Warfarin.  No family hx of colon cancer Review of Systems     Past Medical History:  Diagnosis Date  . Adenomatous colon polyp   . Allergic rhinitis    primarily seasonal  . Atrial fibrillation ([place])    peri op afib initially, then dx'd with permanent a-fib 2014 (xarelto started, switched to coumadin for cost reasons)  . CAD (coronary artery disease)   . Chronic idiopathic thrombocytopenia (HCC)    very mild  . Diverticulosis 2012 scope   pancolonic  . GERD (gastroesophageal reflux disease)   . Hearing impairment    [place]; has hearing aids  . Hypercholesteremia   . Hypertension   . IFG (impaired fasting glucose) 05/2015   A1c 5.8%  . Pneumonia   . Ulcerative esophagitis     Past Surgical History:  Procedure Laterality Date  . CARDIOVASCULAR STRESS TEST  12/2012   Nuclear stress (exercise) testing: low risk scan  . CATARACT EXTRACTION    . COLONOSCOPY W/ POLYPECTOMY  X 3, most recent 09/03/2010   2012: 14 mm adenomatous polyp with mild to moderate dysplasia was removed  from ascending colon  . CORONARY ARTERY BYPASS GRAFT  2011   L - LAD, S - diag, S - PDA/LV branch 2011.  Nl EF  . INGUINAL HERNIA REPAIR  1970s   right groin  . TONSILLECTOMY AND ADENOIDECTOMY  as a child  . UPPER GI ENDOSCOPY  09/03/2010   hiatus hernia, no Barrett's esoph,esoph stricture dilated, gastric ulcers (h pylori neg)    Allergies  Allergen Reactions  . Sulfa Antibiotics Itching    Cannot remember what kind of reaction    Current Outpatient Prescriptions on File Prior to Visit  Medication Sig Dispense Refill  . atorvastatin (LIPITOR) 40 MG tablet TAKE 1 TABLET EVERY DAY 90 tablet 1  . cetirizine (ZYRTEC) 10 MG tablet Take 10 mg by mouth as needed.     . fluticasone (FLONASE) 50 MCG/ACT nasal spray Place 2 sprays into both nostrils daily. 48 g 3  . ipratropium (ATROVENT) 0.06 % nasal spray Place 2 sprays into both nostrils 3 (three) times daily. 15 mL 1  . metoprolol tartrate (LOPRESSOR) 25 MG tablet Take 25 mg by mouth 2 (two) times daily.    . Omega-3 Fatty Acids (FISH OIL PO) Take by mouth.      . triamcinolone ointment (TRIDERM) 0.1 % Apply 1 application topically 2 (two) times daily as needed.    . warfarin (COUMADIN) 5 MG tablet TAKE AS DIRECTED BY MD 118 tablet 4   No current  facility-administered medications on file prior to visit.           Objective:   Physical Exam Blood pressure 136/70, pulse 60, temperature 97.8 F (36.6 C), height 5\' 8"  (1.727 m), weight 167 lb 11.2 oz (76.1 kg).  Alert and oriented. Skin warm and dry. Oral mucosa is moist.   . Sclera anicteric, conjunctivae is pink. Thyroid not enlarged. No cervical lymphadenopathy. Lungs clear. Heart regular rate and rhythm.  Abdomen is soft. Bowel sounds are positive. No hepatomegaly. No abdominal masses felt. No tenderness.  No edema to lower extremities. No hemorrhoids noted externally.      Assessment & Plan:  Colon polyp: The risks and benefits such as perforation, bleeding, and infection were  reviewed with the patient and is agreeable.

## 2016-05-17 NOTE — Telephone Encounter (Signed)
Patient needs trilyte 

## 2016-05-18 NOTE — Telephone Encounter (Signed)
Faxed to [facility] at Dr Olevia Perches office as requested.

## 2016-05-19 MED ORDER — PEG 3350-KCL-NA BICARB-NACL 420 G PO SOLR: 4000.0000 mL | Freq: Once | ORAL | 0 refills | Status: AC

## 2016-06-10 ENCOUNTER — Other Ambulatory Visit (INDEPENDENT_AMBULATORY_CARE_PROVIDER_SITE_OTHER): Payer: Commercial Managed Care - HMO

## 2016-06-10 DIAGNOSIS — I482 Chronic atrial fibrillation, unspecified: Secondary | ICD-10-CM

## 2016-06-10 LAB — PROTIME-INR
INR: 2.2 ratio — ABNORMAL HIGH (ref 0.8–1.0)
PROTHROMBIN TIME: 23.6 s — AB (ref 9.6–13.1)

## 2016-07-09 ENCOUNTER — Other Ambulatory Visit (INDEPENDENT_AMBULATORY_CARE_PROVIDER_SITE_OTHER): Payer: Commercial Managed Care - HMO

## 2016-07-09 DIAGNOSIS — I482 Chronic atrial fibrillation, unspecified: Secondary | ICD-10-CM

## 2016-07-09 LAB — PROTIME-INR
INR: 2.2 ratio — AB (ref 0.8–1.0)
PROTHROMBIN TIME: 23.1 s — AB (ref 9.6–13.1)

## 2016-07-22 ENCOUNTER — Encounter (HOSPITAL_COMMUNITY)
Admission: RE | Disposition: A | Discharge status: Home / Self Care | Payer: Self-pay | Source: Ambulatory Visit | Attending: Internal Medicine

## 2016-07-22 ENCOUNTER — Encounter (HOSPITAL_COMMUNITY): Payer: Self-pay | Admitting: *Deleted

## 2016-07-22 ENCOUNTER — Ambulatory Visit (HOSPITAL_COMMUNITY)
Admission: RE | Admit: 2016-07-22 | Discharge: 2016-07-22 | Disposition: A | Discharge status: Home / Self Care | Payer: Commercial Managed Care - HMO | Source: Ambulatory Visit | Attending: Internal Medicine | Admitting: Internal Medicine

## 2016-07-22 DIAGNOSIS — I10 Essential (primary) hypertension: Secondary | ICD-10-CM | POA: Insufficient documentation

## 2016-07-22 DIAGNOSIS — Z79899 Other long term (current) drug therapy: Secondary | ICD-10-CM | POA: Diagnosis not present

## 2016-07-22 DIAGNOSIS — K648 Other hemorrhoids: Secondary | ICD-10-CM | POA: Insufficient documentation

## 2016-07-22 DIAGNOSIS — Z86010 Personal history of colonic polyps: Secondary | ICD-10-CM | POA: Diagnosis not present

## 2016-07-22 DIAGNOSIS — Z1211 Encounter for screening for malignant neoplasm of colon: Secondary | ICD-10-CM | POA: Insufficient documentation

## 2016-07-22 DIAGNOSIS — K644 Residual hemorrhoidal skin tags: Secondary | ICD-10-CM | POA: Insufficient documentation

## 2016-07-22 DIAGNOSIS — D125 Benign neoplasm of sigmoid colon: Secondary | ICD-10-CM | POA: Diagnosis not present

## 2016-07-22 DIAGNOSIS — D123 Benign neoplasm of transverse colon: Secondary | ICD-10-CM | POA: Insufficient documentation

## 2016-07-22 DIAGNOSIS — Z7901 Long term (current) use of anticoagulants: Secondary | ICD-10-CM | POA: Insufficient documentation

## 2016-07-22 DIAGNOSIS — K5730 Diverticulosis of large intestine without perforation or abscess without bleeding: Secondary | ICD-10-CM | POA: Insufficient documentation

## 2016-07-22 DIAGNOSIS — E7800 Pure hypercholesterolemia, unspecified: Secondary | ICD-10-CM | POA: Insufficient documentation

## 2016-07-22 DIAGNOSIS — K219 Gastro-esophageal reflux disease without esophagitis: Secondary | ICD-10-CM | POA: Insufficient documentation

## 2016-07-22 DIAGNOSIS — Z951 Presence of aortocoronary bypass graft: Secondary | ICD-10-CM | POA: Insufficient documentation

## 2016-07-22 DIAGNOSIS — Z09 Encounter for follow-up examination after completed treatment for conditions other than malignant neoplasm: Secondary | ICD-10-CM | POA: Diagnosis not present

## 2016-07-22 DIAGNOSIS — I2510 Atherosclerotic heart disease of native coronary artery without angina pectoris: Secondary | ICD-10-CM | POA: Diagnosis not present

## 2016-07-22 DIAGNOSIS — I482 Chronic atrial fibrillation: Secondary | ICD-10-CM | POA: Diagnosis not present

## 2016-07-22 HISTORY — PX: POLYPECTOMY: SHX5525

## 2016-07-22 HISTORY — PX: COLONOSCOPY: SHX5424

## 2016-07-22 SURGERY — COLONOSCOPY
Anesthesia: Moderate Sedation

## 2016-07-22 MED ORDER — MEPERIDINE HCL 50 MG/ML IJ SOLN
INTRAMUSCULAR | Status: AC
  Filled 2016-07-22: qty 1

## 2016-07-22 MED ORDER — MIDAZOLAM HCL 5 MG/5ML IJ SOLN
INTRAMUSCULAR | Status: DC | PRN
  Administered 2016-07-22: 2 mg via INTRAVENOUS
  Administered 2016-07-22: 1 mg via INTRAVENOUS

## 2016-07-22 MED ORDER — STERILE WATER FOR IRRIGATION IR SOLN
Status: DC | PRN
  Administered 2016-07-22: 2.5 mL

## 2016-07-22 MED ORDER — SODIUM CHLORIDE 0.9 % IV SOLN
INTRAVENOUS | Status: DC
  Administered 2016-07-22: 13:00:00 via INTRAVENOUS

## 2016-07-22 MED ORDER — MIDAZOLAM HCL 5 MG/5ML IJ SOLN
INTRAMUSCULAR | Status: AC
  Filled 2016-07-22: qty 10

## 2016-07-22 MED ORDER — MEPERIDINE HCL 50 MG/ML IJ SOLN
INTRAMUSCULAR | Status: DC | PRN
  Administered 2016-07-22: 20 mg via INTRAVENOUS

## 2016-07-22 NOTE — H&P (Signed)
Angel W Witkop Sr. is an 82 y.o. male.   Chief Complaint: Patient is here for colonoscopy. HPI: Patient is 82 year old Caucasian male who was history of multiple colonic adenomas and is here for surveillance colonoscopy. He denies abdominal pain change in bowel habits or rectal bleeding. His last exam was in 2012 anemia or 2 mm adenoma removed with mild to moderate dysplasia. He has been off warfarin for 6 days. History is negative for CRC.  Past Medical History:  Diagnosis Date  . Adenomatous colon polyp   . Allergic rhinitis    primarily seasonal  . Atrial fibrillation ([place])    peri op afib initially, then dx'd with permanent a-fib 2014 (xarelto started, switched to coumadin for cost reasons)  . CAD (coronary artery disease)   . Chronic idiopathic thrombocytopenia (HCC)    very mild  . Diverticulosis 2012 scope   pancolonic  . GERD (gastroesophageal reflux disease)   . Hearing impairment    [place]; has hearing aids  . Hypercholesteremia   . Hypertension   . IFG (impaired fasting glucose) 05/2015   A1c 5.8%  . Pneumonia   . Ulcerative esophagitis     Past Surgical History:  Procedure Laterality Date  . CARDIOVASCULAR STRESS TEST  12/2012   Nuclear stress (exercise) testing: low risk scan  . CATARACT EXTRACTION    . COLONOSCOPY W/ POLYPECTOMY  X 3, most recent 09/03/2010   2012: 14 mm adenomatous polyp with mild to moderate dysplasia was removed from ascending colon  . CORONARY ARTERY BYPASS GRAFT  2011   L - LAD, S - diag, S - PDA/LV branch 2011.  Nl EF  . INGUINAL HERNIA REPAIR  1970s   right groin  . TONSILLECTOMY AND ADENOIDECTOMY  as a child  . UPPER GI ENDOSCOPY  09/03/2010   hiatus hernia, no Barrett's esoph,esoph stricture dilated, gastric ulcers (h pylori neg)    Family History  Problem Relation Age of Onset  . Stroke Mother   . Pneumonia Father   . Diabetes Father   . Coronary artery disease Brother 50    stent  . Diabetes Brother    Social History:  reports  that he has never smoked. He has never used smokeless tobacco. He reports that he does not drink alcohol or use drugs.  Allergies:  Allergies  Allergen Reactions  . Sulfa Antibiotics Itching    Cannot remember what kind of reaction    Medications Prior to Admission  Medication Sig Dispense Refill  . acetaminophen (TYLENOL) 500 MG tablet Take 1,000 mg by mouth daily as needed for moderate pain or headache.    [name] atorvastatin (LIPITOR) 40 MG tablet TAKE 1 TABLET EVERY DAY (Patient taking differently: TAKE 1 TABLET EVERY EVENING) 90 tablet 1  . fluticasone (FLONASE) 50 MCG/ACT nasal spray Place 2 sprays into both nostrils daily. (Patient taking differently: Place 2 sprays into both nostrils daily as needed for allergies. ) 48 g 3  . ipratropium (ATROVENT) 0.06 % nasal spray Place 2 sprays into both nostrils 3 (three) times daily. 15 mL 1  . metoprolol tartrate (LOPRESSOR) 25 MG tablet Take 25 mg by mouth 2 (two) times daily.    . Omega-3 Fatty Acids (FISH OIL PO) Take 360 mg by mouth every evening.     . triamcinolone ointment (TRIDERM) 0.1 % Apply 1 application topically 2 (two) times daily as needed (eczema).     . warfarin (COUMADIN) 5 MG tablet TAKE AS DIRECTED BY MD (Patient taking differently: take  7.5mg  daily on Mon and Wed, take 5mg s daily on Tues, Thurs, Fri, Sat, and Sun) 118 tablet 4  . cetirizine (ZYRTEC) 10 MG tablet Take 10 mg by mouth as needed for allergies.       No results found for this or any previous visit (from the past 48 hour(s)). No results found.  ROS  Blood pressure 132/80, pulse 99, temperature 97.5 F (36.4 C), temperature source Oral, resp. rate 12, height 5\' 8"  (1.727 m), weight 167 lb (75.8 kg), SpO2 98 %. Physical Exam  Constitutional: He appears well-developed and well-nourished.  HENT:  Mouth/Throat: Oropharynx is clear and moist.  Eyes: Conjunctivae are normal. No scleral icterus.  Neck: No thyromegaly present.  Cardiovascular:  Irregular rhythm  normal S1 and S2. No murmur or gallop noted.  Respiratory: Effort normal and breath sounds normal.  GI: Soft. He exhibits no distension. There is no tenderness.  Lymphadenopathy:    He has no cervical adenopathy.  Neurological: He is alert.  Skin: Skin is warm and dry.     Assessment/Plan History of colonic adenomas Surveillance colonoscopy.  Hildred Laser, MD 07/22/2016, 1:31 PM

## 2016-07-22 NOTE — Op Note (Signed)
[facility] Patient Name: Angel Ward Procedure Date: 07/22/2016 11:24 AM MRN: [id number] Date of Birth: [birth year] Attending MD: Hildred Laser , MD CSN: [id number] Age: 82 Admit Type: Outpatient Procedure:                Colonoscopy Indications:              High risk colon cancer surveillance: Personal                            history of colonic polyps Providers:                Hildred Laser, MD, Charlyne Petrin RN, RN, Randa Spike, Technician, Aram Candela Referring MD:             Tammi Sou, MD Medicines:                Meperidine 20 mg IV, Midazolam 3 mg IV Complications:            No immediate complications. Estimated Blood Loss:     Estimated blood loss was minimal. Procedure:                Pre-Anesthesia Assessment:                           - Prior to the procedure, a History and Physical                            was performed, and patient medications and                            allergies were reviewed. The patient's tolerance of                            previous anesthesia was also reviewed. The risks                            and benefits of the procedure and the sedation                            options and risks were discussed with the patient.                            All questions were answered, and informed consent                            was obtained. Prior Anticoagulants: The patient                            last took Coumadin (warfarin) 6 days prior to the                            procedure. ASA Grade Assessment: II - A patient  with mild systemic disease. After reviewing the                            risks and benefits, the patient was deemed in                            satisfactory condition to undergo the procedure.                           After obtaining informed consent, the colonoscope                            was passed under direct vision. Throughout the                             procedure, the patient's blood pressure, pulse, and                            oxygen saturations were monitored continuously. The                            [id number]) scope was introduced through                            the anus and advanced to the the cecum, identified                            by appendiceal orifice and ileocecal valve. The                            colonoscopy was performed without difficulty. The                            patient tolerated the procedure well. The quality                            of the bowel preparation was good. The ileocecal                            valve, appendiceal orifice, and rectum were                            photographed. Scope In: 1:40:40 PM Scope Out: 2:13:27 PM Scope Withdrawal Time: 0 hours 25 minutes 54 seconds  Total Procedure Duration: 0 hours 32 minutes 47 seconds  Findings:      The perianal and digital rectal examinations were normal.      A 9 mm polyp was found in the hepatic flexure. The polyp was sessile. To       prevent bleeding post-intervention, one hemostatic clip was successfully       placed (MR conditional). There was no bleeding at the end of the       procedure.      Two sessile polyps were found in the sigmoid colon. The polyps were       small  in size. These were biopsied with a cold forceps for histology.       The pathology specimen was placed into Bottle Number 2.      A 5 mm polyp was found in the sigmoid colon. The polyp was sessile. The       polyp was removed with a cold snare. Resection was complete, but the       polyp tissue was not retrieved.      Multiple medium-mouthed diverticula were found in the sigmoid colon.      External and internal hemorrhoids were found during retroflexion. The       hemorrhoids were large. Impression:               - One 9 mm polyp at the hepatic flexure. Clip (MR                            conditional) was placed.                            - Two small polyps in the sigmoid colon. Biopsied.                           - One 5 mm polyp in the sigmoid colon, removed with                            a cold snare. Complete resection. Polyp tissue not                            retrieved.                           - Diverticulosis in the sigmoid colon.                           - External and internal hemorrhoids. Moderate Sedation:      Moderate (conscious) sedation was administered by the endoscopy nurse       and supervised by the endoscopist. The following parameters were       monitored: oxygen saturation, heart rate, blood pressure, CO2       capnography and response to care. Total physician intraservice time was       38 minutes. Recommendation:           - Patient has a contact number available for                            emergencies. The signs and symptoms of potential                            delayed complications were discussed with the                            patient. Return to normal activities tomorrow.                            Written discharge instructions were provided to the  patient.                           - High fiber diet today.                           - Continue present medications.                           - Resume Coumadin (warfarin) at prior dose today.                            Refer to [facility] for further adjustment of                            therapy.                           - Await pathology results.                           - No recommendation at this time regarding repeat                            colonoscopy. Procedure Code(s):        --- Professional ---                           [phone], Colonoscopy, flexible; with removal of                            tumor(s), polyp(s), or other lesion(s) by snare                            technique                           45380, 59, Colonoscopy, flexible; with biopsy,                             single or multiple                           99152, Moderate sedation services provided by the                            same physician or other qualified health care                            professional performing the diagnostic or                            therapeutic service that the sedation supports,                            requiring the presence of an independent trained  observer to assist in the monitoring of the                            patient's level of consciousness and physiological                            status; initial 15 minutes of intraservice time,                            patient age 67 years or older                           [phone], Moderate sedation services; each additional                            15 minutes intraservice time                           [phone], Moderate sedation services; each additional                            15 minutes intraservice time Diagnosis Code(s):        --- Professional ---                           Z86.010, Personal history of colonic polyps                           D12.3, Benign neoplasm of transverse colon (hepatic                            flexure or splenic flexure)                           D12.5, Benign neoplasm of sigmoid colon                           K64.8, Other hemorrhoids                           K57.30, Diverticulosis of large intestine without                            perforation or abscess without bleeding CPT copyright 2016 [employer]. All rights reserved. The codes documented in this report are preliminary and upon coder review may  be revised to meet current compliance requirements. Hildred Laser, MD Hildred Laser, MD 07/22/2016 2:25:15 PM This report has been signed electronically. Number of Addenda: 0

## 2016-07-22 NOTE — Discharge Instructions (Signed)
Resume usual medications including warfarin at usual dose starting this evening. High fiber diet. No driving for 24 hours. Physician will call with biopsy results. Please get INR checked in 7-10 days.  Colonoscopy, Adult, Care After This sheet gives you information about how to care for yourself after your procedure. Your doctor may also give you more specific instructions. If you have problems or questions, call your doctor. Follow these instructions at home: General instructions  For the first 24 hours after the procedure:  Do not drive or use machinery.  Do not sign important documents.  Do not drink alcohol.  Do your daily activities more slowly than normal.  Eat foods that are soft and easy to digest.  Rest often.  Take over-the-counter or prescription medicines only as told by your doctor.  It is up to you to get the results of your procedure. Ask your doctor, or the department performing the procedure, when your results will be ready. To help cramping and bloating:  Try walking around.  Put heat on your belly (abdomen) as told by your doctor. Use a heat source that your doctor recommends, such as a moist heat pack or a heating pad.  Put a towel between your skin and the heat source.  Leave the heat on for 20-30 minutes.  Remove the heat if your skin turns bright red. This is especially important if you cannot feel pain, heat, or cold. You can get burned. Eating and drinking  Drink enough fluid to keep your pee (urine) clear or pale yellow.  Return to your normal diet as told by your doctor. Avoid heavy or fried foods that are hard to digest.  Avoid drinking alcohol for as long as told by your doctor. Contact a doctor if:  You have blood in your poop (stool) 2-3 days after the procedure. Get help right away if:  You have more than a small amount of blood in your poop.  You see large clumps of tissue (blood clots) in your poop.  Your belly is  swollen.  You feel sick to your stomach (nauseous).  You throw up (vomit).  You have a fever.  You have belly pain that gets worse, and medicine does not help your pain. This information is not intended to replace advice given to you by your health care provider. Make sure you discuss any questions you have with your health care provider. Document Released: 08/21/2010 Document Revised: 04/12/2016 Document Reviewed: 04/12/2016 Elsevier Interactive Patient Education  2017 [employer].  Colon Polyps Introduction Polyps are tissue growths inside the body. Polyps can grow in many places, including the large intestine (colon). A polyp may be a round bump or a mushroom-shaped growth. You could have one polyp or several. Most colon polyps are noncancerous (benign). However, some colon polyps can become cancerous over time. What are the causes? The exact cause of colon polyps is not known. What increases the risk? This condition is more likely to develop in people who:  Have a family history of colon cancer or colon polyps.  Are older than 53 or older than 45 if they are African American.  Have inflammatory bowel disease, such as ulcerative colitis or Crohn disease.  Are overweight.  Smoke cigarettes.  Do not get enough exercise.  Drink too much alcohol.  Eat a diet that is:  High in fat and red meat.  Low in fiber.  Had childhood cancer that was treated with abdominal radiation. What are the signs or symptoms? Most  polyps do not cause symptoms. If you have symptoms, they may include:  Blood coming from your rectum when having a bowel movement.  Blood in your stool.The stool may look dark red or black.  A change in bowel habits, such as constipation or diarrhea. How is this diagnosed? This condition is diagnosed with a colonoscopy. This is a procedure that uses a lighted, flexible scope to look at the inside of your colon. How is this treated? Treatment for this  condition involves removing any polyps that are found. Those polyps will then be tested for cancer. If cancer is found, your health care provider will talk to you about options for colon cancer treatment. Follow these instructions at home: Diet  Eat plenty of fiber, such as fruits, vegetables, and whole grains.  Eat foods that are high in calcium and vitamin D, such as milk, cheese, yogurt, eggs, liver, fish, and broccoli.  Limit foods high in fat, red meats, and processed meats, such as hot dogs, sausage, bacon, and lunch meats.  Maintain a healthy weight, or lose weight if recommended by your health care provider. General instructions  Do not smoke cigarettes.  Do not drink alcohol excessively.  Keep all follow-up visits as told by your health care provider. This is important. This includes keeping regularly scheduled colonoscopies. Talk to your health care provider about when you need a colonoscopy.  Exercise every day or as told by your health care provider. Contact a health care provider if:  You have new or worsening bleeding during a bowel movement.  You have new or increased blood in your stool.  You have a change in bowel habits.  You unexpectedly lose weight. This information is not intended to replace advice given to you by your health care provider. Make sure you discuss any questions you have with your health care provider. Document Released: 04/14/2004 Document Revised: 12/25/2015 Document Reviewed: 06/09/2015  2017 Elsevier    Hemorrhoids Hemorrhoids are swollen veins in and around the rectum or anus. There are two types of hemorrhoids:  Internal hemorrhoids. These occur in the veins that are just inside the rectum. They may poke through to the outside and become irritated and painful.  External hemorrhoids. These occur in the veins that are outside of the anus and can be felt as a painful swelling or hard lump near the anus. Most hemorrhoids do not cause  serious problems, and they can be managed with home treatments such as diet and lifestyle changes. If home treatments do not help your symptoms, procedures can be done to shrink or remove the hemorrhoids. What are the causes? This condition is caused by increased pressure in the anal area. This pressure may result from various things, including:  Constipation.  Straining to have a bowel movement.  Diarrhea.  Pregnancy.  Obesity.  Sitting for long periods of time.  Heavy lifting or other activity that causes you to strain.  Anal sex. What are the signs or symptoms? Symptoms of this condition include:  Pain.  Anal itching or irritation.  Rectal bleeding.  Leakage of stool (feces).  Anal swelling.  One or more lumps around the anus. How is this diagnosed? This condition can often be diagnosed through a visual exam. Other exams or tests may also be done, such as:  Examination of the rectal area with a gloved hand (digital rectal exam).  Examination of the anal canal using a small tube (anoscope).  A blood test, if you have lost a significant amount  of blood.  A test to look inside the colon (sigmoidoscopy or colonoscopy). How is this treated? This condition can usually be treated at home. However, various procedures may be done if dietary changes, lifestyle changes, and other home treatments do not help your symptoms. These procedures can help make the hemorrhoids smaller or remove them completely. Some of these procedures involve surgery, and others do not. Common procedures include:  Rubber band ligation. Rubber bands are placed at the base of the hemorrhoids to cut off the blood supply to them.  Sclerotherapy. Medicine is injected into the hemorrhoids to shrink them.  Infrared coagulation. A type of light energy is used to get rid of the hemorrhoids.  Hemorrhoidectomy surgery. The hemorrhoids are surgically removed, and the veins that supply them are tied  off.  Stapled hemorrhoidopexy surgery. A circular stapling device is used to remove the hemorrhoids and use staples to cut off the blood supply to them. Follow these instructions at home: Eating and drinking  Eat foods that have a lot of fiber in them, such as whole grains, beans, nuts, fruits, and vegetables. Ask your health care provider about taking products that have added fiber (fiber supplements).  Drink enough fluid to keep your urine clear or pale yellow. Managing pain and swelling  Take warm sitz baths for 20 minutes, 3-4 times a day to ease pain and discomfort.  If directed, apply ice to the affected area. Using ice packs between sitz baths may be helpful.  Put ice in a plastic bag.  Place a towel between your skin and the bag.  Leave the ice on for 20 minutes, 2-3 times a day. General instructions  Take over-the-counter and prescription medicines only as told by your health care provider.  Use medicated creams or suppositories as told.  Exercise regularly.  Go to the bathroom when you have the urge to have a bowel movement. Do not wait.  Avoid straining to have bowel movements.  Keep the anal area dry and clean. Use wet toilet paper or moist towelettes after a bowel movement.  Do not sit on the toilet for long periods of time. This increases blood pooling and pain. Contact a health care provider if:  You have increasing pain and swelling that are not controlled by treatment or medicine.  You have uncontrolled bleeding.  You have difficulty having a bowel movement, or you are unable to have a bowel movement.  You have pain or inflammation outside the area of the hemorrhoids. This information is not intended to replace advice given to you by your health care provider. Make sure you discuss any questions you have with your health care provider. Document Released: 07/16/2000 Document Revised: 12/17/2015 Document Reviewed: 04/02/2015 Elsevier Interactive Patient  Education  2017 Else[employer] Inc.  Diverticulosis Diverticulosis is the condition that develops when small pouches (diverticula) form in the wall of your colon. Your colon, or large intestine, is where water is absorbed and stool is formed. The pouches form when the inside layer of your colon pushes through weak spots in the outer layers of your colon. CAUSES  No one knows exactly what causes diverticulosis. RISK FACTORS  Being older than 40. Your risk for this condition increases with age. Diverticulosis is rare in people younger than 40 years. By age 43, almost everyone has it.  Eating a low-fiber diet.  Being frequently constipated.  Being overweight.  Not getting enough exercise.  Smoking.  Taking over-the-counter pain medicines, like aspirin and ibuprofen. SYMPTOMS  Most  people with diverticulosis do not have symptoms. DIAGNOSIS  Because diverticulosis often has no symptoms, health care providers often discover the condition during an exam for other colon problems. In many cases, a health care provider will diagnose diverticulosis while using a flexible scope to examine the colon (colonoscopy). TREATMENT  If you have never developed an infection related to diverticulosis, you may not need treatment. If you have had an infection before, treatment may include:  Eating more fruits, vegetables, and grains.  Taking a fiber supplement.  Taking a live bacteria supplement (probiotic).  Taking medicine to relax your colon. HOME CARE INSTRUCTIONS   Drink at least 6-8 glasses of water each day to prevent constipation.  Try not to strain when you have a bowel movement.  Keep all follow-up appointments. If you have had an infection before:  Increase the fiber in your diet as directed by your health care provider or dietitian.  Take a dietary fiber supplement if your health care provider approves.  Only take medicines as directed by your health care provider. SEEK MEDICAL CARE  IF:   You have abdominal pain.  You have bloating.  You have cramps.  You have not gone to the bathroom in 3 days. SEEK IMMEDIATE MEDICAL CARE IF:   Your pain gets worse.  Yourbloating becomes very bad.  You have a fever or chills, and your symptoms suddenly get worse.  You begin vomiting.  You have bowel movements that are bloody or black. MAKE SURE YOU:  Understand these instructions.  Will watch your condition.  Will get help right away if you are not doing well or get worse. This information is not intended to replace advice given to you by your health care provider. Make sure you discuss any questions you have with your health care provider. Document Released: 04/15/2004 Document Revised: 07/24/2013 Document Reviewed: 06/13/2013 Elsevier Interactive Patient Education  2017 [place].  High-Fiber Diet Fiber, also called dietary fiber, is a type of carbohydrate found in fruits, vegetables, whole grains, and beans. A high-fiber diet can have many health benefits. Your health care provider may recommend a high-fiber diet to help:  Prevent constipation. Fiber can make your bowel movements more regular.  Lower your cholesterol.  Relieve hemorrhoids, uncomplicated diverticulosis, or irritable bowel syndrome.  Prevent overeating as part of a weight-loss plan.  Prevent heart disease, type 2 diabetes, and certain cancers. What is my plan? The recommended daily intake of fiber includes:  38 grams for men under age 67.  13 grams for men over age 41.  5 grams for women under age 71.  17 grams for women over age 48. You can get the recommended daily intake of dietary fiber by eating a variety of fruits, vegetables, grains, and beans. Your health care provider may also recommend a fiber supplement if it is not possible to get enough fiber through your diet. What do I need to know about a high-fiber diet?  Fiber supplements have not been widely studied for their  effectiveness, so it is better to get fiber through food sources.  Always check the fiber content on thenutrition facts label of any prepackaged food. Look for foods that contain at least 5 grams of fiber per serving.  Ask your dietitian if you have questions about specific foods that are related to your condition, especially if those foods are not listed in the following section.  Increase your daily fiber consumption gradually. Increasing your intake of dietary fiber too quickly may cause bloating,  cramping, or gas.  Drink plenty of water. Water helps you to digest fiber. What foods can I eat? Grains  Whole-grain breads. Multigrain cereal. Oats and oatmeal. Brown rice. Barley. Bulgur wheat. [place]. Bran muffins. Popcorn. Rye wafer crackers. Vegetables  Sweet potatoes. Spinach. Kale. Artichokes. Cabbage. Broccoli. Green peas. Carrots. Squash. Fruits  Berries. Pears. Apples. Oranges. Avocados. Prunes and raisins. Dried figs. Meats and Other Protein Sources  [place], kidney, pinto, and soy beans. Split peas. Lentils. Nuts and seeds. Dairy  Fiber-fortified yogurt. Beverages  Fiber-fortified soy milk. Fiber-fortified orange juice. Other  Fiber bars. The items listed above may not be a complete list of recommended foods or beverages. Contact your dietitian for more options.  What foods are not recommended? Grains  White bread. Pasta made with refined flour. White rice. Vegetables  Fried potatoes. Canned vegetables. Well-cooked vegetables. Fruits  Fruit juice. Cooked, strained fruit. Meats and Other Protein Sources  Fatty cuts of meat. Fried Sales executive or fried fish. Dairy  Milk. Yogurt. Cream cheese. Sour cream. Beverages  Soft drinks. Other  Cakes and pastries. Butter and oils. The items listed above may not be a complete list of foods and beverages to avoid. Contact your dietitian for more information.  What are some tips for including high-fiber foods in my diet?  Eat a wide  variety of high-fiber foods.  Make sure that half of all grains consumed each day are whole grains.  Replace breads and cereals made from refined flour or white flour with whole-grain breads and cereals.  Replace white rice with brown rice, bulgur wheat, or millet.  Start the day with a breakfast that is high in fiber, such as a cereal that contains at least 5 grams of fiber per serving.  Use beans in place of meat in soups, salads, or pasta.  Eat high-fiber snacks, such as berries, raw vegetables, nuts, or popcorn. This information is not intended to replace advice given to you by your health care provider. Make sure you discuss any questions you have with your health care provider. Document Released: 07/19/2005 Document Revised: 12/25/2015 Document Reviewed: 01/01/2014 Elsevier Interactive Patient Education  2017 [employer].

## 2016-07-27 ENCOUNTER — Encounter: Payer: Self-pay | Admitting: Family Medicine

## 2016-08-03 ENCOUNTER — Encounter (HOSPITAL_COMMUNITY): Payer: Self-pay | Admitting: Internal Medicine

## 2016-08-09 ENCOUNTER — Other Ambulatory Visit (INDEPENDENT_AMBULATORY_CARE_PROVIDER_SITE_OTHER): Payer: Commercial Managed Care - HMO

## 2016-08-09 DIAGNOSIS — I482 Chronic atrial fibrillation, unspecified: Secondary | ICD-10-CM

## 2016-08-09 LAB — PROTIME-INR
INR: 1.8 ratio — AB (ref 0.8–1.0)
Prothrombin Time: 19.5 s — ABNORMAL HIGH (ref 9.6–13.1)

## 2016-08-17 ENCOUNTER — Other Ambulatory Visit (INDEPENDENT_AMBULATORY_CARE_PROVIDER_SITE_OTHER): Payer: Commercial Managed Care - HMO

## 2016-08-17 DIAGNOSIS — I482 Chronic atrial fibrillation, unspecified: Secondary | ICD-10-CM

## 2016-08-17 LAB — PROTIME-INR
INR: 2.2 ratio — ABNORMAL HIGH (ref 0.8–1.0)
PROTHROMBIN TIME: 23.5 s — AB (ref 9.6–13.1)

## 2016-09-03 ENCOUNTER — Other Ambulatory Visit: Payer: Self-pay | Admitting: Family Medicine

## 2016-09-03 DIAGNOSIS — I2510 Atherosclerotic heart disease of native coronary artery without angina pectoris: Secondary | ICD-10-CM

## 2016-09-03 DIAGNOSIS — I482 Chronic atrial fibrillation, unspecified: Secondary | ICD-10-CM

## 2016-09-14 ENCOUNTER — Ambulatory Visit (INDEPENDENT_AMBULATORY_CARE_PROVIDER_SITE_OTHER): Payer: Medicare HMO | Admitting: Family Medicine

## 2016-09-14 DIAGNOSIS — I482 Chronic atrial fibrillation, unspecified: Secondary | ICD-10-CM

## 2016-09-14 LAB — PROTIME-INR
INR: 2 ratio — ABNORMAL HIGH (ref 0.8–1.0)
PROTHROMBIN TIME: 21.7 s — AB (ref 9.6–13.1)

## 2016-09-14 NOTE — Progress Notes (Signed)
Patient presents today for PT/INR blood draw.  Tolerated well.

## 2016-10-18 ENCOUNTER — Other Ambulatory Visit (INDEPENDENT_AMBULATORY_CARE_PROVIDER_SITE_OTHER): Payer: Medicare HMO

## 2016-10-18 DIAGNOSIS — I482 Chronic atrial fibrillation, unspecified: Secondary | ICD-10-CM

## 2016-10-18 LAB — PROTIME-INR
INR: 1.9 ratio — AB (ref 0.8–1.0)
Prothrombin Time: 20.5 s — ABNORMAL HIGH (ref 9.6–13.1)

## 2016-11-02 ENCOUNTER — Other Ambulatory Visit (INDEPENDENT_AMBULATORY_CARE_PROVIDER_SITE_OTHER): Payer: Medicare HMO

## 2016-11-02 DIAGNOSIS — I482 Chronic atrial fibrillation, unspecified: Secondary | ICD-10-CM

## 2016-11-02 LAB — PROTIME-INR
INR: 2.3 ratio — ABNORMAL HIGH (ref 0.8–1.0)
Prothrombin Time: 24.8 s — ABNORMAL HIGH (ref 9.6–13.1)

## 2016-11-03 ENCOUNTER — Other Ambulatory Visit: Payer: Self-pay | Admitting: *Deleted

## 2016-11-03 DIAGNOSIS — I482 Chronic atrial fibrillation, unspecified: Secondary | ICD-10-CM

## 2016-11-06 ENCOUNTER — Other Ambulatory Visit: Payer: Self-pay | Admitting: Family Medicine

## 2016-11-06 DIAGNOSIS — I2510 Atherosclerotic heart disease of native coronary artery without angina pectoris: Secondary | ICD-10-CM

## 2016-11-06 DIAGNOSIS — I482 Chronic atrial fibrillation, unspecified: Secondary | ICD-10-CM

## 2016-11-12 DIAGNOSIS — H35371 Puckering of macula, right eye: Secondary | ICD-10-CM | POA: Diagnosis not present

## 2016-11-12 DIAGNOSIS — H02403 Unspecified ptosis of bilateral eyelids: Secondary | ICD-10-CM | POA: Diagnosis not present

## 2016-11-12 DIAGNOSIS — Z961 Presence of intraocular lens: Secondary | ICD-10-CM | POA: Diagnosis not present

## 2016-11-12 DIAGNOSIS — H5213 Myopia, bilateral: Secondary | ICD-10-CM | POA: Diagnosis not present

## 2016-11-15 NOTE — Progress Notes (Signed)
Subjective:   Angel W Arenivas Sr. is a 83 y.o. male who presents for Medicare Annual/Subsequent preventive examination.  Review of Systems:  No ROS.  Medicare Wellness Visit.  Cardiac Risk Factors include: advanced age (>51men, >24 women);dyslipidemia;family history of premature cardiovascular disease;hypertension;male gender   Sleep patterns: Sleeps about 7 hours, up to void x 3.  Home Safety/Smoke Alarms:  Smoke detectors and security in place.  Living environment; residence and Firearm Safety: Lives with wife in 1 story room, 3 steps at door with rail. Feels safe in home. Firearms locked away.   Seat Belt Safety/Bike Helmet: Wears seat belt.   Counseling:   Eye Exam-11/12/2016, yearly Dr Ethlyn Daniels exam > 2 years. Dr. Berdine Addison. Will make appointment.    Male:   [facility]-Colonoscopy 07/22/2016, polyps.  PSA-05/26/2015, 1.430.       Objective:    Vitals: BP 112/64 (BP Location: Left Arm, Patient Position: Sitting, Cuff Size: Normal)   Pulse 80   Ht 5\' 8"  (1.727 m)   Wt 174 lb (78.9 kg)   SpO2 95%   BMI 26.46 kg/m   Body mass index is 26.46 kg/m.  Tobacco History  Smoking Status  . Never Smoker  Smokeless Tobacco  . Never Used     Counseling given: Not Answered   Past Medical History:  Diagnosis Date  . Adenomatous colon polyp   . Allergic rhinitis    primarily seasonal  . Atrial fibrillation ([place])    peri op afib initially, then dx'd with permanent a-fib 2014 (xarelto started, switched to coumadin for cost reasons)  . CAD (coronary artery disease)   . Chronic idiopathic thrombocytopenia (HCC)    very mild  . Diverticulosis 2012 scope   pancolonic  . GERD (gastroesophageal reflux disease)   . Hearing impairment    AU; has hearing aids  . Hypercholesteremia   . Hypertension   . IFG (impaired fasting glucose) 05/2015   A1c 5.8%  . Pneumonia   . Ulcerative esophagitis    Past Surgical History:  Procedure Laterality Date  . CARDIOVASCULAR STRESS TEST   12/2012   Nuclear stress (exercise) testing: low risk scan  . CATARACT EXTRACTION    . COLONOSCOPY N/A 07/22/2016   Procedure: COLONOSCOPY;  Surgeon: Rogene Houston, MD;  Location: [facility];  Service: Endoscopy;  Laterality: N/A;  1:55  . COLONOSCOPY W/ POLYPECTOMY  X 4, most recent 07/22/16   2017: tubular adenoma x 2 (recall 5 yrs--? age ?)  . CORONARY ARTERY BYPASS GRAFT  2011   L - LAD, S - diag, S - PDA/LV branch 2011.  Nl EF  . INGUINAL HERNIA REPAIR  1970s   right groin  . POLYPECTOMY  07/22/2016   Procedure: POLYPECTOMY;  Surgeon: Rogene Houston, MD;  Location: [facility];  Service: Endoscopy;;  hepatic flexure,  sigmoid colon,   . TONSILLECTOMY AND ADENOIDECTOMY  as a child  . UPPER GI ENDOSCOPY  09/03/2010   hiatus hernia, no Barrett's esoph,esoph stricture dilated, gastric ulcers (h pylori neg)   Family History  Problem Relation Age of Onset  . Stroke Mother   . Pneumonia Father   . Diabetes Father   . Coronary artery disease Brother 50    stent  . Diabetes Brother    History  Sexual Activity  . Sexual activity: Not on file    Outpatient Encounter Prescriptions as of 11/16/2016  Medication Sig  . acetaminophen (TYLENOL) 500 MG tablet Take 1,000 mg by mouth daily  as needed for moderate pain or headache.  [name] atorvastatin (LIPITOR) 40 MG tablet TAKE 1 TABLET EVERY DAY  . cetirizine (ZYRTEC) 10 MG tablet Take 10 mg by mouth as needed for allergies.   . fluticasone (FLONASE) 50 MCG/ACT nasal spray Place 2 sprays into both nostrils daily. (Patient taking differently: Place 2 sprays into both nostrils daily as needed for allergies. )  . ipratropium (ATROVENT) 0.06 % nasal spray Place 2 sprays into both nostrils 3 (three) times daily.  . metoprolol tartrate (LOPRESSOR) 25 MG tablet TAKE 1 TABLET TWICE DAILY  . Omega-3 Fatty Acids (FISH OIL PO) Take 360 mg by mouth every evening.   . triamcinolone ointment (TRIDERM) 0.1 % Apply 1 application topically 2 (two) times  daily as needed (eczema).   . warfarin (COUMADIN) 5 MG tablet TAKE AS DIRECTED BY MD (Patient taking differently: take 7.5mg  daily on Mon, Wed and Fri, take 5mg s daily on Tues, Thurs, Sat, and Sun)   No facility-administered encounter medications on file as of 11/16/2016.     Activities of Daily Living In your present state of health, do you have any difficulty performing the following activities: 11/16/2016  Hearing? N  Vision? N  Difficulty concentrating or making decisions? N  Walking or climbing stairs? N  Dressing or bathing? N  Doing errands, shopping? N  Preparing Food and eating ? N  Using the Toilet? N  In the past six months, have you accidently leaked urine? N  Do you have problems with loss of bowel control? N  Managing your Medications? N  Managing your Finances? N  Housekeeping or managing your Housekeeping? N  Some recent data might be hidden    Patient Care Team: Tammi Sou, MD as PCP - General (Family Medicine) Minus Breeding, MD as Consulting Physician (Cardiology) Katy Apo, MD as Consulting Physician (Ophthalmology) Butch Penny, NP as Nurse Practitioner (Gastroenterology) Rogene Houston, MD as Consulting Physician (Gastroenterology)   Assessment:    Physical assessment deferred to PCP.  Exercise Activities and Dietary recommendations Exercise limited by: None identified   Diet (meal preparation, eat out, water intake, caffeinated beverages, dairy products, fruits and vegetables): Drinks water.   Breakfast: cereal, coffee Lunch: sandwich Dinner: lean protein, vegetables.   Discussed heart healthy diet and encouraged to remain as active as possible.   Goals    . patient          To maintain current activity.       Fall Risk Fall Risk  11/16/2016 09/25/2015 07/14/2015 06/05/2015  Falls in the past year? No No No No  Risk for fall due to : - - Other (Comment) -   Depression Screen PHQ 2/9 Scores 11/16/2016 09/25/2015 07/14/2015 06/05/2015   PHQ - 2 Score 0 0 0 0  Exception Documentation - - Other- indicate reason in comment box -    Cognitive Function       Ad8 score reviewed for issues:  Issues making decisions: no  Less interest in hobbies / activities: no  Repeats questions, stories (family complaining): no  Trouble using ordinary gadgets (microwave, computer, phone): no  Forgets the month or year: no  Mismanaging finances: no  Remembering appts: no  Daily problems with thinking and/or memory: no Ad8 score is=0  Reads 30-40 books/year.    Immunization History  Administered Date(s) Administered  . Influenza, High Dose Seasonal PF 05/26/2015, 05/05/2016  . Influenza,inj,Quad PF,36+ Mos 04/19/2013, 06/10/2014  . Pneumococcal Conjugate-13 04/02/2014  . Tdap 04/02/2014  Screening Tests Health Maintenance  Topic Date Due  . PNA vac Low Risk Adult (2 of 2 - PPSV23) 04/03/2015  . INFLUENZA VACCINE  03/02/2017  . TETANUS/TDAP  04/02/2024      Plan:     Make dental appointment.   Continue doing brain stimulating activities (puzzles, reading, adult coloring books, staying active) to keep memory sharp.   Bring a copy of your advance directives to your next office visit.   During the course of the visit the patient was educated and counseled about the following appropriate screening and preventive services:   Vaccines to include Pneumoccal, Influenza, Hepatitis B, Td, Zostavax, HCV  Cardiovascular Disease  Colorectal cancer screening  Diabetes screening  Prostate Cancer Screening  Glaucoma screening  Nutrition counseling    Patient Instructions (the written plan) was given to the patient.    Gerilyn Nestle, RN  11/16/2016  __________________________________________________________ FYI---FLP obtained today.       ---Will schedule CPE with PCP (plans to schedule for timeframe of next PT/INR)       ---Continues to have occasional lower back pain.

## 2016-11-15 NOTE — Progress Notes (Signed)
Pre visit review using our clinic review tool, if applicable. No additional management support is needed unless otherwise documented below in the visit note. 

## 2016-11-16 ENCOUNTER — Other Ambulatory Visit (INDEPENDENT_AMBULATORY_CARE_PROVIDER_SITE_OTHER): Payer: Medicare HMO

## 2016-11-16 ENCOUNTER — Ambulatory Visit (INDEPENDENT_AMBULATORY_CARE_PROVIDER_SITE_OTHER): Payer: Medicare HMO

## 2016-11-16 VITALS — BP 112/64 | HR 80 | Ht 68.0 in | Wt 174.0 lb

## 2016-11-16 DIAGNOSIS — E785 Hyperlipidemia, unspecified: Secondary | ICD-10-CM | POA: Diagnosis not present

## 2016-11-16 DIAGNOSIS — I482 Chronic atrial fibrillation, unspecified: Secondary | ICD-10-CM

## 2016-11-16 DIAGNOSIS — Z0000 Encounter for general adult medical examination without abnormal findings: Secondary | ICD-10-CM | POA: Diagnosis not present

## 2016-11-16 LAB — LIPID PANEL
CHOLESTEROL: 139 mg/dL (ref 0–200)
HDL: 46.5 mg/dL (ref >39.00)
LDL Cholesterol: 75 mg/dL (ref 0–99)
NonHDL: 92.94
TRIGLYCERIDES: 91 mg/dL (ref 0.0–149.0)
Total CHOL/HDL Ratio: 3
VLDL: 18.2 mg/dL (ref 0.0–40.0)

## 2016-11-16 LAB — PROTIME-INR
INR: 2.4 ratio — AB (ref 0.8–1.0)
PROTHROMBIN TIME: 25.8 s — AB (ref 9.6–13.1)

## 2016-11-16 NOTE — Progress Notes (Signed)
AWV reviewed and agree.  Signed:  Crissie Sickles, MD           11/16/2016

## 2016-11-16 NOTE — Patient Instructions (Addendum)
Make dental appointment.   Continue doing brain stimulating activities (puzzles, reading, adult coloring books, staying active) to keep memory sharp.   Bring a copy of your advance directives to your next office visit.   Fall Prevention in the Home Falls can cause injuries. They can happen to people of all ages. There are many things you can do to make your home safe and to help prevent falls. What can I do on the outside of my home?  Regularly fix the edges of walkways and driveways and fix any cracks.  Remove anything that might make you trip as you walk through a door, such as a raised step or threshold.  Trim any bushes or trees on the path to your home.  Use bright outdoor lighting.  Clear any walking paths of anything that might make someone trip, such as rocks or tools.  Regularly check to see if handrails are loose or broken. Make sure that both sides of any steps have handrails.  Any raised decks and porches should have guardrails on the edges.  Have any leaves, snow, or ice cleared regularly.  Use sand or salt on walking paths during winter.  Clean up any spills in your garage right away. This includes oil or grease spills. What can I do in the bathroom?  Use night lights.  Install grab bars by the toilet and in the tub and shower. Do not use towel bars as grab bars.  Use non-skid mats or decals in the tub or shower.  If you need to sit down in the shower, use a plastic, non-slip stool.  Keep the floor dry. Clean up any water that spills on the floor as soon as it happens.  Remove soap buildup in the tub or shower regularly.  Attach bath mats securely with double-sided non-slip rug tape.  Do not have throw rugs and other things on the floor that can make you trip. What can I do in the bedroom?  Use night lights.  Make sure that you have a light by your bed that is easy to reach.  Do not use any sheets or blankets that are too big for your bed. They  should not hang down onto the floor.  Have a firm chair that has side arms. You can use this for support while you get dressed.  Do not have throw rugs and other things on the floor that can make you trip. What can I do in the kitchen?  Clean up any spills right away.  Avoid walking on wet floors.  Keep items that you use a lot in easy-to-reach places.  If you need to reach something above you, use a strong step stool that has a grab bar.  Keep electrical cords out of the way.  Do not use floor polish or wax that makes floors slippery. If you must use wax, use non-skid floor wax.  Do not have throw rugs and other things on the floor that can make you trip. What can I do with my stairs?  Do not leave any items on the stairs.  Make sure that there are handrails on both sides of the stairs and use them. Fix handrails that are broken or loose. Make sure that handrails are as long as the stairways.  Check any carpeting to make sure that it is firmly attached to the stairs. Fix any carpet that is loose or worn.  Avoid having throw rugs at the top or bottom of the stairs. If  you do have throw rugs, attach them to the floor with carpet tape.  Make sure that you have a light switch at the top of the stairs and the bottom of the stairs. If you do not have them, ask someone to add them for you. What else can I do to help prevent falls?  Wear shoes that:  Do not have high heels.  Have rubber bottoms.  Are comfortable and fit you well.  Are closed at the toe. Do not wear sandals.  If you use a stepladder:  Make sure that it is fully opened. Do not climb a closed stepladder.  Make sure that both sides of the stepladder are locked into place.  Ask someone to hold it for you, if possible.  Clearly mark and make sure that you can see:  Any grab bars or handrails.  First and last steps.  Where the edge of each step is.  Use tools that help you move around (mobility aids) if  they are needed. These include:  Canes.  Walkers.  Scooters.  Crutches.  Turn on the lights when you go into a dark area. Replace any light bulbs as soon as they burn out.  Set up your furniture so you have a clear path. Avoid moving your furniture around.  If any of your floors are uneven, fix them.  If there are any pets around you, be aware of where they are.  Review your medicines with your doctor. Some medicines can make you feel dizzy. This can increase your chance of falling. Ask your doctor what other things that you can do to help prevent falls. This information is not intended to replace advice given to you by your health care provider. Make sure you discuss any questions you have with your health care provider. Document Released: 05/15/2009 Document Revised: 12/25/2015 Document Reviewed: 08/23/2014 Elsevier Interactive Patient Education  2017 [place] Maintenance, Male A healthy lifestyle and preventive care is important for your health and wellness. Ask your health care provider about what schedule of regular examinations is right for you. What should I know about weight and diet?  Eat a Healthy Diet  Eat plenty of vegetables, fruits, whole grains, low-fat dairy products, and lean protein.  Do not eat a lot of foods high in solid fats, added sugars, or salt. Maintain a Healthy Weight  Regular exercise can help you achieve or maintain a healthy weight. You should:  Do at least 150 minutes of exercise each week. The exercise should increase your heart rate and make you sweat (moderate-intensity exercise).  Do strength-training exercises at least twice a week. Watch Your Levels of Cholesterol and Blood Lipids  Have your blood tested for lipids and cholesterol every 5 years starting at 81 years of age. If you are at high risk for heart disease, you should start having your blood tested when you are 81 years old. You may need to have your cholesterol  levels checked more often if:  Your lipid or cholesterol levels are high.  You are older than 81 years of age.  You are at high risk for heart disease. What should I know about cancer screening? Many types of cancers can be detected early and may often be prevented. Lung Cancer  You should be screened every year for lung cancer if:  You are a current smoker who has smoked for at least 30 years.  You are a former smoker who has quit within the past 15  years.  Talk to your health care provider about your screening options, when you should start screening, and how often you should be screened. Colorectal Cancer  Routine colorectal cancer screening usually begins at 81 years of age and should be repeated every 5-10 years until you are 81 years old. You may need to be screened more often if early forms of precancerous polyps or small growths are found. Your health care provider may recommend screening at an earlier age if you have risk factors for colon cancer.  Your health care provider may recommend using home test kits to check for hidden blood in the stool.  A small camera at the end of a tube can be used to examine your colon (sigmoidoscopy or colonoscopy). This checks for the earliest forms of colorectal cancer. Prostate and Testicular Cancer  Depending on your age and overall health, your health care provider may do certain tests to screen for prostate and testicular cancer.  Talk to your health care provider about any symptoms or concerns you have about testicular or prostate cancer. Skin Cancer  Check your skin from head to toe regularly.  Tell your health care provider about any new moles or changes in moles, especially if:  There is a change in a mole's size, shape, or color.  You have a mole that is larger than a pencil eraser.  Always use sunscreen. Apply sunscreen liberally and repeat throughout the day.  Protect yourself by wearing long sleeves, pants, a  wide-brimmed hat, and sunglasses when outside. What should I know about heart disease, diabetes, and high blood pressure?  If you are 90-32 years of age, have your blood pressure checked every 3-5 years. If you are 22 years of age or older, have your blood pressure checked every year. You should have your blood pressure measured twice-once when you are at a hospital or clinic, and once when you are not at a hospital or clinic. Record the average of the two measurements. To check your blood pressure when you are not at a hospital or clinic, you can use:  An automated blood pressure machine at a pharmacy.  A home blood pressure monitor.  Talk to your health care provider about your target blood pressure.  If you are between 33-5 years old, ask your health care provider if you should take aspirin to prevent heart disease.  Have regular diabetes screenings by checking your fasting blood sugar level.  If you are at a normal weight and have a low risk for diabetes, have this test once every three years after the age of 5.  If you are overweight and have a high risk for diabetes, consider being tested at a younger age or more often.  A one-time screening for abdominal aortic aneurysm (AAA) by ultrasound is recommended for men aged 94-75 years who are current or former smokers. What should I know about preventing infection? Hepatitis B  If you have a higher risk for hepatitis B, you should be screened for this virus. Talk with your health care provider to find out if you are at risk for hepatitis B infection. Hepatitis C  Blood testing is recommended for:  Everyone born from [birth year] through 1963.  Anyone with known risk factors for hepatitis C. Sexually Transmitted Diseases (STDs)  You should be screened each year for STDs including gonorrhea and chlamydia if:  You are sexually active and are younger than 81 years of age.  You are older than 81 years of age and  your health care provider  tells you that you are at risk for this type of infection.  Your sexual activity has changed since you were last screened and you are at an increased risk for chlamydia or gonorrhea. Ask your health care provider if you are at risk.  Talk with your health care provider about whether you are at high risk of being infected with HIV. Your health care provider may recommend a prescription medicine to help prevent HIV infection. What else can I do?  Schedule regular health, dental, and eye exams.  Stay current with your vaccines (immunizations).  Do not use any tobacco products, such as cigarettes, chewing tobacco, and e-cigarettes. If you need help quitting, ask your health care provider.  Limit alcohol intake to no more than 2 drinks per day. One drink equals 12 ounces of beer, 5 ounces of wine, or 1 ounces of hard liquor.  Do not use street drugs.  Do not share needles.  Ask your health care provider for help if you need support or information about quitting drugs.  Tell your health care provider if you often feel depressed.  Tell your health care provider if you have ever been abused or do not feel safe at home. This information is not intended to replace advice given to you by your health care provider. Make sure you discuss any questions you have with your health care provider. Document Released: 01/15/2008 Document Revised: 03/17/2016 Document Reviewed: 04/22/2015 Elsevier Interactive Patient Education  2017 [employer].

## 2016-12-15 ENCOUNTER — Other Ambulatory Visit (INDEPENDENT_AMBULATORY_CARE_PROVIDER_SITE_OTHER): Payer: Medicare HMO

## 2016-12-15 DIAGNOSIS — I482 Chronic atrial fibrillation, unspecified: Secondary | ICD-10-CM

## 2016-12-15 LAB — PROTIME-INR
INR: 3.2 ratio — ABNORMAL HIGH (ref 0.8–1.0)
Prothrombin Time: 33.8 s — ABNORMAL HIGH (ref 9.6–13.1)

## 2016-12-16 ENCOUNTER — Other Ambulatory Visit: Payer: Self-pay | Admitting: *Deleted

## 2016-12-16 DIAGNOSIS — I482 Chronic atrial fibrillation, unspecified: Secondary | ICD-10-CM

## 2016-12-30 ENCOUNTER — Other Ambulatory Visit (INDEPENDENT_AMBULATORY_CARE_PROVIDER_SITE_OTHER): Payer: Medicare HMO

## 2016-12-30 DIAGNOSIS — I482 Chronic atrial fibrillation, unspecified: Secondary | ICD-10-CM

## 2016-12-30 LAB — PROTIME-INR
INR: 2.5 ratio — AB (ref 0.8–1.0)
Prothrombin Time: 26.8 s — ABNORMAL HIGH (ref 9.6–13.1)

## 2016-12-31 ENCOUNTER — Other Ambulatory Visit: Payer: Self-pay | Admitting: *Deleted

## 2016-12-31 DIAGNOSIS — I482 Chronic atrial fibrillation, unspecified: Secondary | ICD-10-CM

## 2017-02-07 ENCOUNTER — Other Ambulatory Visit (INDEPENDENT_AMBULATORY_CARE_PROVIDER_SITE_OTHER): Payer: Medicare HMO

## 2017-02-07 DIAGNOSIS — I482 Chronic atrial fibrillation, unspecified: Secondary | ICD-10-CM

## 2017-02-07 LAB — PROTIME-INR
INR: 2 ratio — ABNORMAL HIGH (ref 0.8–1.0)
Prothrombin Time: 21.5 s — ABNORMAL HIGH (ref 9.6–13.1)

## 2017-02-08 ENCOUNTER — Other Ambulatory Visit: Payer: Self-pay | Admitting: *Deleted

## 2017-02-08 DIAGNOSIS — I482 Chronic atrial fibrillation, unspecified: Secondary | ICD-10-CM

## 2017-02-16 ENCOUNTER — Other Ambulatory Visit: Payer: Self-pay | Admitting: *Deleted

## 2017-02-16 DIAGNOSIS — I482 Chronic atrial fibrillation, unspecified: Secondary | ICD-10-CM

## 2017-02-16 DIAGNOSIS — I2510 Atherosclerotic heart disease of native coronary artery without angina pectoris: Secondary | ICD-10-CM

## 2017-02-16 MED ORDER — FLUTICASONE PROPIONATE 50 MCG/ACT NA SUSP: 2.0000 | Freq: Every day | NASAL | 0 refills | Status: DC | PRN

## 2017-02-16 NOTE — Telephone Encounter (Signed)
[employer]  RF request for fluticasone LOV: 09/25/15 Next ov: 11/18/17 Last written: 10/13/15 #48g w/ 3RF  RF request for atorvastatin Last written: 11/08/16 #90 w/ 1RF  I did not send RF for atorvastatin because pt should have just received a 90 day supply.   Sent fluticasone in for #48g w/ 0RF. Pt needs office visit for more refills. Pt advised and voiced understanding.    Apt made for 03/14/17 at 8:15am.

## 2017-03-11 ENCOUNTER — Other Ambulatory Visit: Payer: Self-pay | Admitting: Family Medicine

## 2017-03-11 NOTE — Telephone Encounter (Signed)
Duplicate refill encounter.  Medication was already sent to pharmacy.

## 2017-03-14 ENCOUNTER — Ambulatory Visit (INDEPENDENT_AMBULATORY_CARE_PROVIDER_SITE_OTHER): Payer: Medicare HMO | Admitting: Family Medicine

## 2017-03-14 ENCOUNTER — Encounter: Payer: Self-pay | Admitting: Family Medicine

## 2017-03-14 VITALS — BP 131/68 | HR 74 | Temp 97.6°F | Resp 16 | Ht 68.0 in | Wt 171.5 lb

## 2017-03-14 DIAGNOSIS — J301 Allergic rhinitis due to pollen: Secondary | ICD-10-CM

## 2017-03-14 DIAGNOSIS — I482 Chronic atrial fibrillation: Secondary | ICD-10-CM | POA: Diagnosis not present

## 2017-03-14 DIAGNOSIS — E7800 Pure hypercholesterolemia, unspecified: Secondary | ICD-10-CM

## 2017-03-14 DIAGNOSIS — I4821 Permanent atrial fibrillation: Secondary | ICD-10-CM

## 2017-03-14 DIAGNOSIS — J3489 Other specified disorders of nose and nasal sinuses: Secondary | ICD-10-CM

## 2017-03-14 DIAGNOSIS — I10 Essential (primary) hypertension: Secondary | ICD-10-CM | POA: Diagnosis not present

## 2017-03-14 LAB — COMPREHENSIVE METABOLIC PANEL
ALBUMIN: 4.2 g/dL (ref 3.5–5.2)
ALK PHOS: 48 U/L (ref 39–117)
ALT: 25 U/L (ref 0–53)
AST: 20 U/L (ref 0–37)
BUN: 13 mg/dL (ref 6–23)
CALCIUM: 9.1 mg/dL (ref 8.4–10.5)
CHLORIDE: 104 meq/L (ref 96–112)
CO2: 32 mEq/L (ref 19–32)
Creatinine, Ser: 0.86 mg/dL (ref 0.40–1.50)
GFR: 90.69 mL/min (ref >60.00)
Glucose, Bld: 121 mg/dL — ABNORMAL HIGH (ref 70–99)
POTASSIUM: 4.1 meq/L (ref 3.5–5.1)
SODIUM: 141 meq/L (ref 135–145)
TOTAL PROTEIN: 5.6 g/dL — AB (ref 6.0–8.3)
Total Bilirubin: 1.4 mg/dL — ABNORMAL HIGH (ref 0.2–1.2)

## 2017-03-14 LAB — PROTIME-INR
INR: 2 ratio — AB (ref 0.8–1.0)
PROTHROMBIN TIME: 21.1 s — AB (ref 9.6–13.1)

## 2017-03-14 MED ORDER — FLUTICASONE PROPIONATE 50 MCG/ACT NA SUSP: 2.0000 | Freq: Every day | NASAL | 3 refills | Status: DC | PRN

## 2017-03-14 MED ORDER — IPRATROPIUM BROMIDE 0.06 % NA SOLN: 2.0000 | Freq: Three times a day (TID) | NASAL | 3 refills | Status: DC

## 2017-03-14 NOTE — Progress Notes (Signed)
OFFICE VISIT  03/14/2017   CC:  Chief Complaint  Patient presents with  . Follow-up    [facility], pt is fasting.    HPI:    Patient is a 83 y.o. Caucasian male who presents for f/u HTN, HLD, permanent a-fib, and hx of IFG. He has a remote history of CAD and is s/p CABG in 2011.  BP:  Regular home monitoring 120s/50s-60s, HR 60s.  A-fib: HR 60s consistently.  No palpitations, racing heart, or dizziness. He is doing a couple miles on the treadmill a day lately. Stays busy with yard/house work.  HLD: taking atorvastatin daily, no side effects. Cutting back on portion size.  Seasonal allergic rhinitis: uses flonase irregularly but seems to get decent effect. Uses atrovent nasal spray for recurrent clear rhinorrhea that he feels may be coming as a side effect of his metoprolol.  He takes this twice a day and it helps very well.  Past Medical History:  Diagnosis Date  . Adenomatous colon polyp   . Allergic rhinitis    primarily seasonal  . Atrial fibrillation ([place])    peri op afib initially, then dx'd with permanent a-fib 2014 (xarelto started, switched to coumadin for cost reasons)  . CAD (coronary artery disease)   . Chronic idiopathic thrombocytopenia ([facility])    very mild  . Diverticulosis 2012 scope   pancolonic  . GERD (gastroesophageal reflux disease)   . Hearing impairment    [place]; has hearing aids  . Hypercholesteremia   . Hypertension   . IFG (impaired fasting glucose) 05/2015   A1c 5.8%  . Pneumonia   . Ulcerative esophagitis     Past Surgical History:  Procedure Laterality Date  . CARDIOVASCULAR STRESS TEST  12/2012   Nuclear stress (exercise) testing: low risk scan  . CATARACT EXTRACTION    . COLONOSCOPY N/A 07/22/2016   Procedure: COLONOSCOPY;  Surgeon: Rogene Houston, MD;  [facility];  Service: Endoscopy;  Laterality: N/A;  1:55  . COLONOSCOPY W/ POLYPECTOMY  X 4, most recent 07/22/16   2017: tubular adenoma x 2 (recall 5 yrs--? age ?)  . CORONARY  ARTERY BYPASS GRAFT  2011   L - LAD, S - diag, S - PDA/LV branch 2011.  Nl EF  . INGUINAL HERNIA REPAIR  1970s   right groin  . POLYPECTOMY  07/22/2016   Procedure: POLYPECTOMY;  Surgeon: Rogene Houston, MD;  Location: [facility];  Service: Endoscopy;;  hepatic flexure,  sigmoid colon,   . TONSILLECTOMY AND ADENOIDECTOMY  as a child  . UPPER GI ENDOSCOPY  09/03/2010   hiatus hernia, no Barrett's esoph,esoph stricture dilated, gastric ulcers (h pylori neg)    Outpatient Medications Prior to Visit  Medication Sig Dispense Refill  . acetaminophen (TYLENOL) 500 MG tablet Take 1,000 mg by mouth daily as needed for moderate pain or headache.    [name] atorvastatin (LIPITOR) 40 MG tablet TAKE 1 TABLET EVERY DAY 90 tablet 1  . cetirizine (ZYRTEC) 10 MG tablet Take 10 mg by mouth as needed for allergies.     . metoprolol tartrate (LOPRESSOR) 25 MG tablet TAKE 1 TABLET TWICE DAILY 180 tablet 3  . Omega-3 Fatty Acids (FISH OIL PO) Take 360 mg by mouth every evening.     . triamcinolone ointment (TRIDERM) 0.1 % Apply 1 application topically 2 (two) times daily as needed (eczema).     . warfarin (COUMADIN) 5 MG tablet TAKE AS DIRECTED BY MD (Patient taking differently: take 7.5mg   daily on Mon, Wed, take 5mg s daily on Tues, Thurs, Fri, Sat, and Sun) 118 tablet 4  . fluticasone (FLONASE) 50 MCG/ACT nasal spray Place 2 sprays into both nostrils daily as needed for allergies. 48 g 0  . ipratropium (ATROVENT) 0.06 % nasal spray Place 2 sprays into both nostrils 3 (three) times daily. 15 mL 1   No facility-administered medications prior to visit.     Allergies  Allergen Reactions  . Sulfa Antibiotics Itching    Cannot remember what kind of reaction    ROS As per HPI  PE: Blood pressure 131/68, pulse 74, temperature 97.6 F (36.4 C), temperature source Oral, resp. rate 16, height 5\' 8"  (1.727 m), weight 171 lb 8 oz (77.8 kg), SpO2 98 %. Gen: Alert, well appearing.  Patient is oriented to person,  place, time, and situation. AFFECT: pleasant, lucid thought and speech. CV: irreg irreg, rate around 60, no m/r Chest is clear, no wheezing or rales. Normal symmetric air entry throughout both lung fields. No chest wall deformities or tenderness. EXT: no clubbing or cyanosis.  Bilat 1+ pitting edema noted.  LABS:  Lab Results  Component Value Date   TSH 1.99 05/26/2015   Lab Results  Component Value Date   WBC 7.0 05/26/2015   HGB 16.1 05/26/2015   HCT 48.7 05/26/2015   MCV 93.1 05/26/2015   PLT 134.0 (L) 05/26/2015   Lab Results  Component Value Date   CREATININE 0.87 05/26/2015   BUN 12 05/26/2015   NA 142 05/26/2015   K 4.6 05/26/2015   CL 105 05/26/2015   CO2 30 05/26/2015   Lab Results  Component Value Date   ALT 24 05/26/2015   AST 20 05/26/2015   ALKPHOS 49 05/26/2015   BILITOT 1.3 (H) 05/26/2015   Lab Results  Component Value Date   CHOL 139 11/16/2016   Lab Results  Component Value Date   HDL 46.50 11/16/2016   Lab Results  Component Value Date   LDLCALC 75 11/16/2016   Lab Results  Component Value Date   TRIG 91.0 11/16/2016   Lab Results  Component Value Date   CHOLHDL 3 11/16/2016   Lab Results  Component Value Date   PSA 1.43 05/26/2015   PSA 1.01 03/20/2013   Lab Results  Component Value Date   HGBA1C 5.8 05/27/2015   Lab Results  Component Value Date   INR 2.0 (H) 02/07/2017   INR 2.5 (H) 12/30/2016   INR 3.2 (H) 12/15/2016   IMPRESSION AND PLAN:  1) HTN; The current medical regimen is effective;  continue present plan and medications. Lytes/cr today.  2) Hyperlipidemia: lipid panel good 10/2016.  Tolerating statin. AST/ALT today.  3) Permanent atrial fibrillation: rate control with lopressor 25mg  bid, anticoag with coumadin. Check PT/INR today.  4) Seasonal allergic rhinitis and unrelated rhinorrhea. Responds to flonase and atrovent nasal spray, both of which were refilled today.  An After Visit Summary was printed and  given to the patient.  FOLLOW UP: Return in about 6 months (around 09/14/2017) for annual CPE (fasting).  Signed:  Crissie Sickles, MD           03/14/2017

## 2017-03-15 ENCOUNTER — Other Ambulatory Visit (INDEPENDENT_AMBULATORY_CARE_PROVIDER_SITE_OTHER): Payer: Medicare HMO

## 2017-03-15 ENCOUNTER — Other Ambulatory Visit: Payer: Self-pay | Admitting: *Deleted

## 2017-03-15 ENCOUNTER — Encounter: Payer: Self-pay | Admitting: Family Medicine

## 2017-03-15 DIAGNOSIS — I482 Chronic atrial fibrillation, unspecified: Secondary | ICD-10-CM

## 2017-03-15 DIAGNOSIS — R7301 Impaired fasting glucose: Secondary | ICD-10-CM

## 2017-03-15 LAB — HEMOGLOBIN A1C: HEMOGLOBIN A1C: 6 % (ref 4.6–6.5)

## 2017-03-29 NOTE — Progress Notes (Signed)
HPI The patient presents for follow up of CAD.  He has a history of CAD s/p CABG.  He has permanent atrial fib.   Since I last saw him he has done well.  He is exercising.  The patient denies any new symptoms such as chest discomfort, neck or arm discomfort. There has been no new shortness of breath, PND or orthopnea. There have been no reported palpitations, presyncope or syncope.  Allergies  Allergen Reactions  . Sulfa Antibiotics Itching    Cannot remember what kind of reaction    Current Outpatient Prescriptions  Medication Sig Dispense Refill  . acetaminophen (TYLENOL) 500 MG tablet Take 1,000 mg by mouth daily as needed for moderate pain or headache.    [name] atorvastatin (LIPITOR) 40 MG tablet TAKE 1 TABLET EVERY DAY 90 tablet 1  . cetirizine (ZYRTEC) 10 MG tablet Take 10 mg by mouth as needed for allergies.     . fluticasone (FLONASE) 50 MCG/ACT nasal spray Place 2 sprays into both nostrils daily as needed for allergies. 48 g 3  . ipratropium (ATROVENT) 0.06 % nasal spray Place 2 sprays into both nostrils 3 (three) times daily. 45 mL 3  . metoprolol tartrate (LOPRESSOR) 25 MG tablet Take 25 mg by mouth 2 (two) times daily.    . Omega-3 Fatty Acids (FISH OIL PO) Take 360 mg by mouth every evening.     . triamcinolone ointment (TRIDERM) 0.1 % Apply 1 application topically 2 (two) times daily as needed (eczema).     . warfarin (COUMADIN) 5 MG tablet TAKE AS DIRECTED BY MD (Patient taking differently: take 7.5mg  daily on Mon, Wed, take 5mg s daily on Tues, Thurs, Fri, Sat, and Sun) 118 tablet 4   No current facility-administered medications for this visit.     Past Medical History:  Diagnosis Date  . Adenomatous colon polyp   . Allergic rhinitis    primarily seasonal  . Atrial fibrillation ([place])    peri op afib initially, then dx'd with permanent a-fib 2014 (xarelto started, switched to coumadin for cost reasons)  . CAD (coronary artery disease)   . Chronic idiopathic  thrombocytopenia ([facility])    very mild  . Diverticulosis 2012 scope   pancolonic  . GERD (gastroesophageal reflux disease)   . Hearing impairment    [place]; has hearing aids  . Hypercholesteremia   . Hypertension   . IFG (impaired fasting glucose) 05/2015; 03/2017   2016 A1c 5.8%.  2018 A1c 6.0%.  . Pneumonia   . Ulcerative esophagitis     Past Surgical History:  Procedure Laterality Date  . CARDIOVASCULAR STRESS TEST  12/2012   Nuclear stress (exercise) testing: low risk scan  . CATARACT EXTRACTION    . COLONOSCOPY N/A 07/22/2016   Procedure: COLONOSCOPY;  Surgeon: Rogene Houston, MD;  [facility];  Service: Endoscopy;  Laterality: N/A;  1:55  . COLONOSCOPY W/ POLYPECTOMY  X 4, most recent 07/22/16   2017: tubular adenoma x 2 (recall 5 yrs--? age ?)  . CORONARY ARTERY BYPASS GRAFT  2011   L - LAD, S - diag, S - PDA/LV branch 2011.  Nl EF  . INGUINAL HERNIA REPAIR  1970s   right groin  . POLYPECTOMY  07/22/2016   Procedure: POLYPECTOMY;  Surgeon: Rogene Houston, MD;  [facility];  Service: Endoscopy;;  hepatic flexure,  sigmoid colon,   . TONSILLECTOMY AND ADENOIDECTOMY  as a child  . UPPER GI ENDOSCOPY  09/03/2010  hiatus hernia, no Barrett's esoph,esoph stricture dilated, gastric ulcers (h pylori neg)    ROS:  As stated in the HPI and negative for all other systems.  PHYSICAL EXAM BP 138/78   Pulse 68   Ht 5\' 8"  (1.727 m)   Wt 169 lb (76.7 kg)   BMI 25.70 kg/m   GENERAL:  Well appearing NECK:  No jugular venous distention, waveform within normal limits, carotid upstroke brisk and symmetric, no bruits, no thyromegaly LUNGS:  Clear to auscultation bilaterally CHEST:  Unremarkable HEART:  PMI not displaced or sustained,S1 and S2 within normal limits, no S3, no clicks, no rubs, no murmurs, irregular ABD:  Flat, positive bowel sounds normal in frequency in pitch, no bruits, no rebound, no guarding, no midline pulsatile mass, no hepatomegaly, no  splenomegaly EXT:  2 plus pulses throughout, no edema, no cyanosis no clubbing,varicose veins   EKG:  Atrial fib, rate 67, poor anterior all her progression, no acute ST-T wave changes.  03/30/2017  Lab Results  Component Value Date   CHOL 139 11/16/2016   TRIG 91.0 11/16/2016   HDL 46.50 11/16/2016   LDLCALC 75 11/16/2016    ASSESSMENT AND PLAN  ATRIAL FIBRILLATION: Mr. Shogo Larkey Salvo Sr. has a CHA2DS2 - VASc score of 4 with a risk of stroke of 4%.  The patient  tolerates this rhythm and rate control and anticoagulation. We will continue with the meds as listed.    CAD: The patient has no new sypmtoms.  No further cardiovascular testing is indicated.  We will continue with aggressive risk reduction and meds as listed.  HTN:  The blood pressure is at target.  No change in therapy.   HYPERLIPIDEMIA:  This is excellent as above.  He will continue meds as listed.

## 2017-03-30 ENCOUNTER — Encounter: Payer: Self-pay | Admitting: Cardiology

## 2017-03-30 ENCOUNTER — Ambulatory Visit (INDEPENDENT_AMBULATORY_CARE_PROVIDER_SITE_OTHER): Payer: Medicare HMO | Admitting: Cardiology

## 2017-03-30 VITALS — BP 138/78 | HR 68 | Ht 68.0 in | Wt 169.0 lb

## 2017-03-30 DIAGNOSIS — I2510 Atherosclerotic heart disease of native coronary artery without angina pectoris: Secondary | ICD-10-CM

## 2017-03-30 DIAGNOSIS — I482 Chronic atrial fibrillation: Secondary | ICD-10-CM

## 2017-03-30 DIAGNOSIS — I10 Essential (primary) hypertension: Secondary | ICD-10-CM | POA: Diagnosis not present

## 2017-03-30 DIAGNOSIS — E785 Hyperlipidemia, unspecified: Secondary | ICD-10-CM | POA: Diagnosis not present

## 2017-03-30 DIAGNOSIS — I4821 Permanent atrial fibrillation: Secondary | ICD-10-CM

## 2017-03-30 NOTE — Patient Instructions (Signed)

## 2017-04-13 ENCOUNTER — Other Ambulatory Visit: Payer: Self-pay | Admitting: Family Medicine

## 2017-04-13 ENCOUNTER — Other Ambulatory Visit (INDEPENDENT_AMBULATORY_CARE_PROVIDER_SITE_OTHER): Payer: Medicare HMO

## 2017-04-13 DIAGNOSIS — I482 Chronic atrial fibrillation, unspecified: Secondary | ICD-10-CM

## 2017-04-13 DIAGNOSIS — I2510 Atherosclerotic heart disease of native coronary artery without angina pectoris: Secondary | ICD-10-CM

## 2017-04-13 LAB — PROTIME-INR
INR: 2.3 ratio — ABNORMAL HIGH (ref 0.8–1.0)
Prothrombin Time: 25.1 s — ABNORMAL HIGH (ref 9.6–13.1)

## 2017-04-14 ENCOUNTER — Encounter: Payer: Self-pay | Admitting: *Deleted

## 2017-05-13 ENCOUNTER — Ambulatory Visit (INDEPENDENT_AMBULATORY_CARE_PROVIDER_SITE_OTHER): Payer: Medicare HMO

## 2017-05-13 ENCOUNTER — Other Ambulatory Visit (INDEPENDENT_AMBULATORY_CARE_PROVIDER_SITE_OTHER): Payer: Medicare HMO

## 2017-05-13 DIAGNOSIS — I482 Chronic atrial fibrillation, unspecified: Secondary | ICD-10-CM

## 2017-05-13 DIAGNOSIS — Z23 Encounter for immunization: Secondary | ICD-10-CM

## 2017-05-13 LAB — PROTIME-INR
INR: 2.6 ratio — AB (ref 0.8–1.0)
PROTHROMBIN TIME: 27.9 s — AB (ref 9.6–13.1)

## 2017-05-16 ENCOUNTER — Ambulatory Visit: Payer: Medicare HMO

## 2017-05-16 ENCOUNTER — Other Ambulatory Visit: Payer: Medicare HMO

## 2017-06-20 ENCOUNTER — Other Ambulatory Visit (INDEPENDENT_AMBULATORY_CARE_PROVIDER_SITE_OTHER): Payer: Medicare HMO

## 2017-06-20 DIAGNOSIS — I482 Chronic atrial fibrillation, unspecified: Secondary | ICD-10-CM

## 2017-06-20 LAB — PROTIME-INR
INR: 2.1 ratio — ABNORMAL HIGH (ref 0.8–1.0)
PROTHROMBIN TIME: 22.6 s — AB (ref 9.6–13.1)

## 2017-07-21 ENCOUNTER — Other Ambulatory Visit (INDEPENDENT_AMBULATORY_CARE_PROVIDER_SITE_OTHER): Payer: Medicare HMO

## 2017-07-21 DIAGNOSIS — I482 Chronic atrial fibrillation, unspecified: Secondary | ICD-10-CM

## 2017-07-21 LAB — PROTIME-INR
INR: 2.6 ratio — AB (ref 0.8–1.0)
PROTHROMBIN TIME: 28.1 s — AB (ref 9.6–13.1)

## 2017-07-22 ENCOUNTER — Other Ambulatory Visit: Payer: Self-pay

## 2017-07-22 DIAGNOSIS — Z7901 Long term (current) use of anticoagulants: Secondary | ICD-10-CM

## 2017-07-22 NOTE — Progress Notes (Unsigned)
Lab order placed for future as directed

## 2017-08-25 ENCOUNTER — Other Ambulatory Visit: Payer: Self-pay

## 2017-08-25 DIAGNOSIS — I482 Chronic atrial fibrillation, unspecified: Secondary | ICD-10-CM

## 2017-08-26 ENCOUNTER — Other Ambulatory Visit: Payer: Self-pay | Admitting: Family Medicine

## 2017-08-26 ENCOUNTER — Other Ambulatory Visit (INDEPENDENT_AMBULATORY_CARE_PROVIDER_SITE_OTHER): Payer: Medicare HMO

## 2017-08-26 DIAGNOSIS — I482 Chronic atrial fibrillation, unspecified: Secondary | ICD-10-CM

## 2017-08-26 LAB — PROTIME-INR
INR: 2.1 ratio — AB (ref 0.8–1.0)
PROTHROMBIN TIME: 22.7 s — AB (ref 9.6–13.1)

## 2017-08-30 ENCOUNTER — Other Ambulatory Visit: Payer: Self-pay

## 2017-08-30 DIAGNOSIS — I482 Chronic atrial fibrillation, unspecified: Secondary | ICD-10-CM

## 2017-09-12 ENCOUNTER — Other Ambulatory Visit: Payer: Self-pay | Admitting: Family Medicine

## 2017-09-12 DIAGNOSIS — I482 Chronic atrial fibrillation, unspecified: Secondary | ICD-10-CM

## 2017-09-12 DIAGNOSIS — I2510 Atherosclerotic heart disease of native coronary artery without angina pectoris: Secondary | ICD-10-CM

## 2017-09-26 ENCOUNTER — Other Ambulatory Visit: Payer: Medicare HMO

## 2017-09-28 ENCOUNTER — Other Ambulatory Visit (INDEPENDENT_AMBULATORY_CARE_PROVIDER_SITE_OTHER): Payer: Medicare HMO

## 2017-09-28 DIAGNOSIS — I482 Chronic atrial fibrillation, unspecified: Secondary | ICD-10-CM

## 2017-09-28 LAB — PROTIME-INR
INR: 2.1 ratio — ABNORMAL HIGH (ref 0.8–1.0)
PROTHROMBIN TIME: 22.4 s — AB (ref 9.6–13.1)

## 2017-09-29 ENCOUNTER — Other Ambulatory Visit: Payer: Self-pay

## 2017-09-29 DIAGNOSIS — I482 Chronic atrial fibrillation, unspecified: Secondary | ICD-10-CM

## 2017-10-24 ENCOUNTER — Other Ambulatory Visit (INDEPENDENT_AMBULATORY_CARE_PROVIDER_SITE_OTHER): Payer: Medicare HMO

## 2017-10-24 DIAGNOSIS — I482 Chronic atrial fibrillation, unspecified: Secondary | ICD-10-CM

## 2017-10-24 LAB — PROTIME-INR
INR: 2.4 ratio — AB (ref 0.8–1.0)
PROTHROMBIN TIME: 25.8 s — AB (ref 9.6–13.1)

## 2017-10-25 ENCOUNTER — Encounter: Payer: Self-pay | Admitting: *Deleted

## 2017-11-04 DIAGNOSIS — H5213 Myopia, bilateral: Secondary | ICD-10-CM | POA: Diagnosis not present

## 2017-11-04 DIAGNOSIS — H35371 Puckering of macula, right eye: Secondary | ICD-10-CM | POA: Diagnosis not present

## 2017-11-18 ENCOUNTER — Ambulatory Visit: Payer: Medicare HMO

## 2017-11-25 ENCOUNTER — Encounter: Payer: Self-pay | Admitting: *Deleted

## 2017-11-28 NOTE — Progress Notes (Signed)
Subjective:   Angel W Thorington Sr. is a 84 y.o. male who presents for Medicare Annual/Subsequent preventive examination.  Review of Systems:  No ROS.  Medicare Wellness Visit. Additional risk factors are reflected in the social history.  Cardiac Risk Factors include: advanced age (>18men, >84 women);dyslipidemia;male gender;hypertension;family history of premature cardiovascular disease   Sleep patterns: Sleeps 6 hours. Up x 4 to void.  Home Safety/Smoke Alarms: Feels safe in home. Smoke alarms in place.  Living environment; residence and Firearm Safety: Lives with wife in 1 story room, 3 steps at door with [place] Safety/Bike Helmet: Wears seat belt.   Male:   CCS-Colonoscopy 07/22/2016, polyps. No recall.  PSA-  Lab Results  Component Value Date   PSA 1.43 05/26/2015   PSA 1.01 03/20/2013       Objective:    Vitals: BP 110/60 (BP Location: Left Arm, Patient Position: Sitting, Cuff Size: Normal)   Pulse 65   Ht 5\' 8"  (1.727 m)   Wt 166 lb 12 oz (75.6 kg)   SpO2 97%   BMI 25.35 kg/m   Body mass index is 25.35 kg/m.  Advanced Directives 11/29/2017 11/16/2016 07/22/2016 07/14/2015 07/14/2015  Does Patient Have a Medical Advance Directive? Yes No No No No  Type of Paramedic of [place];Living will - - - -  Copy of [place] in Chart? No - copy requested - - - -  Would patient like information on creating a medical advance directive? No - Patient declined No - Patient declined No - Patient declined No - patient declined information No - patient declined information    Tobacco Social History   Tobacco Use  Smoking Status Never Smoker  Smokeless Tobacco Never Used     Counseling given: Not Answered    Past Medical History:  Diagnosis Date  . Adenomatous colon polyp   . Allergic rhinitis    primarily seasonal  . Atrial fibrillation ([place])    peri op afib initially, then dx'd with permanent a-fib 2014 (xarelto  started, switched to coumadin for cost reasons)  . CAD (coronary artery disease)   . Chronic idiopathic thrombocytopenia ([facility])    very mild  . Diverticulosis 2012 scope   pancolonic  . GERD (gastroesophageal reflux disease)   . Hearing impairment    AU; has hearing aids  . Hypercholesteremia   . Hypertension   . IFG (impaired fasting glucose) 05/2015; 03/2017   2016 A1c 5.8%.  2018 A1c 6.0%.  . Pneumonia   . Ulcerative esophagitis    Past Surgical History:  Procedure Laterality Date  . CARDIOVASCULAR STRESS TEST  12/2012   Nuclear stress (exercise) testing: low risk scan  . CATARACT EXTRACTION    . COLONOSCOPY N/A 07/22/2016   Procedure: COLONOSCOPY;  Surgeon: Rogene Houston, MD;  [facility];  Service: Endoscopy;  Laterality: N/A;  1:55  . COLONOSCOPY W/ POLYPECTOMY  X 4, most recent 07/22/16   2017: tubular adenoma x 2 (recall 5 yrs--? age ?)  . CORONARY ARTERY BYPASS GRAFT  2011   L - LAD, S - diag, S - PDA/LV branch 2011.  Nl EF  . INGUINAL HERNIA REPAIR  1970s   right groin  . POLYPECTOMY  07/22/2016   Procedure: POLYPECTOMY;  Surgeon: Rogene Houston, MD;  Location: [facility];  Service: Endoscopy;;  hepatic flexure,  sigmoid colon,   . TONSILLECTOMY AND ADENOIDECTOMY  as a child  . UPPER GI ENDOSCOPY  09/03/2010   hiatus hernia, no Barrett's esoph,esoph stricture dilated, gastric ulcers (h pylori neg)   Family History  Problem Relation Age of Onset  . Stroke Mother   . Pneumonia Father   . Diabetes Father   . Coronary artery disease Brother 50       stent  . Diabetes Brother    Social History   Socioeconomic History  . Marital status: Married    Spouse name: Not on file  . Number of children: 5  . Years of education: Not on file  . Highest education level: Not on file  Occupational History  . Occupation: Retired  Scientific laboratory technician  . Financial resource strain: Not on file  . Food insecurity:    Worry: Not on file    Inability: Not on file  .  Transportation needs:    Medical: Not on file    Non-medical: Not on file  Tobacco Use  . Smoking status: Never Smoker  . Smokeless tobacco: Never Used  Substance and Sexual Activity  . Alcohol use: No    Alcohol/week: 0.0 oz  . Drug use: No  . Sexual activity: Not on file  Lifestyle  . Physical activity:    Days per week: Not on file    Minutes per session: Not on file  . Stress: Not on file  Relationships  . Social connections:    Talks on phone: Not on file    Gets together: Not on file    Attends religious service: Not on file    Active member of club or organization: Not on file    Attends meetings of clubs or organizations: Not on file    Relationship status: Not on file  Other Topics Concern  . Not on file  Social History Narrative   Married, 5 children, 10 grandchildren.   Relocated to [place] from [place] in 2012 for warmer climate.  Orig from [place], [place].   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   Hobby: woodworking.   No T/A/Ds.   Exercise: treadmill and stationary bike--issue with knee temporarily got him out of routine--set to restart 03/2013.   Diet: prudent diet but nothing drastic.          Outpatient Encounter Medications as of 11/29/2017  Medication Sig  . acetaminophen (TYLENOL) 500 MG tablet Take 1,000 mg by mouth daily as needed for moderate pain or headache.  [name] atorvastatin (LIPITOR) 40 MG tablet TAKE 1 TABLET EVERY DAY  . cetirizine (ZYRTEC) 10 MG tablet Take 10 mg by mouth as needed for allergies.   . fluticasone (FLONASE) 50 MCG/ACT nasal spray Place 2 sprays into both nostrils daily as needed for allergies.  [name] ipratropium (ATROVENT) 0.06 % nasal spray Place 2 sprays into both nostrils 3 (three) times daily.  . metoprolol tartrate (LOPRESSOR) 25 MG tablet TAKE 1 TABLET TWICE DAILY  . Omega-3 Fatty Acids (FISH OIL PO) Take 360 mg by mouth every evening.   . triamcinolone ointment (TRIDERM) 0.1 % Apply 1 application topically 2  (two) times daily as needed (eczema).   . warfarin (COUMADIN) 5 MG tablet TAKE AS DIRECTED BY MD  . Zoster Vaccine Adjuvanted [facility]) injection Inject 0.5 mLs into the muscle once for 1 dose.   No facility-administered encounter medications on file as of 11/29/2017.     Activities of Daily Living In your present state of health, do you have any difficulty performing the following activities: 11/29/2017  Hearing? N  Vision? N  Difficulty concentrating  or making decisions? N  Walking or climbing stairs? N  Dressing or bathing? N  Doing errands, shopping? N  Preparing Food and eating ? N  Using the Toilet? N  In the past six months, have you accidently leaked urine? N  Do you have problems with loss of bowel control? N  Managing your Medications? N  Managing your Finances? N  Housekeeping or managing your Housekeeping? N  Some recent data might be hidden    Patient Care Team: Tammi Sou, MD as PCP - General (Family Medicine) Minus Breeding, MD as Consulting Physician (Cardiology) Katy Apo, MD as Consulting Physician (Ophthalmology) Vaughan Basta, Rona Ravens, NP as Nurse Practitioner (Gastroenterology) Rogene Houston, MD as Consulting Physician (Gastroenterology)   Assessment:   This is a routine wellness examination for Angel Ward.  Exercise Activities and Dietary recommendations Current Exercise Habits: The patient does not participate in regular exercise at present(Active with house and yard work; gardeing), Exercise limited by: None identified   Diet (meal preparation, eat out, water intake, caffeinated beverages, dairy products, fruits and vegetables): Drinks water.   Breakfast: cereal; bacon/eggs, coffee (2-3 cups) Lunch: Sandwich, leftovers Dinner: protein and vegetables.   Encouraged heart healthy diet and increasing activity. Has recumbent bike and treadmill at home.   Goals    . Increase physical activity     Increase activity by walking more.        Fall  Risk Fall Risk  11/29/2017 11/16/2016 09/25/2015 07/14/2015 06/05/2015  Falls in the past year? No No No No No  Risk for fall due to : - - - Other (Comment) -    Depression Screen PHQ 2/9 Scores 11/29/2017 11/16/2016 09/25/2015 07/14/2015  PHQ - 2 Score 0 0 0 0  Exception Documentation - - - Other- indicate reason in comment box    Cognitive Function MMSE - Mini Mental State Exam 11/29/2017  Orientation to time 5  Orientation to Place 5  Registration 3  Attention/ Calculation 5  Recall 2  Language- name 2 objects 2  Language- repeat 1  Language- follow 3 step command 3  Language- read & follow direction 1  Write a sentence 1  Copy design 1  Total score 29        Immunization History  Administered Date(s) Administered  . Influenza, High Dose Seasonal PF 05/26/2015, 05/05/2016, 05/13/2017  . Influenza,inj,Quad PF,6+ Mos 04/19/2013, 06/10/2014  . Pneumococcal Conjugate-13 04/02/2014  . Pneumococcal Polysaccharide-23 01/30/2009  . Tdap 04/02/2014     Screening Tests Health Maintenance  Topic Date Due  . INFLUENZA VACCINE  03/02/2018  . TETANUS/TDAP  04/02/2024  . PNA vac Low Risk Adult  Completed        Plan:     Shingles vaccine at pharmacy.   Bring a copy of your living will and/or healthcare power of attorney to your next office visit.  Continue doing brain stimulating activities (puzzles, reading, adult coloring books, staying active) to keep memory sharp.   I have personally reviewed and noted the following in the patient's chart:   . Medical and social history  . Use of alcohol, tobacco or illicit drugs  . Current medications and supplements . Functional ability and status . Nutritional status . Physical activity . Advanced directives . List of other physicians . Hospitalizations, surgeries, and ER visits in previous 12 months . Vitals . Screenings to include cognitive, depression, and falls . Referrals and appointments  In addition, I have reviewed  and discussed with patient certain preventive  protocols, quality metrics, and best practice recommendations. A written personalized care plan for preventive services as well as general preventive health recommendations were provided to patient.     Gerilyn Nestle, RN  11/29/2017  PCP Notes: -Pt c/o left shoulder/neck pain > 1 month.  -F/U with PCP for CPE 12/23/17 (fasting)

## 2017-11-29 ENCOUNTER — Telehealth: Payer: Self-pay

## 2017-11-29 ENCOUNTER — Other Ambulatory Visit: Payer: Self-pay

## 2017-11-29 ENCOUNTER — Other Ambulatory Visit (INDEPENDENT_AMBULATORY_CARE_PROVIDER_SITE_OTHER): Payer: Medicare HMO

## 2017-11-29 ENCOUNTER — Ambulatory Visit (INDEPENDENT_AMBULATORY_CARE_PROVIDER_SITE_OTHER): Payer: Medicare HMO

## 2017-11-29 VITALS — BP 110/60 | HR 65 | Ht 68.0 in | Wt 166.8 lb

## 2017-11-29 DIAGNOSIS — I482 Chronic atrial fibrillation, unspecified: Secondary | ICD-10-CM

## 2017-11-29 DIAGNOSIS — Z23 Encounter for immunization: Secondary | ICD-10-CM

## 2017-11-29 DIAGNOSIS — Z0000 Encounter for general adult medical examination without abnormal findings: Secondary | ICD-10-CM | POA: Diagnosis not present

## 2017-11-29 LAB — PROTIME-INR
INR: 2.3 ratio — ABNORMAL HIGH (ref 0.8–1.0)
Prothrombin Time: 26.2 s — ABNORMAL HIGH (ref 9.6–13.1)

## 2017-11-29 MED ORDER — ZOSTER VAC RECOMB ADJUVANTED 50 MCG/0.5ML IM SUSR: 0.5000 mL | Freq: Once | INTRAMUSCULAR | 1 refills | Status: AC

## 2017-11-29 MED ORDER — IPRATROPIUM BROMIDE 0.06 % NA SOLN: 2.0000 | Freq: Three times a day (TID) | NASAL | 3 refills | Status: DC

## 2017-11-29 NOTE — Telephone Encounter (Signed)
OK, sent this spray to [facility] Mail order pharmacy.

## 2017-11-29 NOTE — Telephone Encounter (Signed)
Pt in for AWV today, requesting refill for Atrovent Nasal Spray. Pt states PCP has refilled in the past (original Rx from ENT).

## 2017-11-29 NOTE — Patient Instructions (Addendum)
Shingles vaccine at pharmacy.   Bring a copy of your living will and/or healthcare power of attorney to your next office visit.  Continue doing brain stimulating activities (puzzles, reading, adult coloring books, staying active) to keep memory sharp.    Health Maintenance, Male A healthy lifestyle and preventive care is important for your health and wellness. Ask your health care provider about what schedule of regular examinations is right for you. What should I know about weight and diet? Eat a Healthy Diet  Eat plenty of vegetables, fruits, whole grains, low-fat dairy products, and lean protein.  Do not eat a lot of foods high in solid fats, added sugars, or salt.  Maintain a Healthy Weight Regular exercise can help you achieve or maintain a healthy weight. You should:  Do at least 150 minutes of exercise each week. The exercise should increase your heart rate and make you sweat (moderate-intensity exercise).  Do strength-training exercises at least twice a week.  Watch Your Levels of Cholesterol and Blood Lipids  Have your blood tested for lipids and cholesterol every 5 years starting at 82 years of age. If you are at high risk for heart disease, you should start having your blood tested when you are 82 years old. You may need to have your cholesterol levels checked more often if: ? Your lipid or cholesterol levels are high. ? You are older than 82 years of age. ? You are at high risk for heart disease.  What should I know about cancer screening? Many types of cancers can be detected early and may often be prevented. Lung Cancer  You should be screened every year for lung cancer if: ? You are a current smoker who has smoked for at least 30 years. ? You are a former smoker who has quit within the past 15 years.  Talk to your health care provider about your screening options, when you should start screening, and how often you should be screened.  Colorectal  Cancer  Routine colorectal cancer screening usually begins at 82 years of age and should be repeated every 5-10 years until you are 82 years old. You may need to be screened more often if early forms of precancerous polyps or small growths are found. Your health care provider may recommend screening at an earlier age if you have risk factors for colon cancer.  Your health care provider may recommend using home test kits to check for hidden blood in the stool.  A small camera at the end of a tube can be used to examine your colon (sigmoidoscopy or colonoscopy). This checks for the earliest forms of colorectal cancer.  Prostate and Testicular Cancer  Depending on your age and overall health, your health care provider may do certain tests to screen for prostate and testicular cancer.  Talk to your health care provider about any symptoms or concerns you have about testicular or prostate cancer.  Skin Cancer  Check your skin from head to toe regularly.  Tell your health care provider about any new moles or changes in moles, especially if: ? There is a change in a mole's size, shape, or color. ? You have a mole that is larger than a pencil eraser.  Always use sunscreen. Apply sunscreen liberally and repeat throughout the day.  Protect yourself by wearing long sleeves, pants, a wide-brimmed hat, and sunglasses when outside.  What should I know about heart disease, diabetes, and high blood pressure?  If you are 18-39 years of age,   have your blood pressure checked every 3-5 years. If you are 40 years of age or older, have your blood pressure checked every year. You should have your blood pressure measured twice-once when you are at a hospital or clinic, and once when you are not at a hospital or clinic. Record the average of the two measurements. To check your blood pressure when you are not at a hospital or clinic, you can use: ? An automated blood pressure machine at a pharmacy. ? A home blood  pressure monitor.  Talk to your health care provider about your target blood pressure.  If you are between 45-79 years old, ask your health care provider if you should take aspirin to prevent heart disease.  Have regular diabetes screenings by checking your fasting blood sugar level. ? If you are at a normal weight and have a low risk for diabetes, have this test once every three years after the age of 45. ? If you are overweight and have a high risk for diabetes, consider being tested at a younger age or more often.  A one-time screening for abdominal aortic aneurysm (AAA) by ultrasound is recommended for men aged 65-75 years who are current or former smokers. What should I know about preventing infection? Hepatitis B If you have a higher risk for hepatitis B, you should be screened for this virus. Talk with your health care provider to find out if you are at risk for hepatitis B infection. Hepatitis C Blood testing is recommended for:  Everyone born from 1945 through 1965.  Anyone with known risk factors for hepatitis C.  Sexually Transmitted Diseases (STDs)  You should be screened each year for STDs including gonorrhea and chlamydia if: ? You are sexually active and are younger than 82 years of age. ? You are older than 82 years of age and your health care provider tells you that you are at risk for this type of infection. ? Your sexual activity has changed since you were last screened and you are at an increased risk for chlamydia or gonorrhea. Ask your health care provider if you are at risk.  Talk with your health care provider about whether you are at high risk of being infected with HIV. Your health care provider may recommend a prescription medicine to help prevent HIV infection.  What else can I do?  Schedule regular health, dental, and eye exams.  Stay current with your vaccines (immunizations).  Do not use any tobacco products, such as cigarettes, chewing tobacco, and  e-cigarettes. If you need help quitting, ask your health care provider.  Limit alcohol intake to no more than 2 drinks per day. One drink equals 12 ounces of beer, 5 ounces of wine, or 1 ounces of hard liquor.  Do not use street drugs.  Do not share needles.  Ask your health care provider for help if you need support or information about quitting drugs.  Tell your health care provider if you often feel depressed.  Tell your health care provider if you have ever been abused or do not feel safe at home. This information is not intended to replace advice given to you by your health care provider. Make sure you discuss any questions you have with your health care provider. Document Released: 01/15/2008 Document Revised: 03/17/2016 Document Reviewed: 04/22/2015 Elsevier Interactive Patient Education  2018 [employer]  

## 2017-11-30 ENCOUNTER — Other Ambulatory Visit: Payer: Self-pay

## 2017-11-30 DIAGNOSIS — I482 Chronic atrial fibrillation, unspecified: Secondary | ICD-10-CM

## 2017-11-30 DIAGNOSIS — Z7901 Long term (current) use of anticoagulants: Secondary | ICD-10-CM

## 2017-12-01 NOTE — Progress Notes (Signed)
AWV reviewed and agree. Signed:  Crissie Sickles, MD           12/01/2017

## 2017-12-23 ENCOUNTER — Telehealth: Payer: Self-pay | Admitting: Family Medicine

## 2017-12-23 ENCOUNTER — Encounter: Payer: Self-pay | Admitting: Family Medicine

## 2017-12-23 ENCOUNTER — Ambulatory Visit (INDEPENDENT_AMBULATORY_CARE_PROVIDER_SITE_OTHER): Payer: Medicare HMO | Admitting: Family Medicine

## 2017-12-23 VITALS — BP 135/71 | HR 70 | Temp 98.2°F | Resp 16 | Ht 67.5 in | Wt 166.2 lb

## 2017-12-23 DIAGNOSIS — Z0000 Encounter for general adult medical examination without abnormal findings: Secondary | ICD-10-CM

## 2017-12-23 DIAGNOSIS — E7800 Pure hypercholesterolemia, unspecified: Secondary | ICD-10-CM

## 2017-12-23 DIAGNOSIS — E663 Overweight: Secondary | ICD-10-CM

## 2017-12-23 DIAGNOSIS — S161XXA Strain of muscle, fascia and tendon at neck level, initial encounter: Secondary | ICD-10-CM | POA: Diagnosis not present

## 2017-12-23 DIAGNOSIS — Z0001 Encounter for general adult medical examination with abnormal findings: Secondary | ICD-10-CM

## 2017-12-23 DIAGNOSIS — I10 Essential (primary) hypertension: Secondary | ICD-10-CM

## 2017-12-23 DIAGNOSIS — I482 Chronic atrial fibrillation, unspecified: Secondary | ICD-10-CM

## 2017-12-23 DIAGNOSIS — R7303 Prediabetes: Secondary | ICD-10-CM

## 2017-12-23 MED ORDER — CYCLOBENZAPRINE HCL 10 MG PO TABS: ORAL_TABLET | ORAL | 1 refills | Status: DC
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
## 2017-12-23 NOTE — Telephone Encounter (Signed)
Copied from [place] [phone]. Topic: Quick Communication - See Telephone Encounter >> Dec 23, 2017 10:55 AM Synthia Innocent wrote: CRM for notification. See Telephone encounter for: 12/23/17. Patient was just seen and will be going out of town and requesting cyclobenzaprine (FLEXERIL) 10 MG tablet be sent to [place], instead of [facility]

## 2017-12-23 NOTE — Patient Instructions (Signed)
  Apply warm compress or heating pad to area of pain on L side of neck/shoulder for 20 min at least once a day. Do gentle range of motion exercises 1-2 times per day.

## 2017-12-23 NOTE — Telephone Encounter (Addendum)
Per Dr. Anitra Lauth okay to cancel Rx sent to [facility] and resend Rx to [place].  [facility] and cancelled Rx. Resent Rx to [facility]. Pt advised and voiced understanding.

## 2018-01-16 ENCOUNTER — Other Ambulatory Visit (INDEPENDENT_AMBULATORY_CARE_PROVIDER_SITE_OTHER): Payer: Medicare HMO

## 2018-01-16 DIAGNOSIS — R7303 Prediabetes: Secondary | ICD-10-CM | POA: Diagnosis not present

## 2018-01-16 DIAGNOSIS — I482 Chronic atrial fibrillation, unspecified: Secondary | ICD-10-CM

## 2018-01-16 DIAGNOSIS — I10 Essential (primary) hypertension: Secondary | ICD-10-CM

## 2018-01-16 DIAGNOSIS — Z0000 Encounter for general adult medical examination without abnormal findings: Secondary | ICD-10-CM | POA: Diagnosis not present

## 2018-01-16 DIAGNOSIS — E7800 Pure hypercholesterolemia, unspecified: Secondary | ICD-10-CM

## 2018-01-16 LAB — PROTIME-INR
INR: 2 ratio — ABNORMAL HIGH (ref 0.8–1.0)
Prothrombin Time: 23.1 s — ABNORMAL HIGH (ref 9.6–13.1)

## 2018-01-16 LAB — LIPID PANEL
CHOL/HDL RATIO: 3
CHOLESTEROL: 124 mg/dL (ref 0–200)
HDL: 45.3 mg/dL (ref >39.00)
LDL CALC: 65 mg/dL (ref 0–99)
NonHDL: 78.32
Triglycerides: 66 mg/dL (ref 0.0–149.0)
VLDL: 13.2 mg/dL (ref 0.0–40.0)

## 2018-01-16 LAB — CBC WITH DIFFERENTIAL/PLATELET
BASOS ABS: 0.1 10*3/uL (ref 0.0–0.1)
Basophils Relative: 1.3 % (ref 0.0–3.0)
EOS ABS: 0.3 10*3/uL (ref 0.0–0.7)
EOS PCT: 5.5 % — AB (ref 0.0–5.0)
HCT: 47.5 % (ref 39.0–52.0)
HEMOGLOBIN: 16.4 g/dL (ref 13.0–17.0)
LYMPHS ABS: 1 10*3/uL (ref 0.7–4.0)
Lymphocytes Relative: 18.2 % (ref 12.0–46.0)
MCHC: 34.4 g/dL (ref 30.0–36.0)
MCV: 92.5 fl (ref 78.0–100.0)
MONO ABS: 0.4 10*3/uL (ref 0.1–1.0)
Monocytes Relative: 7.3 % (ref 3.0–12.0)
Neutro Abs: 3.9 10*3/uL (ref 1.4–7.7)
Neutrophils Relative %: 67.7 % (ref 43.0–77.0)
Platelets: 133 10*3/uL — ABNORMAL LOW (ref 150.0–400.0)
RBC: 5.13 Mil/uL (ref 4.22–5.81)
RDW: 13.6 % (ref 11.5–15.5)
WBC: 5.7 10*3/uL (ref 4.0–10.5)

## 2018-01-16 LAB — COMPREHENSIVE METABOLIC PANEL
ALBUMIN: 4.2 g/dL (ref 3.5–5.2)
ALT: 22 U/L (ref 0–53)
AST: 18 U/L (ref 0–37)
Alkaline Phosphatase: 48 U/L (ref 39–117)
BUN: 16 mg/dL (ref 6–23)
CALCIUM: 9.5 mg/dL (ref 8.4–10.5)
CO2: 29 mEq/L (ref 19–32)
Chloride: 103 mEq/L (ref 96–112)
Creatinine, Ser: 0.88 mg/dL (ref 0.40–1.50)
GFR: 88.13 mL/min (ref >60.00)
Glucose, Bld: 114 mg/dL — ABNORMAL HIGH (ref 70–99)
POTASSIUM: 4.2 meq/L (ref 3.5–5.1)
SODIUM: 140 meq/L (ref 135–145)
Total Bilirubin: 1.4 mg/dL — ABNORMAL HIGH (ref 0.2–1.2)
Total Protein: 6.1 g/dL (ref 6.0–8.3)

## 2018-01-16 LAB — HEMOGLOBIN A1C: Hgb A1c MFr Bld: 5.9 % (ref 4.6–6.5)

## 2018-01-17 ENCOUNTER — Encounter: Payer: Self-pay | Admitting: Family Medicine

## 2018-02-13 ENCOUNTER — Other Ambulatory Visit: Payer: Self-pay | Admitting: Family Medicine

## 2018-02-13 DIAGNOSIS — I2510 Atherosclerotic heart disease of native coronary artery without angina pectoris: Secondary | ICD-10-CM

## 2018-02-13 DIAGNOSIS — I482 Chronic atrial fibrillation, unspecified: Secondary | ICD-10-CM

## 2018-02-13 MED ORDER — ATORVASTATIN CALCIUM 40 MG PO TABS: 40.0000 mg | ORAL_TABLET | Freq: Every day | ORAL | 0 refills | Status: DC

## 2018-02-13 MED ORDER — METOPROLOL TARTRATE 25 MG PO TABS: 25.0000 mg | ORAL_TABLET | Freq: Two times a day (BID) | ORAL | 0 refills | Status: DC

## 2018-02-13 NOTE — Telephone Encounter (Signed)
Copied from [place] [phone]. Topic: Quick Communication - Rx Refill/Question >> Feb 13, 2018  2:54 PM Keene Breath wrote: Medication: atorvastatin (LIPITOR) 40 MG tablet and metoprolol tartrate (LOPRESSOR) 25 MG tablet  Patient called to request refills for the above medications.  CB# [phone]  Preferred Pharmacy (with phone number or street name): [place], [place] [phone] (Phone) [phone] (Fax)

## 2018-02-14 ENCOUNTER — Other Ambulatory Visit (INDEPENDENT_AMBULATORY_CARE_PROVIDER_SITE_OTHER): Payer: Medicare HMO

## 2018-02-14 DIAGNOSIS — I482 Chronic atrial fibrillation, unspecified: Secondary | ICD-10-CM

## 2018-02-14 LAB — PROTIME-INR
INR: 2.2 ratio — AB (ref 0.8–1.0)
PROTHROMBIN TIME: 25.7 s — AB (ref 9.6–13.1)

## 2018-03-13 NOTE — Progress Notes (Signed)
HPI The patient presents for follow up of CAD.  He has a history of CAD s/p CABG.  He has permanent atrial fib.   Since I last saw him he has done well from a cardiac standpoint.  The patient denies any new symptoms such as chest discomfort, neck or arm discomfort. There has been no new shortness of breath, PND or orthopnea. There have been no reported palpitations, presyncope or syncope.  He does not feel his atrial fib.  He is bothered by hemorrhoids and may have to have some treatment of internal hemorrhoids.  He does yard work including raising a garden.  Allergies  Allergen Reactions  . Sulfa Antibiotics Itching    Cannot remember what kind of reaction    Current Outpatient Medications  Medication Sig Dispense Refill  . acetaminophen (TYLENOL) 500 MG tablet Take 1,000 mg by mouth daily as needed for moderate pain or headache.    [name] atorvastatin (LIPITOR) 40 MG tablet Take 1 tablet (40 mg total) by mouth daily. 90 tablet 0  . cetirizine (ZYRTEC) 10 MG tablet Take 10 mg by mouth as needed for allergies.     . cyclobenzaprine (FLEXERIL) 10 MG tablet 1/2-1 tab po tid prn neck pain 30 tablet 1  . fluticasone (FLONASE) 50 MCG/ACT nasal spray Place 2 sprays into both nostrils daily as needed for allergies. 48 g 3  . ipratropium (ATROVENT) 0.06 % nasal spray Place 2 sprays into both nostrils 3 (three) times daily. 45 mL 3  . metoprolol tartrate (LOPRESSOR) 25 MG tablet Take 1 tablet (25 mg total) by mouth 2 (two) times daily. 180 tablet 0  . Omega-3 Fatty Acids (FISH OIL PO) Take 360 mg by mouth every evening.     . triamcinolone ointment (TRIDERM) 0.1 % Apply 1 application topically 2 (two) times daily as needed (eczema).     . warfarin (COUMADIN) 5 MG tablet TAKE AS DIRECTED BY MD 118 tablet 4   No current facility-administered medications for this visit.     Past Medical History:  Diagnosis Date  . Adenomatous colon polyp   . Allergic rhinitis    primarily seasonal  . Atrial  fibrillation ([place])    peri op afib initially, then dx'd with permanent a-fib 2014 (xarelto started, switched to coumadin for cost reasons)  . CAD (coronary artery disease)   . Chronic idiopathic thrombocytopenia (HCC)    very mild  . Diverticulosis 2012 scope   pancolonic  . GERD (gastroesophageal reflux disease)   . Hearing impairment    [place]; has hearing aids  . Hypercholesteremia   . Hypertension   . Pneumonia   . Prediabetes 05/2015; 03/2017   2016 A1c 5.8%.  2018 A1c 6.0%.  12/2017 A1c 5.9%.  [name] Ulcerative esophagitis     Past Surgical History:  Procedure Laterality Date  . CARDIOVASCULAR STRESS TEST  12/2012   Nuclear stress (exercise) testing: low risk scan  . CATARACT EXTRACTION    . COLONOSCOPY N/A 07/22/2016   Procedure: COLONOSCOPY;  Surgeon: Rogene Houston, MD;  [facility];  Service: Endoscopy;  Laterality: N/A;  1:55  . COLONOSCOPY W/ POLYPECTOMY  X 4, most recent 07/22/16   2017: tubular adenoma x 2 (recall 5 yrs--? age ?)  . CORONARY ARTERY BYPASS GRAFT  2011   L - LAD, S - diag, S - PDA/LV branch 2011.  Nl EF  . INGUINAL HERNIA REPAIR  1970s   right groin  . POLYPECTOMY  07/22/2016  Procedure: POLYPECTOMY;  Surgeon: Rogene Houston, MD;  [facility];  Service: Endoscopy;;  hepatic flexure,  sigmoid colon,   . TONSILLECTOMY AND ADENOIDECTOMY  as a child  . UPPER GI ENDOSCOPY  09/03/2010   hiatus hernia, no Barrett's esoph,esoph stricture dilated, gastric ulcers (h pylori neg)    ROS:  As stated in the HPI and negative for all other systems.  PHYSICAL EXAM BP 114/70   Pulse (!) 59   Ht 5' 7.5" (1.715 m)   Wt 167 lb 3.2 oz (75.8 kg)   BMI 25.80 kg/m   GENERAL:  Well appearing NECK:  No jugular venous distention, waveform within normal limits, carotid upstroke brisk and symmetric, no bruits, no thyromegaly LUNGS:  Clear to auscultation bilaterally CHEST:  Unremarkable HEART:  PMI not displaced or sustained,S1 and S2 within normal  limits, no S3, no clicks, no rubs, no murmurs, irregular ABD:  Flat, positive bowel sounds normal in frequency in pitch, no bruits, no rebound, no guarding, no midline pulsatile mass, no hepatomegaly, no splenomegaly EXT:  2 plus pulses throughout, no edema, no cyanosis no clubbing   EKG:  Atrial fib, rate 59, poor anterior all her progression, no acute ST-T wave changes. PVCs  03/15/2018  Lab Results  Component Value Date   CHOL 124 01/16/2018   TRIG 66.0 01/16/2018   HDL 45.30 01/16/2018   LDLCALC 65 01/16/2018    ASSESSMENT AND PLAN  ATRIAL FIBRILLATION: [name] has a CHA2DS2 - VASc score of 4 .  He might need a hemorrhoidectomy.  We discussed that he could hold his warfarin for 5 days prior to the procedure without bridging.  He is a good rate control.  He does not feel this.  No change in therapy is indicated.  CAD: The patient has no new sypmtoms.  No change in therapy or further testing is indicated.  No further cardiovascular testing is indicated.  We will continue with aggressive risk reduction and meds  HTN:  The blood pressure is at target.  No change in therapy.   HYPERLIPIDEMIA:   LDL is as above.  He will continue with meds as listed.   Labs reviewed

## 2018-03-15 ENCOUNTER — Encounter: Payer: Self-pay | Admitting: Cardiology

## 2018-03-15 ENCOUNTER — Ambulatory Visit: Payer: Medicare HMO | Admitting: Cardiology

## 2018-03-15 VITALS — BP 114/70 | HR 59 | Ht 67.5 in | Wt 167.2 lb

## 2018-03-15 DIAGNOSIS — I2510 Atherosclerotic heart disease of native coronary artery without angina pectoris: Secondary | ICD-10-CM

## 2018-03-15 DIAGNOSIS — I482 Chronic atrial fibrillation: Secondary | ICD-10-CM

## 2018-03-15 DIAGNOSIS — I10 Essential (primary) hypertension: Secondary | ICD-10-CM

## 2018-03-15 DIAGNOSIS — E785 Hyperlipidemia, unspecified: Secondary | ICD-10-CM

## 2018-03-15 DIAGNOSIS — I4821 Permanent atrial fibrillation: Secondary | ICD-10-CM

## 2018-03-15 NOTE — Patient Instructions (Signed)
Medication Instructions:  Continue current medications  If you need a refill on your cardiac medications before your next appointment, please call your pharmacy.  Labwork: None Ordered   Testing/Procedures: None ordered   Follow-Up: Your physician wants you to follow-up in: 1 Year. You should receive a reminder letter in the mail two months in advance. If you do not receive a letter, please call our office to schedule your follow-up appointment.     Thank you for choosing [facility]!!

## 2018-03-16 ENCOUNTER — Encounter: Payer: Self-pay | Admitting: Family Medicine

## 2018-03-22 ENCOUNTER — Encounter (INDEPENDENT_AMBULATORY_CARE_PROVIDER_SITE_OTHER): Payer: Self-pay | Admitting: Internal Medicine

## 2018-03-22 ENCOUNTER — Ambulatory Visit (INDEPENDENT_AMBULATORY_CARE_PROVIDER_SITE_OTHER): Payer: Medicare HMO | Admitting: Internal Medicine

## 2018-03-22 VITALS — BP 122/60 | HR 60 | Temp 97.6°F | Ht 69.0 in | Wt 165.1 lb

## 2018-03-22 DIAGNOSIS — K640 First degree hemorrhoids: Secondary | ICD-10-CM

## 2018-03-22 NOTE — Progress Notes (Signed)
Subjective:    Patient ID: Angel Exon Sr., male    DOB: October 15, 1933, 84 y.o.   MRN: [id number]  HPI Referred by Dr. Anitra Lauth for rectal bleeding. He tells me he is not having rectal bleeding. He says his hemorrhoids are large and are affecting his BM.  States a few weeks back, he has noticed he had a large hemorrhoid. He cannot reduce it.  There is no bleeding. Having a BM x 2 a day which is not a lot. Has been taking a stool softener to help with his BMs.  His appetite is good. No weight loss.    Hx of atrial fib and maintained on Coumadin.   07/22/2016 Colonoscopy: Personal hx of colon polyps.  One polyp at hepatic flexure.(Clip). Two small polyps sigmoid (biopsied) One polyp in sigmoid colon removed with cold snare. Biopsy: Tubular adenomas. Next colonoscopy 3 yrs.     CBC    Component Value Date/Time   WBC 5.7 01/16/2018 0840   RBC 5.13 01/16/2018 0840   HGB 16.4 01/16/2018 0840   HCT 47.5 01/16/2018 0840   PLT 133.0 (L) 01/16/2018 0840   MCV 92.5 01/16/2018 0840   MCHC 34.4 01/16/2018 0840   RDW 13.6 01/16/2018 0840   LYMPHSABS 1.0 01/16/2018 0840   MONOABS 0.4 01/16/2018 0840   EOSABS 0.3 01/16/2018 0840   BASOSABS 0.1 01/16/2018 0840        Review of Systems    Past Medical History:  Diagnosis Date  . Adenomatous colon polyp   . Allergic rhinitis    primarily seasonal  . Atrial fibrillation ([place])    peri op afib initially, then dx'd with permanent a-fib 2014 (xarelto started, switched to coumadin for cost reasons)  . CAD (coronary artery disease)    CABG 2011  . Chronic idiopathic thrombocytopenia (HCC)    very mild  . Diverticulosis 2012 scope   pancolonic  . GERD (gastroesophageal reflux disease)   . Hearing impairment    [place]; has hearing aids  . Hypercholesteremia   . Hypertension   . Pneumonia   . Prediabetes 05/2015; 03/2017   2016 A1c 5.8%.  2018 A1c 6.0%.  12/2017 A1c 5.9%.  [name] Ulcerative esophagitis     Past Surgical History:    Procedure Laterality Date  . CARDIOVASCULAR STRESS TEST  12/2012   Nuclear stress (exercise) testing: low risk scan  . CATARACT EXTRACTION    . COLONOSCOPY N/A 07/22/2016   Procedure: COLONOSCOPY;  Surgeon: Rogene Houston, MD;  [facility];  Service: Endoscopy;  Laterality: N/A;  1:55  . COLONOSCOPY W/ POLYPECTOMY  X 4, most recent 07/22/16   2017: tubular adenoma x 2 (recall 5 yrs--? age ?)  . CORONARY ARTERY BYPASS GRAFT  2011   L - LAD, S - diag, S - PDA/LV branch 2011.  Nl EF  . INGUINAL HERNIA REPAIR  1970s   right groin  . POLYPECTOMY  07/22/2016   Procedure: POLYPECTOMY;  Surgeon: Rogene Houston, MD;  [facility];  Service: Endoscopy;;  hepatic flexure,  sigmoid colon,   . TONSILLECTOMY AND ADENOIDECTOMY  as a child  . UPPER GI ENDOSCOPY  09/03/2010   hiatus hernia, no Barrett's esoph,esoph stricture dilated, gastric ulcers (h pylori neg)    Allergies  Allergen Reactions  . Sulfa Antibiotics Itching    Cannot remember what kind of reaction    Current Outpatient Medications on File Prior to Visit  Medication Sig Dispense Refill  . acetaminophen (  TYLENOL) 500 MG tablet Take 1,000 mg by mouth daily as needed for moderate pain or headache.    [name] atorvastatin (LIPITOR) 40 MG tablet Take 1 tablet (40 mg total) by mouth daily. 90 tablet 0  . cetirizine (ZYRTEC) 10 MG tablet Take 10 mg by mouth as needed for allergies.     [name] ipratropium (ATROVENT) 0.06 % nasal spray Place 2 sprays into both nostrils 3 (three) times daily. 45 mL 3  . Omega-3 Fatty Acids (FISH OIL PO) Take 360 mg by mouth every evening.     . triamcinolone ointment (TRIDERM) 0.1 % Apply 1 application topically 2 (two) times daily as needed (eczema).     . warfarin (COUMADIN) 5 MG tablet TAKE AS DIRECTED BY MD (Patient taking differently: Takes 5mg  on Sun, Tue, Thu, Frida and Sat. 7.5mg  on Mon and QWed.) 118 tablet 4   No current facility-administered medications on file prior to visit.            Objective:   Physical Exam Blood pressure 122/60, pulse 60, temperature 97.6 F (36.4 C), height 5\' 9"  (1.753 m), weight 165 lb 1.6 oz (74.9 kg). Alert and oriented. Skin warm and dry. Oral mucosa is moist.   . Sclera anicteric, conjunctivae is pink. Thyroid not enlarged. No cervical lymphadenopathy. Lungs clear. Heart regular rate and rhythm.  Abdomen is soft. Bowel sounds are positive. No hepatomegaly. No abdominal masses felt. No tenderness.  No edema to lower extremities.  Rectal: no masses. Guaiac negative. Small hemorrhoid noted.          Assessment & Plan:  Hemorrhoids. Non Bleeding. Easily reducible. Will refer to Dr. Arnoldo Morale at patient's request.

## 2018-03-22 NOTE — Patient Instructions (Addendum)
Will do referral to Dr. Arnoldo Morale.

## 2018-03-28 ENCOUNTER — Other Ambulatory Visit (INDEPENDENT_AMBULATORY_CARE_PROVIDER_SITE_OTHER): Payer: Medicare HMO

## 2018-03-28 DIAGNOSIS — I482 Chronic atrial fibrillation, unspecified: Secondary | ICD-10-CM

## 2018-03-28 LAB — PROTIME-INR
INR: 2 ratio — ABNORMAL HIGH (ref 0.8–1.0)
PROTHROMBIN TIME: 23.5 s — AB (ref 9.6–13.1)

## 2018-04-04 ENCOUNTER — Encounter: Payer: Self-pay | Admitting: Family Medicine

## 2018-04-04 ENCOUNTER — Ambulatory Visit (INDEPENDENT_AMBULATORY_CARE_PROVIDER_SITE_OTHER): Payer: Medicare HMO | Admitting: Family Medicine

## 2018-04-04 VITALS — BP 129/72 | HR 90 | Temp 98.3°F | Resp 20 | Ht 69.0 in | Wt 165.2 lb

## 2018-04-04 DIAGNOSIS — A084 Viral intestinal infection, unspecified: Secondary | ICD-10-CM

## 2018-04-04 MED ORDER — ONDANSETRON HCL 4 MG PO TABS: 4.0000 mg | ORAL_TABLET | Freq: Three times a day (TID) | ORAL | 0 refills | Status: DC | PRN

## 2018-04-04 NOTE — Progress Notes (Signed)
Angel SANZO Sr. , 1934/07/20, 84 y.o., male MRN: [id number] Patient Care Team    Relationship Specialty Notifications Start End  McGowen, Adrian Blackwater, MD PCP - General Family Medicine  03/05/13    Comment: Beryle Quant, MD Consulting Physician Cardiology  09/25/15   Katy Apo, MD Consulting Physician Ophthalmology  09/25/15   Butch Penny, NP Nurse Practitioner Gastroenterology  05/17/16    Comment: Claudell Kyle, MD Consulting Physician Gastroenterology  05/17/16     Chief Complaint  Patient presents with  . URI    congested ,drainage x 2 days  . Diarrhea     Subjective: Pt presents for an OV with complaints of diarrhea of 4 days duration.  Associated symptoms include x7 watery diarrhea/d, non-productive cough, nasal congestion and nausea. He denies fever, chills, vomit or abd pain. Pt denies exposure  to insects, recent travel, under prepared foods, antibiotics. His wife has same symptoms that started 3 days prior to his onset of symptoms.  Pt has tried flonase and zyrtec  to ease their symptoms.   Depression screen [facility] 2/9 11/29/2017 11/16/2016 09/25/2015 07/14/2015 06/05/2015  Decreased Interest 0 0 0 0 0  Down, Depressed, Hopeless 0 0 0 0 0  PHQ - 2 Score 0 0 0 0 0    Allergies  Allergen Reactions  . Sulfa Antibiotics Itching    Cannot remember what kind of reaction   Social History   Tobacco Use  . Smoking status: Never Smoker  . Smokeless tobacco: Never Used  Substance Use Topics  . Alcohol use: No    Alcohol/week: 0.0 standard drinks   Past Medical History:  Diagnosis Date  . Adenomatous colon polyp   . Allergic rhinitis    primarily seasonal  . Atrial fibrillation ([place])    peri op afib initially, then dx'd with permanent a-fib 2014 (xarelto started, switched to coumadin for cost reasons)  . CAD (coronary artery disease)    CABG 2011  . Chronic idiopathic thrombocytopenia (HCC)    very mild  . Diverticulosis 2012 scope   pancolonic  . GERD (gastroesophageal reflux disease)   . Hearing impairment    AU; has hearing aids  . Hemorrhoids    GI referred pt to Dr. Arnoldo Morale 03/2018  . Hypercholesteremia   . Hypertension   . Pneumonia   . Prediabetes 05/2015; 03/2017   2016 A1c 5.8%.  2018 A1c 6.0%.  12/2017 A1c 5.9%.  [name] Ulcerative esophagitis    Past Surgical History:  Procedure Laterality Date  . CARDIOVASCULAR STRESS TEST  12/2012   Nuclear stress (exercise) testing: low risk scan  . CATARACT EXTRACTION    . COLONOSCOPY N/A 07/22/2016   Procedure: COLONOSCOPY;  Surgeon: Rogene Houston, MD;  [facility];  Service: Endoscopy;  Laterality: N/A;  1:55  . COLONOSCOPY W/ POLYPECTOMY  X 4, most recent 07/22/16   2017: tubular adenoma x 2 (recall 5 yrs--? age ?)  . CORONARY ARTERY BYPASS GRAFT  2011   L - LAD, S - diag, S - PDA/LV branch 2011.  Nl EF  . INGUINAL HERNIA REPAIR  1970s   right groin  . POLYPECTOMY  07/22/2016   Procedure: POLYPECTOMY;  Surgeon: Rogene Houston, MD;  [facility];  Service: Endoscopy;;  hepatic flexure,  sigmoid colon,   . TONSILLECTOMY AND ADENOIDECTOMY  as a child  . UPPER GI ENDOSCOPY  09/03/2010   hiatus hernia, no Barrett's esoph,esoph stricture dilated,  gastric ulcers (h pylori neg)   Family History  Problem Relation Age of Onset  . Stroke Mother   . Pneumonia Father   . Diabetes Father   . Coronary artery disease Brother 50       stent  . Diabetes Brother    Allergies as of 04/04/2018      Reactions   Sulfa Antibiotics Itching   Cannot remember what kind of reaction      Medication List        Accurate as of 04/04/18 10:34 AM. Always use your most recent med list.          acetaminophen 500 MG tablet Commonly known as:  TYLENOL Take 1,000 mg by mouth daily as needed for moderate pain or headache.   atorvastatin 40 MG tablet Commonly known as:  LIPITOR Take 1 tablet (40 mg total) by mouth daily.   cetirizine 10 MG tablet Commonly  known as:  ZYRTEC Take 10 mg by mouth as needed for allergies.   FISH OIL PO Take 360 mg by mouth every evening.   fluticasone 50 MCG/ACT nasal spray Commonly known as:  FLONASE Place 2 sprays into both nostrils daily.   ipratropium 0.06 % nasal spray Commonly known as:  ATROVENT Place 2 sprays into both nostrils 3 (three) times daily.   TRIDERM 0.1 % Generic drug:  triamcinolone ointment Apply 1 application topically 2 (two) times daily as needed (eczema).   warfarin 5 MG tablet Commonly known as:  COUMADIN TAKE AS DIRECTED BY MD       All past medical history, surgical history, allergies, family history, immunizations andmedications were updated in the EMR today and reviewed under the history and medication portions of their EMR.     ROS: Negative, with the exception of above mentioned in HPI   Objective:  BP 129/72 (BP Location: Left Arm, Patient Position: Sitting, Cuff Size: Normal)   Pulse 90   Temp 98.3 F (36.8 C)   Resp 20   Ht 5\' 9"  (1.753 m)   Wt 165 lb 4 oz (75 kg)   SpO2 96%   BMI 24.40 kg/m  Body mass index is 24.4 kg/m. Gen: Afebrile. No acute distress. Nontoxic in appearance, well developed, well nourished.  HENT: AT. [place]. Bilateral TM visualized without erythema or fullness. Mildly dry mucous membranes, no oral lesions. Bilateral nares without erythema, swelling or drainage. Throat without erythema or exudates. Mild cough. Mild hoarseness.  Eyes:Pupils Equal Round Reactive to light, Extraocular movements intact,  Conjunctiva without redness, discharge or icterus. Neck/lymp/endocrine: Supple,no lymphadenopathy CV: irregularly irregular. No edema.  Chest: CTAB, no wheeze or crackles. Good air movement, normal resp effort.  Abd: Soft. NTND. BS hyperactive. Skin: no rashes, purpura or petechiae.  Neuro:  Normal gait. PERLA. EOMi. Alert. Oriented x3  No exam data present No results found. No results found for this or any previous visit (from the past 24  hour(s)).  Assessment/Plan: Angel NIAZI Sr. is a 84 y.o. male present for OV for  Viral gastroenteritis/syndrome Suspect viral cause. Wife has same symptoms that started a few days prior to his symptoms.  Zofran scheduled for 48 hours. Hydrate-water and G2, liquid diet for 24 hours then advance to bland soft. No dairy.  - avoid antidiarrheal meds if able.  - F/U 1 week if not resolved, sooner if worsening.    Reviewed expectations re: course of current medical issues.  Discussed self-management of symptoms.  Outlined signs and symptoms indicating need for  more acute intervention.  Patient verbalized understanding and all questions were answered.  Patient received an After-Visit Summary.    No orders of the defined types were placed in this encounter.    Note is dictated utilizing voice recognition software. Although note has been proof read prior to signing, occasional typographical errors still can be missed. If any questions arise, please do not hesitate to call for verification.   electronically signed by:  Howard Pouch, DO  [place]

## 2018-04-04 NOTE — Patient Instructions (Addendum)
Zofran every 8 hours next 48 hours. In this time you are to increase hydration to at least 80 ounces of water and add G2 (gatroade with less sugar). Avoid dairy products.  Eat a bland diet for next 24 hours, then slowly progress to solid bland. This virus can affect your sinus, upper respiratory and cause diarrhea.  Avoid antidiarrheal medications.    Viral Gastroenteritis, Adult Viral gastroenteritis is also known as the stomach flu. This condition is caused by certain germs (viruses). These germs can be passed from person to person very easily (are very contagious). This condition can cause sudden watery poop (diarrhea), fever, and throwing up (vomiting). Having watery poop and throwing up can make you feel weak and cause you to get dehydrated. Dehydration can make you tired and thirsty, make you have a dry mouth, and make it so you pee (urinate) less often. Older adults and people with other diseases or a weak defense system (immune system) are at higher risk for dehydration. It is important to replace the fluids that you lose from having watery poop and throwing up. Follow these instructions at home: Follow instructions from your doctor about how to care for yourself at home. Eating and drinking  Follow these instructions as told by your doctor:  Take an oral rehydration solution (ORS). This is a drink that is sold at pharmacies and stores.  Drink clear fluids in small amounts as you are able, such as: ? Water. ? Ice chips. ? Diluted fruit juice. ? Low-calorie sports drinks.  Eat bland, easy-to-digest foods in small amounts as you are able, such as: ? Bananas. ? Applesauce. ? Rice. ? Low-fat (lean) meats. ? Toast. ? Crackers.  Avoid fluids that have a lot of sugar or caffeine in them.  Avoid alcohol.  Avoid spicy or fatty foods.  General instructions  Drink enough fluid to keep your pee (urine) clear or pale yellow.  Wash your hands often. If you cannot use soap and  water, use hand sanitizer.  Make sure that all people in your home wash their hands well and often.  Rest at home while you get better.  Take over-the-counter and prescription medicines only as told by your doctor.  Watch your condition for any changes.  Take a warm bath to help with any burning or pain from having watery poop.  Keep all follow-up visits as told by your doctor. This is important. Contact a doctor if:  You cannot keep fluids down.  Your symptoms get worse.  You have new symptoms.  You feel light-headed or dizzy.  You have muscle cramps. Get help right away if:  You have chest pain.  You feel very weak or you pass out (faint).  You see blood in your throw-up.  Your throw-up looks like coffee grounds.  You have bloody or black poop (stools) or poop that look like tar.  You have a very bad headache, a stiff neck, or both.  You have a rash.  You have very bad pain, cramping, or bloating in your belly (abdomen).  You have trouble breathing.  You are breathing very quickly.  Your heart is beating very quickly.  Your skin feels cold and clammy.  You feel confused.  You have pain when you pee.  You have signs of dehydration, such as: ? Dark pee, hardly any pee, or no pee. ? Cracked lips. ? Dry mouth. ? Sunken eyes. ? Sleepiness. ? Weakness. This information is not intended to replace advice given to  you by your health care provider. Make sure you discuss any questions you have with your health care provider. Document Released: 01/05/2008 Document Revised: 02/06/2016 Document Reviewed: 03/25/2015 Elsevier Interactive Patient Education  2017 [employer].

## 2018-04-07 ENCOUNTER — Encounter: Payer: Self-pay | Admitting: Family Medicine

## 2018-04-07 ENCOUNTER — Ambulatory Visit (INDEPENDENT_AMBULATORY_CARE_PROVIDER_SITE_OTHER): Payer: Medicare HMO | Admitting: Family Medicine

## 2018-04-07 VITALS — BP 126/70 | HR 79 | Temp 98.0°F | Resp 16 | Ht 69.0 in | Wt 160.2 lb

## 2018-04-07 DIAGNOSIS — E860 Dehydration: Secondary | ICD-10-CM

## 2018-04-07 DIAGNOSIS — Z23 Encounter for immunization: Secondary | ICD-10-CM

## 2018-04-07 DIAGNOSIS — K529 Noninfective gastroenteritis and colitis, unspecified: Secondary | ICD-10-CM

## 2018-04-07 NOTE — Progress Notes (Signed)
OFFICE VISIT  04/07/2018   CC:  Chief Complaint  Patient presents with  . Diarrhea   HPI:    Patient is a 84 y.o. Caucasian male who presents for diarrhea. Of note, he was seen here 3 d/a and dx'd with viral GE, zofran rx'd, otc anti diarrhea med recommended if pt able. No diarrhea med taken.  About 1 wk duration of illness now--nausea w/out vomiting, frequent watery BMs (3-4 per day).  No fevers. No abd pain.  Zofran has helped his nausea.  Last night tried some chicken salad and apple sauce. This morning--dry toast. At lunch time today-- chicken soup.  All of this food ingestion stayed down and did not provoke a watery BM. Right before coming to office today he had one small partially formed bm, the first one since onset of illness. Has been drinking gatorade and water and his urine output has picked up and urine color is light yellow. No orthostatic dizziness. He had ST and nasal congestion with onset of this illness.  Has some PND.  Respiratory sx's seem to be stable/plateaued.  No cough or wheeze or SOB.  Wife with similar illness and her sx's are getting better.   Past Medical History:  Diagnosis Date  . Adenomatous colon polyp   . Allergic rhinitis    primarily seasonal  . Atrial fibrillation ([place])    peri op afib initially, then dx'd with permanent a-fib 2014 (xarelto started, switched to coumadin for cost reasons)  . CAD (coronary artery disease)    CABG 2011  . Chronic idiopathic thrombocytopenia ([facility])    very mild  . Diverticulosis 2012 scope   pancolonic  . GERD (gastroesophageal reflux disease)   . Hearing impairment    AU; has hearing aids  . Hemorrhoids    GI referred pt to Dr. Arnoldo Morale 03/2018  . Hypercholesteremia   . Hypertension   . Pneumonia   . Prediabetes 05/2015; 03/2017   2016 A1c 5.8%.  2018 A1c 6.0%.  12/2017 A1c 5.9%.  [name] Ulcerative esophagitis     Past Surgical History:  Procedure Laterality Date  . CARDIOVASCULAR STRESS TEST  12/2012   Nuclear stress (exercise) testing: low risk scan  . CATARACT EXTRACTION    . COLONOSCOPY N/A 07/22/2016   Procedure: COLONOSCOPY;  Surgeon: Rogene Houston, MD;  [facility];  Service: Endoscopy;  Laterality: N/A;  1:55  . COLONOSCOPY W/ POLYPECTOMY  X 4, most recent 07/22/16   2017: tubular adenoma x 2 (recall 5 yrs--? age ?)  . CORONARY ARTERY BYPASS GRAFT  2011   L - LAD, S - diag, S - PDA/LV branch 2011.  Nl EF  . INGUINAL HERNIA REPAIR  1970s   right groin  . POLYPECTOMY  07/22/2016   Procedure: POLYPECTOMY;  Surgeon: Rogene Houston, MD;  [facility];  Service: Endoscopy;;  hepatic flexure,  sigmoid colon,   . TONSILLECTOMY AND ADENOIDECTOMY  as a child  . UPPER GI ENDOSCOPY  09/03/2010   hiatus hernia, no Barrett's esoph,esoph stricture dilated, gastric ulcers (h pylori neg)    Outpatient Medications Prior to Visit  Medication Sig Dispense Refill  . acetaminophen (TYLENOL) 500 MG tablet Take 1,000 mg by mouth daily as needed for moderate pain or headache.    [name] atorvastatin (LIPITOR) 40 MG tablet Take 1 tablet (40 mg total) by mouth daily. 90 tablet 0  . cetirizine (ZYRTEC) 10 MG tablet Take 10 mg by mouth as needed for allergies.     [name]  fluticasone (FLONASE) 50 MCG/ACT nasal spray Place 2 sprays into both nostrils daily.    [name] ipratropium (ATROVENT) 0.06 % nasal spray Place 2 sprays into both nostrils 3 (three) times daily. 45 mL 3  . Omega-3 Fatty Acids (FISH OIL PO) Take 360 mg by mouth every evening.     . ondansetron (ZOFRAN) 4 MG tablet Take 1 tablet (4 mg total) by mouth every 8 (eight) hours as needed for nausea or vomiting. 20 tablet 0  . triamcinolone ointment (TRIDERM) 0.1 % Apply 1 application topically 2 (two) times daily as needed (eczema).     . warfarin (COUMADIN) 5 MG tablet TAKE AS DIRECTED BY MD (Patient taking differently: Takes 5mg  on Sun, Tue, Thu, Frida and Sat. 7.5mg  on Mon and QWed.) 118 tablet 4   No facility-administered medications  prior to visit.     Allergies  Allergen Reactions  . Sulfa Antibiotics Itching    Cannot remember what kind of reaction    ROS As per HPI  PE: Blood pressure 126/70, pulse 79, temperature 98 F (36.7 C), temperature source Oral, resp. rate 16, height 5\' 9"  (1.753 m), weight 160 lb 4 oz (72.7 kg), SpO2 96 %.  Wt is 5 lb down from wt 3 days ago. Gen: Alert, well appearing.  Patient is oriented to person, place, time, and situation. AFFECT: pleasant, lucid thought and speech. NTI:RWER: no injection, icteris, swelling, or exudate.  EOMI, PERRLA. Mouth: lips without lesion/swelling.  Oral mucosa pink and moist. Oropharynx without erythema, exudate, or swelling.  Neck - No masses or thyromegaly or limitation in range of motion CV: Irreg irreg, no m/r.  LUNGS: CTA bilat, nonlabored resps, good aeration in all lung fields. ABD: soft, NT, ND, BS normal.  No hepatospenomegaly or mass.  No bruits. EXT: no clubbing or cyanosis.  no edema.    LABS:  None today    Chemistry      Component Value Date/Time   NA 140 01/16/2018 0840   K 4.2 01/16/2018 0840   CL 103 01/16/2018 0840   CO2 29 01/16/2018 0840   BUN 16 01/16/2018 0840   CREATININE 0.88 01/16/2018 0840      Component Value Date/Time   CALCIUM 9.5 01/16/2018 0840   ALKPHOS 48 01/16/2018 0840   AST 18 01/16/2018 0840   ALT 22 01/16/2018 0840   BILITOT 1.4 (H) 01/16/2018 0840     Lab Results  Component Value Date   INR 2.0 (H) 03/28/2018   INR 2.2 (H) 02/14/2018   INR 2.0 (H) 01/16/2018    IMPRESSION AND PLAN:  Viral GE: he seems to be turning the corner the last 24 hours or so. His wt is down, indicating dehydration, but he has no orthostatic dizziness, no tachycardia, and no hypotension. Additionally, he is able to drink water + electrolyte fluids w/out problem and his urine output is improving. Continue zofran.  Discussed trial of imodium 1-2 times per day if diarrhea persists over the next couple days. Go to ED  if watery BMs persist AND if unable to replace fluids by mouth. Signs/symptoms to call or return for were reviewed and pt expressed understanding.  An After Visit Summary was printed and given to the patient.  FOLLOW UP: Return if symptoms worsen or fail to improve.  Signed:  Crissie Sickles, MD           04/07/2018

## 2018-04-11 ENCOUNTER — Ambulatory Visit: Payer: Self-pay | Admitting: General Surgery

## 2018-04-28 ENCOUNTER — Other Ambulatory Visit: Payer: Self-pay | Admitting: Family Medicine

## 2018-04-28 DIAGNOSIS — I482 Chronic atrial fibrillation, unspecified: Secondary | ICD-10-CM

## 2018-04-28 DIAGNOSIS — I2510 Atherosclerotic heart disease of native coronary artery without angina pectoris: Secondary | ICD-10-CM

## 2018-04-28 MED ORDER — ATORVASTATIN CALCIUM 40 MG PO TABS: 40.0000 mg | ORAL_TABLET | Freq: Every day | ORAL | 0 refills | Status: DC

## 2018-04-28 NOTE — Telephone Encounter (Signed)
Copied from [place] [id number]. Topic: Quick Communication - See Telephone Encounter >> Apr 28, 2018 12:00 PM Conception Chancy, NT wrote: CRM for notification. See Telephone encounter for: 04/28/18.  Patient is calling and requesting refills on the following medications.  warfarin (COUMADIN) 5 MG tablet metoprolol tartrate (LOPRESSOR) 25 MG tablet atorvastatin (LIPITOR) 40 MG tablet  [facility] - [place], [place] [address] Phone: [phone] Fax: [phone]

## 2018-04-28 NOTE — Telephone Encounter (Signed)
Patient called and he says he still takes Metoprolol and doesn't understand why it is not listed on his profile. I advised the requests will be sent to Dr. Anitra Lauth for approval of refill, he verbalized understanding. Advised he is due for a f/u visit in November per last OV note, appointment scheduled for Monday, 06/26/18 at 0900 with Dr. Anitra Lauth.  Metoprolol refill Last refill:02/13/18 #180/0 refill  Warfarin refill Last refill:04/13/17 #118/4 refills (expired) Last OV:12/23/17 QKS:KSHNGIT Pharmacy: [facility] - [place], [place] [phone] (Phone) [phone] (Fax)

## 2018-04-28 NOTE — Telephone Encounter (Signed)
Please advise. Thanks.  

## 2018-05-01 MED ORDER — METOPROLOL TARTRATE 25 MG PO TABS: 25.0000 mg | ORAL_TABLET | Freq: Two times a day (BID) | ORAL | 3 refills | Status: DC

## 2018-05-01 MED ORDER — WARFARIN SODIUM 5 MG PO TABS: ORAL_TABLET | ORAL | 3 refills | Status: DC

## 2018-05-02 ENCOUNTER — Other Ambulatory Visit (INDEPENDENT_AMBULATORY_CARE_PROVIDER_SITE_OTHER): Payer: Medicare HMO

## 2018-05-02 DIAGNOSIS — I482 Chronic atrial fibrillation, unspecified: Secondary | ICD-10-CM

## 2018-05-02 LAB — PROTIME-INR
INR: 2.1 ratio — ABNORMAL HIGH (ref 0.8–1.0)
Prothrombin Time: 24.2 s — ABNORMAL HIGH (ref 9.6–13.1)

## 2018-05-16 ENCOUNTER — Telehealth: Payer: Self-pay | Admitting: Family Medicine

## 2018-05-16 MED ORDER — METAXALONE 400 MG PO TABS: ORAL_TABLET | ORAL | 1 refills | Status: DC

## 2018-05-16 NOTE — Telephone Encounter (Signed)
Patient's wife advised, okay per DPR.

## 2018-05-16 NOTE — Telephone Encounter (Signed)
Copied from [place] [id number]. Topic: General - Other >> May 16, 2018 11:31 AM Keene Breath wrote: Reason for CRM: Patient called to inform doctor that the medication, Cyclobenzatrine, is making him sleepy and he stopped taking it.  He would like to possibly get a prescription for something else that does not make him sleep all the time.  Patient would prefer his medication be sent to [place], [place] [phone] (Phone) [phone] (Fax), because he has a lot of problems with [facility].  Please advise.  CB# [phone].

## 2018-05-16 NOTE — Telephone Encounter (Signed)
OK. Metaxalone eRx'd to [facility].

## 2018-05-22 ENCOUNTER — Other Ambulatory Visit: Payer: Self-pay | Admitting: Family Medicine

## 2018-05-25 NOTE — Telephone Encounter (Signed)
Patient is calling regarding the new medicine that Dr. Anitra Lauth called in to the pharmacy to replace the Cyclobenztrine, that was making the patient sleepy.  The patient does not know the name of the med. However, the patient is stating that the [facility] is not approving the medicine. [employer] is recomending 2 other meds. But the patient does not know the name of the medication Please advise.  CB [phone]

## 2018-05-26 NOTE — Telephone Encounter (Signed)
SW pts wife and advised her that pt will need to contact [facility] and get the names of the two medications that they will cover/perfer. She voiced understanding and will have pt call [employer].

## 2018-05-26 NOTE — Telephone Encounter (Signed)
We need to try to do an over-ride/prior auth for the skelaxin b/c both the baclofen and the tizanadine are contraindicated for him---they will cause excessive sedation and impairment.-thx

## 2018-05-26 NOTE — Telephone Encounter (Signed)
Patient calling and states that [facility] gave him the names of the 2 prescriptions and they are as follows:  Baclofen 10-20 mg Tizanidine 2-4 mg

## 2018-05-29 NOTE — Telephone Encounter (Signed)
[employer] calling back to follow up on the request for this med to be changed.  Please advise if over ride approved thanks.

## 2018-06-02 NOTE — Telephone Encounter (Signed)
Not able to get insurance to accept id # , called patient to obtain number and will resubmit.

## 2018-06-07 ENCOUNTER — Other Ambulatory Visit (INDEPENDENT_AMBULATORY_CARE_PROVIDER_SITE_OTHER): Payer: Medicare HMO

## 2018-06-07 DIAGNOSIS — I482 Chronic atrial fibrillation, unspecified: Secondary | ICD-10-CM

## 2018-06-07 LAB — PROTIME-INR
INR: 2.1 ratio — ABNORMAL HIGH (ref 0.8–1.0)
Prothrombin Time: 24 s — ABNORMAL HIGH (ref 9.6–13.1)

## 2018-06-12 ENCOUNTER — Encounter: Payer: Self-pay | Admitting: Family Medicine

## 2018-06-13 NOTE — Telephone Encounter (Signed)
Prior approval approved, patient notified. Approval sent to pharmacy.

## 2018-06-26 ENCOUNTER — Ambulatory Visit: Payer: Medicare HMO | Admitting: Family Medicine

## 2018-07-03 ENCOUNTER — Other Ambulatory Visit: Payer: Self-pay | Admitting: Family Medicine

## 2018-07-03 DIAGNOSIS — I2510 Atherosclerotic heart disease of native coronary artery without angina pectoris: Secondary | ICD-10-CM

## 2018-07-03 DIAGNOSIS — I482 Chronic atrial fibrillation, unspecified: Secondary | ICD-10-CM

## 2018-07-06 ENCOUNTER — Encounter: Payer: Self-pay | Admitting: Family Medicine

## 2018-07-06 ENCOUNTER — Ambulatory Visit (INDEPENDENT_AMBULATORY_CARE_PROVIDER_SITE_OTHER): Payer: Medicare HMO | Admitting: Family Medicine

## 2018-07-06 VITALS — BP 136/67 | HR 67 | Temp 98.2°F | Resp 16 | Ht 69.0 in | Wt 170.2 lb

## 2018-07-06 DIAGNOSIS — R7303 Prediabetes: Secondary | ICD-10-CM | POA: Diagnosis not present

## 2018-07-06 DIAGNOSIS — S161XXD Strain of muscle, fascia and tendon at neck level, subsequent encounter: Secondary | ICD-10-CM

## 2018-07-06 DIAGNOSIS — M542 Cervicalgia: Secondary | ICD-10-CM

## 2018-07-06 DIAGNOSIS — M47812 Spondylosis without myelopathy or radiculopathy, cervical region: Secondary | ICD-10-CM

## 2018-07-06 DIAGNOSIS — I480 Paroxysmal atrial fibrillation: Secondary | ICD-10-CM

## 2018-07-06 DIAGNOSIS — I4891 Unspecified atrial fibrillation: Secondary | ICD-10-CM

## 2018-07-06 DIAGNOSIS — E663 Overweight: Secondary | ICD-10-CM

## 2018-07-06 LAB — HEMOGLOBIN A1C: Hgb A1c MFr Bld: 5.9 % (ref 4.6–6.5)

## 2018-07-06 LAB — PROTIME-INR
INR: 2.4 ratio — ABNORMAL HIGH (ref 0.8–1.0)
PROTHROMBIN TIME: 27.5 s — AB (ref 9.6–13.1)

## 2018-07-06 MED ORDER — CYCLOBENZAPRINE HCL 10 MG PO TABS: ORAL_TABLET | ORAL | 1 refills | Status: DC
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
## 2018-07-07 ENCOUNTER — Ambulatory Visit (INDEPENDENT_AMBULATORY_CARE_PROVIDER_SITE_OTHER)
Admission: RE | Admit: 2018-07-07 | Discharge: 2018-07-07 | Disposition: A | Discharge status: Home / Self Care | Payer: Medicare HMO | Source: Ambulatory Visit | Attending: Family Medicine | Admitting: Family Medicine

## 2018-07-07 ENCOUNTER — Encounter: Payer: Self-pay | Admitting: *Deleted

## 2018-07-07 DIAGNOSIS — M542 Cervicalgia: Secondary | ICD-10-CM | POA: Diagnosis not present

## 2018-07-07 DIAGNOSIS — S161XXD Strain of muscle, fascia and tendon at neck level, subsequent encounter: Secondary | ICD-10-CM | POA: Diagnosis not present

## 2018-07-07 DIAGNOSIS — M47812 Spondylosis without myelopathy or radiculopathy, cervical region: Secondary | ICD-10-CM | POA: Diagnosis not present

## 2018-07-10 ENCOUNTER — Telehealth: Payer: Self-pay | Admitting: *Deleted

## 2018-07-10 ENCOUNTER — Encounter: Payer: Self-pay | Admitting: *Deleted

## 2018-07-10 DIAGNOSIS — M542 Cervicalgia: Secondary | ICD-10-CM | POA: Insufficient documentation

## 2018-07-10 DIAGNOSIS — M47812 Spondylosis without myelopathy or radiculopathy, cervical region: Secondary | ICD-10-CM | POA: Insufficient documentation

## 2018-07-10 NOTE — Telephone Encounter (Signed)
PA sent via covermymed on 07/10/18.   Key: [id number]   Medication: cyclobenzaprine    Dx: G01.749 and M54.2   Per Dr. Anitra Lauth pt has tried and failed None.   Waiting for response.

## 2018-07-11 NOTE — Telephone Encounter (Signed)
PA Case: [id number] Status: Approved Coverage Starts on: 07/10/2018 12:00:00 AM Coverage Ends on: 08/02/2019 12:00:00 AM.

## 2018-07-17 ENCOUNTER — Telehealth: Payer: Self-pay | Admitting: Family Medicine

## 2018-07-17 NOTE — Telephone Encounter (Signed)
Copied from [place] [id number]. Topic: Quick Communication - See Telephone Encounter >> Jul 17, 2018  9:51 AM Bea Graff, NT wrote: CRM for notification. See Telephone encounter for: 07/17/18. Pt states that [employer] stated that the office did submit a auth for the PT and they will not cover until this has been submitted. Please advise.

## 2018-07-17 NOTE — Telephone Encounter (Signed)
Please contact patient

## 2018-07-19 ENCOUNTER — Other Ambulatory Visit: Payer: Self-pay

## 2018-07-19 ENCOUNTER — Ambulatory Visit: Payer: Medicare HMO | Attending: Family Medicine | Admitting: Physical Therapy

## 2018-07-19 DIAGNOSIS — R293 Abnormal posture: Secondary | ICD-10-CM

## 2018-07-19 DIAGNOSIS — M542 Cervicalgia: Secondary | ICD-10-CM | POA: Diagnosis not present
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
## 2018-07-21 ENCOUNTER — Encounter: Payer: Self-pay | Admitting: Physical Therapy

## 2018-07-21 ENCOUNTER — Ambulatory Visit: Payer: Medicare HMO | Admitting: Physical Therapy

## 2018-07-21 ENCOUNTER — Encounter: Payer: Medicare HMO | Admitting: Physical Therapy

## 2018-07-21 DIAGNOSIS — M542 Cervicalgia: Secondary | ICD-10-CM

## 2018-07-21 DIAGNOSIS — R293 Abnormal posture: Secondary | ICD-10-CM
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
## 2018-08-01 ENCOUNTER — Ambulatory Visit: Payer: Medicare HMO | Admitting: Physical Therapy

## 2018-08-01 ENCOUNTER — Encounter: Payer: Self-pay | Admitting: Physical Therapy

## 2018-08-01 DIAGNOSIS — M542 Cervicalgia: Secondary | ICD-10-CM | POA: Diagnosis not present

## 2018-08-01 DIAGNOSIS — R293 Abnormal posture: Secondary | ICD-10-CM | POA: Diagnosis not present
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
## 2018-08-08 ENCOUNTER — Ambulatory Visit: Payer: Medicare HMO | Admitting: Physical Therapy

## 2018-08-25 ENCOUNTER — Other Ambulatory Visit (INDEPENDENT_AMBULATORY_CARE_PROVIDER_SITE_OTHER): Payer: Medicare HMO

## 2018-08-25 DIAGNOSIS — I482 Chronic atrial fibrillation, unspecified: Secondary | ICD-10-CM | POA: Diagnosis not present

## 2018-08-25 LAB — PROTIME-INR
INR: 2.3 ratio — ABNORMAL HIGH (ref 0.8–1.0)
Prothrombin Time: 26.2 s — ABNORMAL HIGH (ref 9.6–13.1)

## 2018-08-28 ENCOUNTER — Other Ambulatory Visit: Payer: Self-pay

## 2018-08-28 DIAGNOSIS — I4891 Unspecified atrial fibrillation: Secondary | ICD-10-CM

## 2018-08-28 NOTE — Progress Notes (Signed)
Pt is to return x1 month (09/28/18) for PT/INR check. Future labs placed.

## 2018-10-10 ENCOUNTER — Other Ambulatory Visit (INDEPENDENT_AMBULATORY_CARE_PROVIDER_SITE_OTHER): Payer: Medicare HMO

## 2018-10-10 DIAGNOSIS — I4891 Unspecified atrial fibrillation: Secondary | ICD-10-CM | POA: Diagnosis not present

## 2018-10-10 LAB — PROTIME-INR
INR: 2.1 ratio — ABNORMAL HIGH (ref 0.8–1.0)
Prothrombin Time: 24.1 s — ABNORMAL HIGH (ref 9.6–13.1)

## 2018-10-11 ENCOUNTER — Encounter: Payer: Self-pay | Admitting: *Deleted

## 2018-10-26 NOTE — Telephone Encounter (Signed)
Called pt and he was notified of coumadin results and verbalized understanding

## 2018-11-08 ENCOUNTER — Other Ambulatory Visit: Payer: Self-pay

## 2018-11-08 ENCOUNTER — Other Ambulatory Visit (INDEPENDENT_AMBULATORY_CARE_PROVIDER_SITE_OTHER): Payer: 59

## 2018-11-08 DIAGNOSIS — I4891 Unspecified atrial fibrillation: Secondary | ICD-10-CM | POA: Diagnosis not present

## 2018-11-08 LAB — PROTIME-INR
INR: 2.4 ratio — ABNORMAL HIGH (ref 0.8–1.0)
Prothrombin Time: 27.2 s — ABNORMAL HIGH (ref 9.6–13.1)

## 2018-12-05 ENCOUNTER — Ambulatory Visit: Payer: Medicare HMO

## 2018-12-08 ENCOUNTER — Other Ambulatory Visit (INDEPENDENT_AMBULATORY_CARE_PROVIDER_SITE_OTHER): Payer: Medicare HMO

## 2018-12-08 DIAGNOSIS — I4891 Unspecified atrial fibrillation: Secondary | ICD-10-CM

## 2018-12-08 LAB — PROTIME-INR
INR: 2.2 ratio — ABNORMAL HIGH (ref 0.8–1.0)
Prothrombin Time: 25 s — ABNORMAL HIGH (ref 9.6–13.1)

## 2018-12-12 ENCOUNTER — Other Ambulatory Visit: Payer: Self-pay

## 2018-12-12 DIAGNOSIS — I482 Chronic atrial fibrillation, unspecified: Secondary | ICD-10-CM

## 2018-12-12 DIAGNOSIS — I2510 Atherosclerotic heart disease of native coronary artery without angina pectoris: Secondary | ICD-10-CM

## 2018-12-12 MED ORDER — IPRATROPIUM BROMIDE 0.06 % NA SOLN: NASAL | 0 refills | Status: DC

## 2018-12-12 MED ORDER — ATORVASTATIN CALCIUM 40 MG PO TABS: 40.0000 mg | ORAL_TABLET | Freq: Every day | ORAL | 0 refills | Status: DC

## 2019-01-05 ENCOUNTER — Other Ambulatory Visit: Payer: Self-pay

## 2019-01-05 ENCOUNTER — Ambulatory Visit (INDEPENDENT_AMBULATORY_CARE_PROVIDER_SITE_OTHER): Payer: Medicare HMO | Admitting: Family Medicine

## 2019-01-05 DIAGNOSIS — I482 Chronic atrial fibrillation, unspecified: Secondary | ICD-10-CM | POA: Diagnosis not present

## 2019-01-05 LAB — PROTIME-INR
INR: 2.6 ratio — ABNORMAL HIGH (ref 0.8–1.0)
Prothrombin Time: 29.5 s — ABNORMAL HIGH (ref 9.6–13.1)

## 2019-02-12 ENCOUNTER — Other Ambulatory Visit: Payer: Self-pay

## 2019-02-12 ENCOUNTER — Ambulatory Visit (INDEPENDENT_AMBULATORY_CARE_PROVIDER_SITE_OTHER): Payer: Medicare HMO | Admitting: Family Medicine

## 2019-02-12 DIAGNOSIS — I482 Chronic atrial fibrillation, unspecified: Secondary | ICD-10-CM

## 2019-02-13 LAB — PROTIME-INR
INR: 2.8 ratio — ABNORMAL HIGH (ref 0.8–1.0)
Prothrombin Time: 32.3 s — ABNORMAL HIGH (ref 9.6–13.1)

## 2019-02-14 ENCOUNTER — Other Ambulatory Visit: Payer: Self-pay

## 2019-02-14 DIAGNOSIS — I482 Chronic atrial fibrillation, unspecified: Secondary | ICD-10-CM

## 2019-02-18 ENCOUNTER — Other Ambulatory Visit: Payer: Self-pay | Admitting: Family Medicine

## 2019-02-18 DIAGNOSIS — I2510 Atherosclerotic heart disease of native coronary artery without angina pectoris: Secondary | ICD-10-CM

## 2019-02-18 DIAGNOSIS — I482 Chronic atrial fibrillation, unspecified: Secondary | ICD-10-CM

## 2019-03-14 ENCOUNTER — Other Ambulatory Visit: Payer: Self-pay | Admitting: Family Medicine

## 2019-03-15 DIAGNOSIS — H35371 Puckering of macula, right eye: Secondary | ICD-10-CM | POA: Diagnosis not present

## 2019-03-15 DIAGNOSIS — Z961 Presence of intraocular lens: Secondary | ICD-10-CM | POA: Diagnosis not present

## 2019-03-15 DIAGNOSIS — H5201 Hypermetropia, right eye: Secondary | ICD-10-CM | POA: Diagnosis not present

## 2019-03-15 DIAGNOSIS — H5212 Myopia, left eye: Secondary | ICD-10-CM | POA: Diagnosis not present

## 2019-04-01 DIAGNOSIS — I2510 Atherosclerotic heart disease of native coronary artery without angina pectoris: Secondary | ICD-10-CM | POA: Insufficient documentation

## 2019-04-01 NOTE — Progress Notes (Signed)
HPI The patient presents for follow up of CAD.  He has a history of CAD s/p CABG.  He has permanent atrial fib.   Since I last saw him he has done well.  The patient denies any new symptoms such as chest discomfort, neck or arm discomfort. There has been no new shortness of breath, PND or orthopnea. There have been no reported palpitations, presyncope or syncope.    Allergies  Allergen Reactions  . Sulfa Antibiotics Itching    Cannot remember what kind of reaction    Current Outpatient Medications  Medication Sig Dispense Refill  . acetaminophen (TYLENOL) 500 MG tablet Take 1,000 mg by mouth daily as needed for moderate pain or headache.    [name] atorvastatin (LIPITOR) 40 MG tablet TAKE 1 TABLET EVERY DAY 90 tablet 1  . cetirizine (ZYRTEC) 10 MG tablet Take 10 mg by mouth as needed for allergies.     . cyclobenzaprine (FLEXERIL) 10 MG tablet 1/2-1 tab po tid prn for muscle relaxation 90 tablet 1  . fluticasone (FLONASE) 50 MCG/ACT nasal spray Place 2 sprays into both nostrils daily.    [name] ipratropium (ATROVENT) 0.06 % nasal spray USE 2 SPRAYS IN EACH NOSTRIL THREE TIMES DAILY 105 mL 0  . metoprolol tartrate (LOPRESSOR) 25 MG tablet TAKE 1 TABLET TWICE DAILY 180 tablet 1  . Omega-3 Fatty Acids (FISH OIL PO) Take 360 mg by mouth every evening.     . triamcinolone ointment (TRIDERM) 0.1 % Apply 1 application topically 2 (two) times daily as needed (eczema).     . warfarin (COUMADIN) 5 MG tablet Takes 5mg  on Sun, Tue, Thu, Frida and Sat. 7.5mg  on Mon and QWed. 180 tablet 3  . ondansetron (ZOFRAN) 4 MG tablet Take 1 tablet (4 mg total) by mouth every 8 (eight) hours as needed for nausea or vomiting. (Patient not taking: Reported on 07/19/2018) 20 tablet 0   No current facility-administered medications for this visit.     Past Medical History:  Diagnosis Date  . Adenomatous colon polyp   . Allergic rhinitis    primarily seasonal  . Atrial fibrillation ([place])    peri op afib initially, then  dx'd with permanent a-fib 2014 (xarelto started, switched to coumadin for cost reasons)  . CAD (coronary artery disease)    CABG 2011  . Chronic idiopathic thrombocytopenia (HCC)    very mild  . Diverticulosis 2012 scope   pancolonic  . GERD (gastroesophageal reflux disease)   . Hearing impairment    AU; has hearing aids  . Hemorrhoids    GI referred pt to Dr. Arnoldo Morale 03/2018  . Hypercholesteremia   . Hypertension   . Pneumonia   . Prediabetes 05/2015; 03/2017   2016 A1c 5.8%.  2018 A1c 6.0%.  12/2017 A1c 5.9%.  [name] Ulcerative esophagitis     Past Surgical History:  Procedure Laterality Date  . CARDIOVASCULAR STRESS TEST  12/2012   Nuclear stress (exercise) testing: low risk scan  . CATARACT EXTRACTION    . COLONOSCOPY N/A 07/22/2016   Procedure: COLONOSCOPY;  Surgeon: Rogene Houston, MD;  [facility];  Service: Endoscopy;  Laterality: N/A;  1:55  . COLONOSCOPY W/ POLYPECTOMY  X 4, most recent 07/22/16   2017: tubular adenoma x 2 (recall 5 yrs--? age ?)  . CORONARY ARTERY BYPASS GRAFT  2011   L - LAD, S - diag, S - PDA/LV branch 2011.  Nl EF  . INGUINAL HERNIA REPAIR  1970s  right groin  . POLYPECTOMY  07/22/2016   Procedure: POLYPECTOMY;  Surgeon: Rogene Houston, MD;  [facility];  Service: Endoscopy;;  hepatic flexure,  sigmoid colon,   . TONSILLECTOMY AND ADENOIDECTOMY  as a child  . UPPER GI ENDOSCOPY  09/03/2010   hiatus hernia, no Barrett's esoph,esoph stricture dilated, gastric ulcers (h pylori neg)    ROS:  As stated in the HPI and negative for all other systems.  PHYSICAL EXAM BP 120/70   Pulse 79   Ht 5\' 10"  (1.778 m)   Wt 164 lb (74.4 kg)   BMI 23.53 kg/m   GENERAL:  Well appearing NECK:  No jugular venous distention, waveform within normal limits, carotid upstroke brisk and symmetric, no bruits, no thyromegaly LUNGS:  Clear to auscultation bilaterally CHEST:  Unremarkable HEART:  PMI not displaced or sustained,S1 and S2 within normal  limits, no S3, no clicks, no rubs, no murmurs, irregular ABD:  Flat, positive bowel sounds normal in frequency in pitch, no bruits, no rebound, no guarding, no midline pulsatile mass, no hepatomegaly, no splenomegaly EXT:  2 plus pulses throughout, no edema, no cyanosis no clubbing   EKG:  Atrial fib, rate 79, poor anterior all her progression, no acute ST-T wave changes. PVCs  04/04/2019    Lab Results  Component Value Date   CHOL 124 01/16/2018   TRIG 66.0 01/16/2018   HDL 45.30 01/16/2018   LDLCALC 65 01/16/2018    ASSESSMENT AND PLAN  ATRIAL FIBRILLATION: Mr. Angel Fasciano Chagnon Sr. has a CHA2DS2 - VASc score of 4 .   I will check labs to include a CBC today.   CAD: The patient has no new sypmtoms.  No further cardiovascular testing is indicated.  We will continue with aggressive risk reduction and meds as listed.  HTN:  The blood pressure is at target.  No change.   HYPERLIPIDEMIA:   LDL is at target.  I will repeat this.   ABNORMAL EKG: We talked for a while about his conduction disturbance and the fact that he could develop bradycardia arrhythmias in the future and he will let me know if he ever has any presyncope or syncope.

## 2019-04-03 DIAGNOSIS — H524 Presbyopia: Secondary | ICD-10-CM | POA: Diagnosis not present

## 2019-04-03 DIAGNOSIS — H52223 Regular astigmatism, bilateral: Secondary | ICD-10-CM | POA: Diagnosis not present

## 2019-04-04 ENCOUNTER — Encounter: Payer: Self-pay | Admitting: Cardiology

## 2019-04-04 ENCOUNTER — Ambulatory Visit (INDEPENDENT_AMBULATORY_CARE_PROVIDER_SITE_OTHER): Payer: Medicare HMO | Admitting: Cardiology

## 2019-04-04 ENCOUNTER — Other Ambulatory Visit: Payer: Self-pay

## 2019-04-04 VITALS — BP 120/70 | HR 79 | Ht 70.0 in | Wt 164.0 lb

## 2019-04-04 DIAGNOSIS — E785 Hyperlipidemia, unspecified: Secondary | ICD-10-CM | POA: Diagnosis not present

## 2019-04-04 DIAGNOSIS — I2510 Atherosclerotic heart disease of native coronary artery without angina pectoris: Secondary | ICD-10-CM

## 2019-04-04 DIAGNOSIS — Z79899 Other long term (current) drug therapy: Secondary | ICD-10-CM | POA: Diagnosis not present

## 2019-04-04 DIAGNOSIS — R9431 Abnormal electrocardiogram [ECG] [EKG]: Secondary | ICD-10-CM | POA: Diagnosis not present

## 2019-04-04 DIAGNOSIS — I10 Essential (primary) hypertension: Secondary | ICD-10-CM | POA: Diagnosis not present

## 2019-04-04 DIAGNOSIS — I4819 Other persistent atrial fibrillation: Secondary | ICD-10-CM

## 2019-04-04 NOTE — Patient Instructions (Signed)
Medication Instructions:  The current medical regimen is effective;  continue present plan and medications.  If you need a refill on your cardiac medications before your next appointment, please call your pharmacy.   Lab work: Please have fasting blood work at your primary care Dr's office.  (CMP, CBC, INR (overdue) and Lipid) If you have labs (blood work) drawn today and your tests are completely normal, you will receive your results only by: [name] MyChart Message (if you have MyChart) OR . A paper copy in the mail If you have any lab test that is abnormal or we need to change your treatment, we will call you to review the results.  Per Dr Percival Spanish - you may start having your INRs completed at the [facility] office.  You will be contacted to be scheduled for this.  Follow-Up: Follow up in 1 year with Dr. Percival Spanish in [place].  You will receive a letter in the mail 2 months before you are due.  Please call us when you receive this letter to schedule your follow up appointment.  Thank you for choosing [facility]!!

## 2019-04-11 ENCOUNTER — Ambulatory Visit (INDEPENDENT_AMBULATORY_CARE_PROVIDER_SITE_OTHER): Payer: Medicare HMO | Admitting: Family Medicine

## 2019-04-11 ENCOUNTER — Other Ambulatory Visit: Payer: Self-pay

## 2019-04-11 DIAGNOSIS — I482 Chronic atrial fibrillation, unspecified: Secondary | ICD-10-CM

## 2019-04-11 LAB — PROTIME-INR
INR: 3.3 ratio — ABNORMAL HIGH (ref 0.8–1.0)
Prothrombin Time: 37.7 s — ABNORMAL HIGH (ref 9.6–13.1)

## 2019-04-12 NOTE — Addendum Note (Signed)
Addended by: Marlene Bast A on: 04/12/2019 09:12 AM   Modules accepted: Orders

## 2019-04-16 ENCOUNTER — Other Ambulatory Visit: Payer: Self-pay | Admitting: *Deleted

## 2019-04-16 DIAGNOSIS — E785 Hyperlipidemia, unspecified: Secondary | ICD-10-CM

## 2019-04-16 DIAGNOSIS — I4821 Permanent atrial fibrillation: Secondary | ICD-10-CM | POA: Diagnosis not present

## 2019-04-16 DIAGNOSIS — I2510 Atherosclerotic heart disease of native coronary artery without angina pectoris: Secondary | ICD-10-CM | POA: Diagnosis not present

## 2019-04-16 DIAGNOSIS — I4819 Other persistent atrial fibrillation: Secondary | ICD-10-CM | POA: Diagnosis not present

## 2019-04-16 NOTE — Progress Notes (Signed)
LABS REORDER FOR [facility]

## 2019-04-16 NOTE — Addendum Note (Signed)
Addended by: Raiford Simmonds on: 04/16/2019 11:35 AM   Modules accepted: Orders

## 2019-04-17 LAB — CBC
Hematocrit: 52.8 % — ABNORMAL HIGH (ref 37.5–51.0)
Hemoglobin: 17.3 g/dL (ref 13.0–17.7)
MCH: 30.9 pg (ref 26.6–33.0)
MCHC: 32.8 g/dL (ref 31.5–35.7)
MCV: 95 fL (ref 79–97)
Platelets: 142 10*3/uL — ABNORMAL LOW (ref 150–450)
RBC: 5.59 x10E6/uL (ref 4.14–5.80)
RDW: 12.8 % (ref 11.6–15.4)
WBC: 6.1 10*3/uL (ref 3.4–10.8)

## 2019-04-17 LAB — COMPREHENSIVE METABOLIC PANEL
ALT: 28 IU/L (ref 0–44)
AST: 22 IU/L (ref 0–40)
Albumin/Globulin Ratio: 2.4 — ABNORMAL HIGH (ref 1.2–2.2)
Albumin: 4.8 g/dL — ABNORMAL HIGH (ref 3.6–4.6)
Alkaline Phosphatase: 71 IU/L (ref 39–117)
BUN/Creatinine Ratio: 13 (ref 10–24)
BUN: 12 mg/dL (ref 8–27)
Bilirubin Total: 1.5 mg/dL — ABNORMAL HIGH (ref 0.0–1.2)
CO2: 25 mmol/L (ref 20–29)
Calcium: 10.3 mg/dL — ABNORMAL HIGH (ref 8.6–10.2)
Chloride: 101 mmol/L (ref 96–106)
Creatinine, Ser: 0.91 mg/dL (ref 0.76–1.27)
GFR calc Af Amer: 90 mL/min/{1.73_m2} (ref >59)
GFR calc non Af Amer: 78 mL/min/{1.73_m2} (ref >59)
Globulin, Total: 2 g/dL (ref 1.5–4.5)
Glucose: 100 mg/dL — ABNORMAL HIGH (ref 65–99)
Potassium: 4.3 mmol/L (ref 3.5–5.2)
Sodium: 142 mmol/L (ref 134–144)
Total Protein: 6.8 g/dL (ref 6.0–8.5)

## 2019-04-17 LAB — LIPID PANEL
Chol/HDL Ratio: 2.8 ratio (ref 0.0–5.0)
Cholesterol, Total: 146 mg/dL (ref 100–199)
HDL: 53 mg/dL (ref >39)
LDL Chol Calc (NIH): 74 mg/dL (ref 0–99)
Triglycerides: 102 mg/dL (ref 0–149)
VLDL Cholesterol Cal: 19 mg/dL (ref 5–40)

## 2019-04-26 ENCOUNTER — Ambulatory Visit (INDEPENDENT_AMBULATORY_CARE_PROVIDER_SITE_OTHER): Payer: Medicare HMO | Admitting: Family Medicine

## 2019-04-26 ENCOUNTER — Other Ambulatory Visit: Payer: Self-pay

## 2019-04-26 DIAGNOSIS — Z23 Encounter for immunization: Secondary | ICD-10-CM | POA: Diagnosis not present

## 2019-04-26 DIAGNOSIS — I482 Chronic atrial fibrillation, unspecified: Secondary | ICD-10-CM | POA: Diagnosis not present

## 2019-04-27 LAB — PROTIME-INR
INR: 2.3 — ABNORMAL HIGH
Prothrombin Time: 22.5 s — ABNORMAL HIGH (ref 9.0–11.5)

## 2019-05-09 ENCOUNTER — Telehealth: Payer: Self-pay | Admitting: Cardiology

## 2019-05-09 NOTE — Telephone Encounter (Signed)
Patient heard an old message from his cell phone about some lab results, but the numbers in his message do not seem to match the numbers that are current in his MyChart. The most recent labs done for him were done 04/16/19.   He realizes that the voicemail on his cell phone may have been from a long time ago. He does not go through those messages on a regular basis. He was just alarmed at some of the results, and would like someone from the office to call him for further clarification.  He normally sees Dr. Percival Spanish at the [place] office

## 2019-05-09 NOTE — Telephone Encounter (Signed)
Patient reports he received a call/VM that his total cholesterol was >200 but he thinks he received this message in mistake. Reviewed his cholesterol #s per MyChart. No further assistance needed at this time

## 2019-05-24 ENCOUNTER — Ambulatory Visit (INDEPENDENT_AMBULATORY_CARE_PROVIDER_SITE_OTHER): Payer: Medicare HMO | Admitting: *Deleted

## 2019-05-24 ENCOUNTER — Other Ambulatory Visit: Payer: Self-pay

## 2019-05-24 DIAGNOSIS — Z5181 Encounter for therapeutic drug level monitoring: Secondary | ICD-10-CM

## 2019-05-24 DIAGNOSIS — I4819 Other persistent atrial fibrillation: Secondary | ICD-10-CM

## 2019-05-24 DIAGNOSIS — I4821 Permanent atrial fibrillation: Secondary | ICD-10-CM | POA: Diagnosis not present

## 2019-05-24 LAB — POCT INR: INR: 2.6 (ref 2.0–3.0)

## 2019-05-24 NOTE — Patient Instructions (Signed)
Continue warfarin 1 tablet daily except 1 1/2 tablets on Mondays and Wednesdays Recheck in 6 weeks 

## 2019-06-26 ENCOUNTER — Encounter (INDEPENDENT_AMBULATORY_CARE_PROVIDER_SITE_OTHER): Payer: Self-pay | Admitting: *Deleted

## 2019-07-02 ENCOUNTER — Other Ambulatory Visit: Payer: Self-pay | Admitting: Family Medicine

## 2019-07-02 ENCOUNTER — Telehealth: Payer: Self-pay | Admitting: *Deleted

## 2019-07-02 NOTE — Telephone Encounter (Signed)
Please call patient in regards to an upcoming appointment for his coumdin.

## 2019-07-02 NOTE — Telephone Encounter (Signed)
FYI  RF request for warfarin denied.  Contacted patient and he is going to have his cardiologist manage INR since he and his wife have to take warfarin- this will cut down on multiple trips to different locations.  Patient advised that I would deny refill request for warfarin and he should contact cardiology for refills.   Also, told patient he is overdue for follow up.  Patient is waiting on surgery for wife - she has tumor on thyroid.  Once this is scheduled, he will come in for appt.

## 2019-07-02 NOTE — Telephone Encounter (Signed)
Call and spoke with wife.  [facility] did not have INR appt scheduled on 07/05/19 when wife gets hers checked.  He was added on the schedule.  Wife appreciative.

## 2019-07-05 ENCOUNTER — Other Ambulatory Visit: Payer: Self-pay

## 2019-07-05 ENCOUNTER — Ambulatory Visit (INDEPENDENT_AMBULATORY_CARE_PROVIDER_SITE_OTHER): Payer: Medicare HMO | Admitting: *Deleted

## 2019-07-05 DIAGNOSIS — I4821 Permanent atrial fibrillation: Secondary | ICD-10-CM | POA: Diagnosis not present

## 2019-07-05 DIAGNOSIS — Z5181 Encounter for therapeutic drug level monitoring: Secondary | ICD-10-CM | POA: Diagnosis not present

## 2019-07-05 DIAGNOSIS — I4819 Other persistent atrial fibrillation: Secondary | ICD-10-CM | POA: Diagnosis not present

## 2019-07-05 LAB — POCT INR: INR: 2.7 (ref 2.0–3.0)

## 2019-07-05 NOTE — Patient Instructions (Signed)
Continue warfarin 1 tablet daily except 1 1/2 tablets on Mondays and Wednesdays Recheck in 6 weeks 

## 2019-07-13 ENCOUNTER — Other Ambulatory Visit: Payer: Self-pay | Admitting: Family Medicine

## 2019-07-13 DIAGNOSIS — I482 Chronic atrial fibrillation, unspecified: Secondary | ICD-10-CM

## 2019-07-13 DIAGNOSIS — I2510 Atherosclerotic heart disease of native coronary artery without angina pectoris: Secondary | ICD-10-CM

## 2019-07-16 ENCOUNTER — Other Ambulatory Visit: Payer: Self-pay

## 2019-07-16 DIAGNOSIS — I482 Chronic atrial fibrillation, unspecified: Secondary | ICD-10-CM

## 2019-07-16 DIAGNOSIS — I2510 Atherosclerotic heart disease of native coronary artery without angina pectoris: Secondary | ICD-10-CM

## 2019-07-16 MED ORDER — METOPROLOL TARTRATE 25 MG PO TABS: 25.0000 mg | ORAL_TABLET | Freq: Two times a day (BID) | ORAL | 2 refills | Status: DC

## 2019-07-16 MED ORDER — WARFARIN SODIUM 5 MG PO TABS: ORAL_TABLET | ORAL | 3 refills | Status: DC

## 2019-07-16 MED ORDER — ATORVASTATIN CALCIUM 40 MG PO TABS: 40.0000 mg | ORAL_TABLET | Freq: Every day | ORAL | 2 refills | Status: DC

## 2019-07-16 NOTE — Telephone Encounter (Signed)
OK to refill all.

## 2019-07-16 NOTE — Telephone Encounter (Signed)
Pt called in stating that he needs Dr. Percival Spanish to refill some of his medications that used to be managed by Dr. Anitra Lauth. Pt states his primary will no longer prescribe these medications because he now has a cardiologist. Pt would like a call back at [phone].

## 2019-07-19 ENCOUNTER — Other Ambulatory Visit: Payer: Self-pay

## 2019-07-19 DIAGNOSIS — I2510 Atherosclerotic heart disease of native coronary artery without angina pectoris: Secondary | ICD-10-CM

## 2019-07-19 DIAGNOSIS — I482 Chronic atrial fibrillation, unspecified: Secondary | ICD-10-CM

## 2019-07-19 MED ORDER — ATORVASTATIN CALCIUM 40 MG PO TABS: 40.0000 mg | ORAL_TABLET | Freq: Every day | ORAL | 3 refills | Status: DC

## 2019-07-19 MED ORDER — METOPROLOL TARTRATE 25 MG PO TABS: 25.0000 mg | ORAL_TABLET | Freq: Two times a day (BID) | ORAL | 3 refills | Status: DC

## 2019-07-23 MED ORDER — METOPROLOL TARTRATE 25 MG PO TABS: 25.0000 mg | ORAL_TABLET | Freq: Two times a day (BID) | ORAL | 3 refills | Status: DC

## 2019-07-23 MED ORDER — ATORVASTATIN CALCIUM 40 MG PO TABS: 40.0000 mg | ORAL_TABLET | Freq: Every day | ORAL | 3 refills | Status: DC

## 2019-07-23 NOTE — Telephone Encounter (Addendum)
Nurse reviewed pt med list. Noted that refill of Coumadin was sent to pt pharmacy on 12/14  Nurse resent metoprolol and atorvastatin refills

## 2019-07-23 NOTE — Addendum Note (Signed)
Addended by: Annita Brod on: 07/23/2019 03:38 PM   Modules accepted: Orders

## 2019-08-07 ENCOUNTER — Other Ambulatory Visit: Payer: Self-pay

## 2019-08-07 ENCOUNTER — Ambulatory Visit (INDEPENDENT_AMBULATORY_CARE_PROVIDER_SITE_OTHER): Payer: Medicare HMO | Admitting: Family Medicine

## 2019-08-07 ENCOUNTER — Encounter: Payer: Self-pay | Admitting: Family Medicine

## 2019-08-07 VITALS — BP 141/84 | HR 85 | Temp 98.2°F | Resp 16 | Ht 69.0 in | Wt 167.8 lb

## 2019-08-07 DIAGNOSIS — I4821 Permanent atrial fibrillation: Secondary | ICD-10-CM

## 2019-08-07 DIAGNOSIS — I2510 Atherosclerotic heart disease of native coronary artery without angina pectoris: Secondary | ICD-10-CM

## 2019-08-07 DIAGNOSIS — Z0000 Encounter for general adult medical examination without abnormal findings: Secondary | ICD-10-CM

## 2019-08-07 DIAGNOSIS — Z7901 Long term (current) use of anticoagulants: Secondary | ICD-10-CM

## 2019-08-07 DIAGNOSIS — R7303 Prediabetes: Secondary | ICD-10-CM | POA: Diagnosis not present

## 2019-08-07 NOTE — Progress Notes (Signed)
Office Note 08/07/2019  CC:  Chief Complaint  Patient presents with  . Annual Exam    pt is fasting    HPI:  Angel Skura Anglemyer Sr. is a 86 y.o. White male who is here for annual health maintenance exam.  Feeling fine. Some activity in house, but not exercising otherwise.  Since last INR he has been changed by his cardiologist to getting this followed by an INR clinic in [place], [place]. Cardiology also taking over managing/rx'ing all cardiac meds.  Home bp monitoring: consistently < 130/80.  Past Medical History:  Diagnosis Date  . Adenomatous colon polyp   . Allergic rhinitis    primarily seasonal  . Atrial fibrillation ([place])    peri op afib initially, then dx'd with permanent a-fib 2014 (xarelto started, switched to coumadin for cost reasons)  . CAD (coronary artery disease)    CABG 2011  . Chronic idiopathic thrombocytopenia (HCC)    very mild  . Diverticulosis 2012 scope   pancolonic  . GERD (gastroesophageal reflux disease)   . Hearing impairment    [place]; has hearing aids  . Hemorrhoids    GI referred pt to Dr. Arnoldo Morale 03/2018  . Hypercholesteremia   . Hypertension   . Pneumonia   . Prediabetes 05/2015; 03/2017   2016 A1c 5.8%.  2018 A1c 6.0%.  12/2017 A1c 5.9%.  [name] Ulcerative esophagitis     Past Surgical History:  Procedure Laterality Date  . CARDIOVASCULAR STRESS TEST  12/2012   Nuclear stress (exercise) testing: low risk scan  . CATARACT EXTRACTION    . COLONOSCOPY N/A 07/22/2016   Procedure: COLONOSCOPY;  Surgeon: Rogene Houston, MD;  [facility];  Service: Endoscopy;  Laterality: N/A;  1:55  . COLONOSCOPY W/ POLYPECTOMY  X 4, most recent 07/22/16   2017: tubular adenoma x 2 (recall 5 yrs--? age ?)  . CORONARY ARTERY BYPASS GRAFT  2011   L - LAD, S - diag, S - PDA/LV branch 2011.  Nl EF  . INGUINAL HERNIA REPAIR  1970s   right groin  . POLYPECTOMY  07/22/2016   Procedure: POLYPECTOMY;  Surgeon: Rogene Houston, MD;  Location: [facility];   Service: Endoscopy;;  hepatic flexure,  sigmoid colon,   . TONSILLECTOMY AND ADENOIDECTOMY  as a child  . UPPER GI ENDOSCOPY  09/03/2010   hiatus hernia, no Barrett's esoph,esoph stricture dilated, gastric ulcers (h pylori neg)    Family History  Problem Relation Age of Onset  . Stroke Mother   . Pneumonia Father   . Diabetes Father   . Coronary artery disease Brother 50       stent  . Diabetes Brother     Social History   Socioeconomic History  . Marital status: Married    Spouse name: Not on file  . Number of children: 5  . Years of education: Not on file  . Highest education level: Not on file  Occupational History  . Occupation: Retired  Tobacco Use  . Smoking status: Never Smoker  . Smokeless tobacco: Never Used  Substance and Sexual Activity  . Alcohol use: No    Alcohol/week: 0.0 standard drinks  . Drug use: No  . Sexual activity: Not on file  Other Topics Concern  . Not on file  Social History Narrative   Married, 5 children, 10 grandchildren.   Relocated to [place] from [place] in 2012 for warmer climate.  Orig from [place], [place].   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   Hobby: woodworking.   No T/A/Ds.   Exercise: treadmill and stationary bike--issue with knee temporarily got him out of routine--set to restart 03/2013.   Diet: prudent diet but nothing drastic.         Social Determinants of Health   Financial Resource Strain:   . Difficulty of Paying Living Expenses: Not on file  Food Insecurity:   . Worried About Charity fundraiser in the Last Year: Not on file  . Ran Out of Food in the Last Year: Not on file  Transportation Needs:   . Lack of Transportation (Medical): Not on file  . Lack of Transportation (Non-Medical): Not on file  Physical Activity:   . Days of Exercise per Week: Not on file  . Minutes of Exercise per Session: Not on file  Stress:   . Feeling of Stress : Not on file  Social Connections:   . Frequency of  Communication with Friends and Family: Not on file  . Frequency of Social Gatherings with Friends and Family: Not on file  . Attends Religious Services: Not on file  . Active Member of Clubs or Organizations: Not on file  . Attends Archivist Meetings: Not on file  . Marital Status: Not on file  Intimate Partner Violence:   . Fear of Current or Ex-Partner: Not on file  . Emotionally Abused: Not on file  . Physically Abused: Not on file  . Sexually Abused: Not on file    Outpatient Medications Prior to Visit  Medication Sig Dispense Refill  . acetaminophen (TYLENOL) 500 MG tablet Take 1,000 mg by mouth daily as needed for moderate pain or headache.    [name] atorvastatin (LIPITOR) 40 MG tablet Take 1 tablet (40 mg total) by mouth daily. 90 tablet 3  . cetirizine (ZYRTEC) 10 MG tablet Take 10 mg by mouth as needed for allergies.     . fluticasone (FLONASE) 50 MCG/ACT nasal spray Place 2 sprays into both nostrils daily.    [name] ipratropium (ATROVENT) 0.06 % nasal spray USE 2 SPRAYS IN EACH NOSTRIL THREE TIMES DAILY 105 mL 0  . metoprolol tartrate (LOPRESSOR) 25 MG tablet Take 1 tablet (25 mg total) by mouth 2 (two) times daily. 180 tablet 3  . Omega-3 Fatty Acids (FISH OIL PO) Take 360 mg by mouth every evening.     . triamcinolone ointment (TRIDERM) 0.1 % Apply 1 application topically 2 (two) times daily as needed (eczema).     . warfarin (COUMADIN) 5 MG tablet Takes 5mg  on Sun, Tue, Thu, Frida and Sat. 7.5mg  on Mon and QWed. 180 tablet 3  . cyclobenzaprine (FLEXERIL) 10 MG tablet 1/2-1 tab po tid prn for muscle relaxation (Patient not taking: Reported on 08/07/2019) 90 tablet 1  . ondansetron (ZOFRAN) 4 MG tablet Take 1 tablet (4 mg total) by mouth every 8 (eight) hours as needed for nausea or vomiting. (Patient not taking: Reported on 07/19/2018) 20 tablet 0   No facility-administered medications prior to visit.    Allergies  Allergen Reactions  . Sulfa Antibiotics Itching    Cannot  remember what kind of reaction    ROS Review of Systems  Constitutional: Negative for appetite change, chills, fatigue and fever.  HENT: Negative for congestion, dental problem, ear pain and sore throat.   Eyes: Negative for discharge, redness and visual disturbance.  Respiratory: Negative for cough, chest tightness, shortness of breath and wheezing.   Cardiovascular: Negative for chest pain, palpitations and  leg swelling.  Gastrointestinal: Negative for abdominal pain, blood in stool, diarrhea, nausea and vomiting.  Genitourinary: Negative for difficulty urinating, dysuria, flank pain, frequency, hematuria and urgency.  Musculoskeletal: Negative for arthralgias, back pain, joint swelling, myalgias and neck stiffness.  Skin: Negative for pallor and rash.  Neurological: Negative for dizziness, speech difficulty, weakness and headaches.  Hematological: Negative for adenopathy. Does not bruise/bleed easily.  Psychiatric/Behavioral: Negative for confusion and sleep disturbance. The patient is not nervous/anxious.     PE; Blood pressure (!) 141/84, pulse 85, temperature 98.2 F (36.8 C), temperature source Temporal, resp. rate 16, height 5\' 9"  (1.753 m), weight 167 lb 12.8 oz (76.1 kg), SpO2 100 %. Body mass index is 24.78 kg/m.  Gen: Alert, well appearing.  Patient is oriented to person, place, time, and situation. AFFECT: pleasant, lucid thought and speech. ENT: Ears: EACs clear, normal epithelium.  TMs with good light reflex and landmarks bilaterally.  Eyes: no injection, icteris, swelling, or exudate.  EOMI, PERRLA. Nose: no drainage or turbinate edema/swelling.  No injection or focal lesion.  Mouth: lips without lesion/swelling.  Oral mucosa pink and moist.  Dentition intact and without obvious caries or gingival swelling.  Oropharynx without erythema, exudate, or swelling.  Neck: supple/nontender.  No LAD, mass, or TM.  Carotid pulses 2+ bilaterally, without bruits. CV: Irreg irreg, no  m/r/g.   LUNGS: CTA bilat, nonlabored resps, good aeration in all lung fields. ABD: soft, NT, ND, BS normal.  No hepatospenomegaly or mass.  No bruits. EXT: no clubbing or cyanosis.  R LL 1+ pitting, L LL trace pitting edema.  Mild skin freckling pigment changes and small varicosities c/w chronic venous insufficiency.  Musculoskeletal: no joint swelling, erythema, warmth, or tenderness.  ROM of all joints intact. Skin - no sores or suspicious lesions or rashes or color changes RECTAL: pt declined  Pertinent labs:  Lab Results  Component Value Date   TSH 1.99 05/26/2015   Lab Results  Component Value Date   WBC 6.1 04/16/2019   HGB 17.3 04/16/2019   HCT 52.8 (H) 04/16/2019   MCV 95 04/16/2019   PLT 142 (L) 04/16/2019   Lab Results  Component Value Date   CREATININE 0.91 04/16/2019   BUN 12 04/16/2019   NA 142 04/16/2019   K 4.3 04/16/2019   CL 101 04/16/2019   CO2 25 04/16/2019   Lab Results  Component Value Date   ALT 28 04/16/2019   AST 22 04/16/2019   ALKPHOS 71 04/16/2019   BILITOT 1.5 (H) 04/16/2019   Lab Results  Component Value Date   CHOL 146 04/16/2019   Lab Results  Component Value Date   HDL 53 04/16/2019   Lab Results  Component Value Date   LDLCALC 74 04/16/2019   Lab Results  Component Value Date   TRIG 102 04/16/2019   Lab Results  Component Value Date   CHOLHDL 2.8 04/16/2019   Lab Results  Component Value Date   PSA 1.43 05/26/2015   PSA 1.01 03/20/2013   Lab Results  Component Value Date   HGBA1C 5.9 07/06/2018   Lab Results  Component Value Date   INR 2.7 07/05/2019   INR 2.6 05/24/2019   INR 2.3 (H) 04/26/2019    ASSESSMENT AND PLAN:   Health maintenance exam: Reviewed age and gender appropriate health maintenance issues (prudent diet, regular exercise, health risks of tobacco and excessive alcohol, use of seatbelts, fire alarms in home, use of sunscreen).  Also reviewed age and gender  appropriate health screening as well  as vaccine recommendations. Vaccines: all UTD.  Shingrix->he did get this vaccine [facility] in [place]. Labs: CBC, CMET, FLP, HbA1c (prediabetes)-->future lab visit when fasting. Prostate ca screening: pt declines. Colon ca screening: rpt due 2022 if he chooses to get this (->aged out?).  Of note, he'll now be getting q65mo cardiology f/u AND getting his INR monitoring via [facility] cardiology clinic in [place], [place]. No PT/INR was done today.  An After Visit Summary was printed and given to the patient.  FOLLOW UP:  Return in about 1 year (around 08/06/2020) for annual CPE (fasting).  Signed:  Crissie Sickles, MD           08/07/2019

## 2019-08-07 NOTE — Patient Instructions (Signed)

## 2019-08-10 ENCOUNTER — Ambulatory Visit (INDEPENDENT_AMBULATORY_CARE_PROVIDER_SITE_OTHER): Payer: Medicare HMO | Admitting: Family Medicine

## 2019-08-10 ENCOUNTER — Other Ambulatory Visit: Payer: Self-pay

## 2019-08-10 DIAGNOSIS — Z7901 Long term (current) use of anticoagulants: Secondary | ICD-10-CM

## 2019-08-10 DIAGNOSIS — I2510 Atherosclerotic heart disease of native coronary artery without angina pectoris: Secondary | ICD-10-CM | POA: Diagnosis not present

## 2019-08-10 DIAGNOSIS — R7303 Prediabetes: Secondary | ICD-10-CM | POA: Diagnosis not present

## 2019-08-10 DIAGNOSIS — I4821 Permanent atrial fibrillation: Secondary | ICD-10-CM | POA: Diagnosis not present

## 2019-08-10 NOTE — Addendum Note (Signed)
Addended by: Ralph Dowdy on: 08/10/2019 08:40 AM   Modules accepted: Orders

## 2019-08-11 LAB — COMPREHENSIVE METABOLIC PANEL
AG Ratio: 2.2 (calc) (ref 1.0–2.5)
ALT: 22 U/L (ref 9–46)
AST: 20 U/L (ref 10–35)
Albumin: 4.3 g/dL (ref 3.6–5.1)
Alkaline phosphatase (APISO): 57 U/L (ref 35–144)
BUN: 14 mg/dL (ref 7–25)
CO2: 26 mmol/L (ref 20–32)
Calcium: 9.8 mg/dL (ref 8.6–10.3)
Chloride: 105 mmol/L (ref 98–110)
Creat: 1.09 mg/dL (ref 0.70–1.11)
Globulin: 2 g/dL (calc) (ref 1.9–3.7)
Glucose, Bld: 131 mg/dL — ABNORMAL HIGH (ref 65–99)
Potassium: 4.4 mmol/L (ref 3.5–5.3)
Sodium: 141 mmol/L (ref 135–146)
Total Bilirubin: 1 mg/dL (ref 0.2–1.2)
Total Protein: 6.3 g/dL (ref 6.1–8.1)

## 2019-08-11 LAB — LIPID PANEL
Cholesterol: 139 mg/dL (ref <200)
HDL: 42 mg/dL (ref >=40)
LDL Cholesterol (Calc): 79 mg/dL (calc)
Non-HDL Cholesterol (Calc): 97 mg/dL (calc) (ref <130)
Total CHOL/HDL Ratio: 3.3 (calc) (ref <5.0)
Triglycerides: 99 mg/dL (ref <150)

## 2019-08-11 LAB — CBC WITH DIFFERENTIAL/PLATELET
Absolute Monocytes: 518 cells/uL (ref 200–950)
Basophils Absolute: 104 cells/uL (ref 0–200)
Basophils Relative: 1.5 %
Eosinophils Absolute: 359 cells/uL (ref 15–500)
Eosinophils Relative: 5.2 %
HCT: 49.8 % (ref 38.5–50.0)
Hemoglobin: 17 g/dL (ref 13.2–17.1)
Lymphs Abs: 1635 cells/uL (ref 850–3900)
MCH: 31 pg (ref 27.0–33.0)
MCHC: 34.1 g/dL (ref 32.0–36.0)
MCV: 90.7 fL (ref 80.0–100.0)
MPV: 11.4 fL (ref 7.5–12.5)
Monocytes Relative: 7.5 %
Neutro Abs: 4285 cells/uL (ref 1500–7800)
Neutrophils Relative %: 62.1 %
Platelets: 149 10*3/uL (ref 140–400)
RBC: 5.49 10*6/uL (ref 4.20–5.80)
RDW: 12.3 % (ref 11.0–15.0)
Total Lymphocyte: 23.7 %
WBC: 6.9 10*3/uL (ref 3.8–10.8)

## 2019-08-11 LAB — HEMOGLOBIN A1C
Hgb A1c MFr Bld: 6 % of total Hgb — ABNORMAL HIGH (ref <5.7)
Mean Plasma Glucose: 126 (calc)
eAG (mmol/L): 7 (calc)

## 2019-08-14 ENCOUNTER — Encounter: Payer: Self-pay | Admitting: Family Medicine

## 2019-08-14 ENCOUNTER — Other Ambulatory Visit: Payer: Self-pay | Admitting: Family Medicine

## 2019-08-14 DIAGNOSIS — R739 Hyperglycemia, unspecified: Secondary | ICD-10-CM

## 2019-08-16 ENCOUNTER — Other Ambulatory Visit: Payer: Self-pay

## 2019-08-16 ENCOUNTER — Ambulatory Visit (INDEPENDENT_AMBULATORY_CARE_PROVIDER_SITE_OTHER): Payer: Medicare HMO | Admitting: *Deleted

## 2019-08-16 DIAGNOSIS — I4819 Other persistent atrial fibrillation: Secondary | ICD-10-CM | POA: Diagnosis not present

## 2019-08-16 DIAGNOSIS — Z5181 Encounter for therapeutic drug level monitoring: Secondary | ICD-10-CM

## 2019-08-16 DIAGNOSIS — I4821 Permanent atrial fibrillation: Secondary | ICD-10-CM | POA: Diagnosis not present

## 2019-08-16 LAB — POCT INR: INR: 2.2 (ref 2.0–3.0)

## 2019-08-16 NOTE — Patient Instructions (Signed)
Continue warfarin 1 tablet daily except 1 1/2 tablets on Mondays and Wednesdays Recheck in 4 weeks

## 2019-08-21 ENCOUNTER — Telehealth: Payer: Self-pay | Admitting: Family Medicine

## 2019-08-21 NOTE — Telephone Encounter (Signed)
Patient advised to fast for lab only appointment.

## 2019-08-21 NOTE — Telephone Encounter (Signed)
Patient has lab appt tomorrow. Does he need to be fasting?  Please call [phone].

## 2019-08-22 ENCOUNTER — Other Ambulatory Visit: Payer: Self-pay

## 2019-08-22 ENCOUNTER — Ambulatory Visit (INDEPENDENT_AMBULATORY_CARE_PROVIDER_SITE_OTHER): Payer: Medicare HMO | Admitting: Family Medicine

## 2019-08-22 DIAGNOSIS — R739 Hyperglycemia, unspecified: Secondary | ICD-10-CM | POA: Diagnosis not present

## 2019-08-22 LAB — GLUCOSE, RANDOM: Glucose, Bld: 125 mg/dL — ABNORMAL HIGH (ref 70–99)

## 2019-08-23 ENCOUNTER — Encounter: Payer: Self-pay | Admitting: Family Medicine

## 2019-09-13 ENCOUNTER — Other Ambulatory Visit: Payer: Self-pay

## 2019-09-13 ENCOUNTER — Ambulatory Visit (INDEPENDENT_AMBULATORY_CARE_PROVIDER_SITE_OTHER): Payer: Medicare HMO | Admitting: *Deleted

## 2019-09-13 DIAGNOSIS — Z5181 Encounter for therapeutic drug level monitoring: Secondary | ICD-10-CM | POA: Diagnosis not present

## 2019-09-13 DIAGNOSIS — I4821 Permanent atrial fibrillation: Secondary | ICD-10-CM | POA: Diagnosis not present

## 2019-09-13 DIAGNOSIS — I4819 Other persistent atrial fibrillation: Secondary | ICD-10-CM

## 2019-09-13 LAB — POCT INR: INR: 2 (ref 2.0–3.0)

## 2019-09-13 NOTE — Patient Instructions (Signed)
Take warfarin 1 1/2 tablets tonight then resume 1 tablet daily except 1 1/2 tablets on Mondays and Wednesdays Recheck in 4 weeks

## 2019-10-02 ENCOUNTER — Other Ambulatory Visit: Payer: Self-pay | Admitting: Family Medicine

## 2019-10-02 NOTE — Telephone Encounter (Signed)
RF request for ipatropium LOV:08/07/19 Next ov: advised to f/u 1 year Last written:03/15/19 (105,1)   RF request for cyclobenzaprine LOV:08/07/19 Next ov: advised to f/u 1 year Last written:07/06/18(90,1)   Please advise, thanks. Medications pending

## 2019-10-11 ENCOUNTER — Ambulatory Visit (INDEPENDENT_AMBULATORY_CARE_PROVIDER_SITE_OTHER): Payer: Medicare HMO | Admitting: *Deleted

## 2019-10-11 ENCOUNTER — Other Ambulatory Visit: Payer: Self-pay

## 2019-10-11 DIAGNOSIS — I4821 Permanent atrial fibrillation: Secondary | ICD-10-CM | POA: Diagnosis not present

## 2019-10-11 DIAGNOSIS — I4819 Other persistent atrial fibrillation: Secondary | ICD-10-CM | POA: Diagnosis not present

## 2019-10-11 DIAGNOSIS — Z5181 Encounter for therapeutic drug level monitoring: Secondary | ICD-10-CM | POA: Diagnosis not present

## 2019-10-11 LAB — POCT INR: INR: 2.2 (ref 2.0–3.0)

## 2019-10-11 NOTE — Patient Instructions (Signed)
Continue warfarin 1 tablet daily except 1 1/2 tablets on Mondays and Wednesdays Recheck in 3 weeks

## 2019-10-26 ENCOUNTER — Other Ambulatory Visit: Payer: Self-pay | Admitting: Family Medicine

## 2019-10-26 ENCOUNTER — Telehealth: Payer: Self-pay

## 2019-10-26 DIAGNOSIS — K649 Unspecified hemorrhoids: Secondary | ICD-10-CM

## 2019-10-26 NOTE — Telephone Encounter (Signed)
Patient is currently due for next colonoscopy and would like to be referred to Dr.Gessner. Last colonoscopy done by Dr.Rehman. His last visit was 08/09/19.   Please advise if okay for referral, thanks.

## 2019-10-26 NOTE — Telephone Encounter (Signed)
Patient would like a referral to see Dr. Carlean Purl for a colonoscopy.  He stated that Dr. Anitra Lauth has referred him before but he said he failed to follow thru with the appt.  Please call [phone].

## 2019-10-26 NOTE — Telephone Encounter (Signed)
Left detailed message. Okay per DPR 

## 2019-10-26 NOTE — Telephone Encounter (Signed)
Tell Angel Ward that I reviewed his chart. His last colonoscopy was December 2017. The recommendation after that one was repeat in 5 years, which would be December 2022. I'll make a referral to Dr. Carlean Purl summer/fall of 2022.-thx

## 2019-10-26 NOTE — Telephone Encounter (Signed)
Addendum to my previous note on Del's GI referral. I'll make the referral now based on c/o hemorrhoid problems.-thx

## 2019-10-26 NOTE — Telephone Encounter (Signed)
Pt called and is asking for referral to [facility] to discuss his hemorrhoids and also to get his repeat Colonoscopy.  Last CPE was done 01/05/202.

## 2019-10-26 NOTE — Telephone Encounter (Signed)
Updated telephone message addressing this sent to PCP and pt notified, left message okay per [facility]

## 2019-11-01 ENCOUNTER — Other Ambulatory Visit: Payer: Self-pay

## 2019-11-01 ENCOUNTER — Ambulatory Visit (INDEPENDENT_AMBULATORY_CARE_PROVIDER_SITE_OTHER): Payer: Medicare HMO | Admitting: *Deleted

## 2019-11-01 DIAGNOSIS — I4819 Other persistent atrial fibrillation: Secondary | ICD-10-CM

## 2019-11-01 DIAGNOSIS — I4821 Permanent atrial fibrillation: Secondary | ICD-10-CM

## 2019-11-01 DIAGNOSIS — Z5181 Encounter for therapeutic drug level monitoring: Secondary | ICD-10-CM | POA: Diagnosis not present

## 2019-11-01 LAB — POCT INR: INR: 2 (ref 2.0–3.0)

## 2019-11-01 NOTE — Patient Instructions (Signed)
Take warfarin 1 1/2 tablets tonight then resume 1 tablet daily except 1 1/2 tablets on Mondays and Wednesdays Recheck in 4 weeks

## 2019-12-01 HISTORY — PX: HEMORRHOID BANDING: SHX5850

## 2019-12-10 ENCOUNTER — Other Ambulatory Visit: Payer: Self-pay

## 2019-12-10 ENCOUNTER — Ambulatory Visit (INDEPENDENT_AMBULATORY_CARE_PROVIDER_SITE_OTHER): Payer: Medicare HMO | Admitting: *Deleted

## 2019-12-10 DIAGNOSIS — I4819 Other persistent atrial fibrillation: Secondary | ICD-10-CM | POA: Diagnosis not present

## 2019-12-10 DIAGNOSIS — Z5181 Encounter for therapeutic drug level monitoring: Secondary | ICD-10-CM | POA: Diagnosis not present

## 2019-12-10 DIAGNOSIS — I4821 Permanent atrial fibrillation: Secondary | ICD-10-CM | POA: Diagnosis not present

## 2019-12-10 LAB — POCT INR: INR: 2.2 (ref 2.0–3.0)

## 2019-12-10 NOTE — Patient Instructions (Signed)
Continue warfarin 1 tablet daily except 1 1/2 tablets on Mondays and Wednesdays Recheck in 4 weeks

## 2019-12-18 ENCOUNTER — Ambulatory Visit: Payer: Medicare HMO | Admitting: Internal Medicine

## 2019-12-18 ENCOUNTER — Encounter: Payer: Self-pay | Admitting: Internal Medicine

## 2019-12-18 VITALS — BP 110/68 | HR 72 | Ht 69.0 in | Wt 169.0 lb

## 2019-12-18 DIAGNOSIS — K642 Third degree hemorrhoids: Secondary | ICD-10-CM | POA: Diagnosis not present

## 2019-12-18 NOTE — Progress Notes (Signed)
Angel WERTHEIMER Sr. 86 y.o. April 13, 1934 [id number]  Assessment & Plan:   Encounter Diagnosis  Name Primary?  . Prolapsed internal hemorrhoids, grade 3 Yes    Right posterior complex banded today.  There is a small external component to this as well.  Aware that the patient is on warfarin advised to let us know if he has more than typical bleeding after this.  Current practice is to perform these procedures while on anticoagulation.  The patient will return in 1 month for  follow-up and possible additional banding as required. No complications were encountered and the patient tolerated the procedure well.  I appreciate the opportunity to care for this patient. CC: McGowen, Adrian Blackwater, MD   Subjective:   Chief Complaint: Hemorrhoids  HPI The patient is an 86 year old white man with a history of GERD, adenomatous colon polyps with last colonoscopy 2017 by Dr. Laural Golden, atrial fibrillation on warfarin, mild thrombocytopenia and a history of coronary artery disease status post CABG.  He complains of a several month history of a hemorrhoid with prolapsing and creating difficulty with defecation.  He does not think he became constipated until his hemorrhoid became problematic.  It protrudes and is sore and occasionally bleeds but not very often.  The colonoscopy in 2017 revealed 4 adenomas max 9 mm.  Diverticulosis in the sigmoid colon and mixed hemorrhoids.   Allergies  Allergen Reactions  . Sulfa Antibiotics Itching    Cannot remember what kind of reaction   Current Meds  Medication Sig  . acetaminophen (TYLENOL) 500 MG tablet Take 1,000 mg by mouth daily as needed for moderate pain or headache.  [name] atorvastatin (LIPITOR) 40 MG tablet Take 1 tablet (40 mg total) by mouth daily.  . cetirizine (ZYRTEC) 10 MG tablet Take 10 mg by mouth as needed for allergies.   . cyclobenzaprine (FLEXERIL) 10 MG tablet TAKE 1/2 TO 1 TABLET THREE TIMES DAILY AS NEEDED FOR MUSCLE RELAXATION  .  fluticasone (FLONASE) 50 MCG/ACT nasal spray Place 2 sprays into both nostrils daily.  [name] ipratropium (ATROVENT) 0.06 % nasal spray USE 2 SPRAYS IN EACH NOSTRIL THREE TIMES DAILY  . metoprolol tartrate (LOPRESSOR) 25 MG tablet Take 1 tablet (25 mg total) by mouth 2 (two) times daily.  . Omega-3 Fatty Acids (FISH OIL PO) Take 360 mg by mouth every evening.   . triamcinolone ointment (TRIDERM) 0.1 % Apply 1 application topically 2 (two) times daily as needed (eczema).   . warfarin (COUMADIN) 5 MG tablet Takes 5mg  on Sun, Tue, Thu, Frida and Sat. 7.5mg  on Mon and QWed.   Past Medical History:  Diagnosis Date  . Adenomatous colon polyp   . Allergic rhinitis    primarily seasonal  . Atrial fibrillation ([place])    peri op afib initially, then dx'd with permanent a-fib 2014 (xarelto started, switched to coumadin for cost reasons)  . Borderline diabetes 05/2015; 03/2017   2016 A1c 5.8%.  2018 A1c 6.0%.  12/2017 A1c 5.9%. 08/2019 a1c 6.0%. Fasting gluc > 126 once and then 125 on f/u (08/2019).  [name] CAD (coronary artery disease)    CABG 2011  . Chronic idiopathic thrombocytopenia (HCC)    very mild  . Diverticulosis 2012 scope   pancolonic  . GERD (gastroesophageal reflux disease)   . Hearing impairment    [place]; has hearing aids  . Hemorrhoids    GI referred pt to Dr. Arnoldo Morale 03/2018  . Hypercholesteremia   . Hypertension   . Pneumonia   .  Ulcerative esophagitis    Past Surgical History:  Procedure Laterality Date  . CARDIOVASCULAR STRESS TEST  12/2012   Nuclear stress (exercise) testing: low risk scan  . CATARACT EXTRACTION    . COLONOSCOPY N/A 07/22/2016   Procedure: COLONOSCOPY;  Surgeon: Rogene Houston, MD;  [facility];  Service: Endoscopy;  Laterality: N/A;  1:55  . COLONOSCOPY W/ POLYPECTOMY  X 4, most recent 07/22/16   2017: tubular adenoma x 2 (recall 5 yrs--? age ?)  . CORONARY ARTERY BYPASS GRAFT  2011   L - LAD, S - diag, S - PDA/LV branch 2011.  Nl EF  . INGUINAL HERNIA  REPAIR  1970s   right groin  . POLYPECTOMY  07/22/2016   Procedure: POLYPECTOMY;  Surgeon: Rogene Houston, MD;  [facility];  Service: Endoscopy;;  hepatic flexure,  sigmoid colon,   . TONSILLECTOMY AND ADENOIDECTOMY  as a child  . UPPER GI ENDOSCOPY  09/03/2010   hiatus hernia, no Barrett's esoph,esoph stricture dilated, gastric ulcers (h pylori neg)   Social History   Social History Narrative   Married, 5 children, 10 grandchildren.   Relocated to [place] from [place] in 2012 for warmer climate.  Orig from [place], [place].   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   Hobby: woodworking.   No T/A/Ds.   Exercise: treadmill and stationary bike--issue with knee temporarily got him out of routine--set to restart 03/2013.   Diet: prudent diet but nothing drastic.         family history includes Coronary artery disease (age of onset: 4) in his brother; Diabetes in his brother and father; Pneumonia in his father; Stroke in his mother.   Review of Systems As per HPI  Objective:   Physical Exam BP 110/68   Pulse 72   Ht 5\' 9"  (1.753 m)   Wt 169 lb (76.7 kg)   BMI 24.96 kg/m    Rectal  Small external hemorrhoid seen RP, no internal prolapse with Valsalva  DRE NL, nontender no fissure no mass, mild stenosis in the anal canal and I can palpate a hemorrhoid complex in the right posterior aspect  Anoscopy - Gr 3 RP int hemorrhoid with external component Gr 1-2 LL RA  PROCEDURE NOTE: The patient presents with symptomatic grade 3 hemorrhoids, requesting rubber band ligation of his/her hemorrhoidal disease.  All risks, benefits and alternative forms of therapy were described and informed consent was obtained.   The anorectum was pre-medicated with 0.125% nitroglycerin and 5% lidocaine The decision was made to band the right posterior internal hemorrhoid, and the Roe was used to perform band ligation without complication.  Digital anorectal  examination was then performed to assure proper positioning of the band, and to adjust the banded tissue as required.  The patient was discharged home without pain or other issues.  Dietary and behavioral recommendations were given and along with follow-up instructions.

## 2019-12-18 NOTE — Assessment & Plan Note (Signed)
Right posterior banded return to clinic 1 month suspect this will need repeat banding at least 1 more time

## 2019-12-18 NOTE — Patient Instructions (Addendum)
HEMORRHOID BANDING PROCEDURE    FOLLOW-UP CARE   1. The procedure you have had should have been relatively painless since the banding of the area involved does not have nerve endings and there is no pain sensation.  The rubber band cuts off the blood supply to the hemorrhoid and the band may fall off as soon as 48 hours after the banding (the band may occasionally be seen in the toilet bowl following a bowel movement). You may notice a temporary feeling of fullness in the rectum which should respond adequately to plain Tylenol or Motrin.  2. Following the banding, avoid strenuous exercise that evening and resume full activity the next day.  A sitz bath (soaking in a warm tub) or bidet is soothing, and can be useful for cleansing the area after bowel movements.     3. To avoid constipation, take two tablespoons of natural wheat bran, natural oat bran, flax, Benefiber or any over the counter fiber supplement and increase your water intake to 7-8 glasses daily.    4. Unless you have been prescribed anorectal medication, do not put anything inside your rectum for two weeks: No suppositories, enemas, fingers, etc.  5. Occasionally, you may have more bleeding than usual after the banding procedure.  This is often from the untreated hemorrhoids rather than the treated one.  Don't be concerned if there is a tablespoon or so of blood.  If there is more blood than this, lie flat with your bottom higher than your head and apply an ice pack to the area. If the bleeding does not stop within a half an hour or if you feel faint, call our office at [phone] or go to the emergency room.  6. Problems are not common; however, if there is a substantial amount of bleeding, severe pain, chills, fever or difficulty passing urine (very rare) or other problems, you should call us at [phone] or report to the nearest emergency room.  7. Do not stay seated continuously for more than 2-3 hours for a day or two  after the procedure.  Tighten your buttock muscles 10-15 times every two hours and take 10-15 deep breaths every 1-2 hours.  Do not spend more than a few minutes on the toilet if you cannot empty your bowel; instead re-visit the toilet at a later time.    Use a tablespoon of Benefiber nightly.   We will see you June 15 th at 2:50pm for additional banding if needed.    I appreciate the opportunity to care for you. Silvano Rusk, MD, [facility]

## 2020-01-09 ENCOUNTER — Other Ambulatory Visit: Payer: Self-pay

## 2020-01-09 ENCOUNTER — Ambulatory Visit (INDEPENDENT_AMBULATORY_CARE_PROVIDER_SITE_OTHER): Payer: Medicare HMO | Admitting: Pharmacist

## 2020-01-09 DIAGNOSIS — I4819 Other persistent atrial fibrillation: Secondary | ICD-10-CM

## 2020-01-09 DIAGNOSIS — Z5181 Encounter for therapeutic drug level monitoring: Secondary | ICD-10-CM | POA: Diagnosis not present

## 2020-01-09 DIAGNOSIS — I4821 Permanent atrial fibrillation: Secondary | ICD-10-CM

## 2020-01-09 LAB — POCT INR: INR: 2.6 (ref 2.0–3.0)

## 2020-01-09 NOTE — Patient Instructions (Signed)
Description   Continue warfarin 1 tablet daily except 1 1/2 tablets on Mondays and Wednesdays Recheck in 6 weeks

## 2020-01-15 ENCOUNTER — Encounter: Payer: Self-pay | Admitting: Internal Medicine

## 2020-01-15 ENCOUNTER — Ambulatory Visit: Payer: Medicare HMO | Admitting: Internal Medicine

## 2020-01-15 DIAGNOSIS — K642 Third degree hemorrhoids: Secondary | ICD-10-CM | POA: Diagnosis not present

## 2020-01-15 NOTE — Patient Instructions (Signed)
HEMORRHOID BANDING PROCEDURE    FOLLOW-UP CARE   1. The procedure you have had should have been relatively painless since the banding of the area involved does not have nerve endings and there is no pain sensation.  The rubber band cuts off the blood supply to the hemorrhoid and the band may fall off as soon as 48 hours after the banding (the band may occasionally be seen in the toilet bowl following a bowel movement). You may notice a temporary feeling of fullness in the rectum which should respond adequately to plain Tylenol or Motrin.  2. Following the banding, avoid strenuous exercise that evening and resume full activity the next day.  A sitz bath (soaking in a warm tub) or bidet is soothing, and can be useful for cleansing the area after bowel movements.     3. To avoid constipation, take two tablespoons of natural wheat bran, natural oat bran, flax, Benefiber or any over the counter fiber supplement and increase your water intake to 7-8 glasses daily.    4. Unless you have been prescribed anorectal medication, do not put anything inside your rectum for two weeks: No suppositories, enemas, fingers, etc.  5. Occasionally, you may have more bleeding than usual after the banding procedure.  This is often from the untreated hemorrhoids rather than the treated one.  Don't be concerned if there is a tablespoon or so of blood.  If there is more blood than this, lie flat with your bottom higher than your head and apply an ice pack to the area. If the bleeding does not stop within a half an hour or if you feel faint, call our office at [phone] or go to the emergency room.  6. Problems are not common; however, if there is a substantial amount of bleeding, severe pain, chills, fever or difficulty passing urine (very rare) or other problems, you should call us at [phone] or report to the nearest emergency room.  7. Do not stay seated continuously for more than 2-3 hours for a day or two  after the procedure.  Tighten your buttock muscles 10-15 times every two hours and take 10-15 deep breaths every 1-2 hours.  Do not spend more than a few minutes on the toilet if you cannot empty your bowel; instead re-visit the toilet at a later time.    Follow up with Dr Gessner in a month.   I appreciate the opportunity to care for you. Carl Gessner, MD, FACG 

## 2020-01-15 NOTE — Assessment & Plan Note (Addendum)
R P banded x 2 The patient will return in 1 month for  follow-up and possible additional banding as required. No complications were encountered and the patient tolerated the procedure well.

## 2020-01-15 NOTE — Progress Notes (Signed)
    Hemorrhoid ligation visit  The patient returns reporting less symptoms with his grade 3 right posterior internal hemorrhoid but it does prolapse some. RP was ligated 1 month ago  Rectal - slide external bulger RP, NL DRE - no overt prolapse with Valsalva  Anoscopy - Gr 2 RP internal hemorrhoid  PROCEDURE NOTE: The patient presents with symptomatic grade 3  hemorrhoids, requesting rubber band ligation of his/her hemorrhoidal disease.  All risks, benefits and alternative forms of therapy were described and informed consent was obtained.   The anorectum was pre-medicated with 0.125% NTG and 5% lidocaine The decision was made to band the RP internal hemorrhoid, and the [place] was used to perform band ligation without complication. 2 bands placed Digital anorectal examination was then performed to assure proper positioning of the band, and to adjust the banded tissue as required.  The patient was discharged home without pain or other issues.  Dietary and behavioral recommendations were given and along with follow-up instructions.      The patient will return in 1 month for  follow-up and possible additional banding as required. No complications were encountered and the patient tolerated the procedure well.

## 2020-02-14 ENCOUNTER — Ambulatory Visit (INDEPENDENT_AMBULATORY_CARE_PROVIDER_SITE_OTHER): Payer: Medicare HMO | Admitting: *Deleted

## 2020-02-14 DIAGNOSIS — I4819 Other persistent atrial fibrillation: Secondary | ICD-10-CM | POA: Diagnosis not present

## 2020-02-14 DIAGNOSIS — Z5181 Encounter for therapeutic drug level monitoring: Secondary | ICD-10-CM | POA: Diagnosis not present

## 2020-02-14 DIAGNOSIS — I4821 Permanent atrial fibrillation: Secondary | ICD-10-CM | POA: Diagnosis not present

## 2020-02-14 LAB — POCT INR: INR: 2.2 (ref 2.0–3.0)

## 2020-02-14 NOTE — Patient Instructions (Signed)
Continue warfarin 1 tablet daily except 1 1/2 tablets on Mondays and Wednesdays Recheck in 6 weeks 

## 2020-02-28 ENCOUNTER — Encounter: Payer: Self-pay | Admitting: Family Medicine

## 2020-03-03 ENCOUNTER — Ambulatory Visit: Payer: Medicare HMO | Admitting: Internal Medicine

## 2020-03-03 ENCOUNTER — Encounter: Payer: Self-pay | Admitting: Internal Medicine

## 2020-03-03 VITALS — BP 120/70 | HR 75 | Ht 67.5 in | Wt 167.0 lb

## 2020-03-03 DIAGNOSIS — K642 Third degree hemorrhoids: Secondary | ICD-10-CM | POA: Diagnosis not present

## 2020-03-03 NOTE — Patient Instructions (Signed)
HEMORRHOID BANDING PROCEDURE    FOLLOW-UP CARE   1. The procedure you have had should have been relatively painless since the banding of the area involved does not have nerve endings and there is no pain sensation.  The rubber band cuts off the blood supply to the hemorrhoid and the band may fall off as soon as 48 hours after the banding (the band may occasionally be seen in the toilet bowl following a bowel movement). You may notice a temporary feeling of fullness in the rectum which should respond adequately to plain Tylenol or Motrin.  2. Following the banding, avoid strenuous exercise that evening and resume full activity the next day.  A sitz bath (soaking in a warm tub) or bidet is soothing, and can be useful for cleansing the area after bowel movements.     3. To avoid constipation, take two tablespoons of natural wheat bran, natural oat bran, flax, Benefiber or any over the counter fiber supplement and increase your water intake to 7-8 glasses daily.    4. Unless you have been prescribed anorectal medication, do not put anything inside your rectum for two weeks: No suppositories, enemas, fingers, etc.  5. Occasionally, you may have more bleeding than usual after the banding procedure.  This is often from the untreated hemorrhoids rather than the treated one.  Don't be concerned if there is a tablespoon or so of blood.  If there is more blood than this, lie flat with your bottom higher than your head and apply an ice pack to the area. If the bleeding does not stop within a half an hour or if you feel faint, call our office at [phone] or go to the emergency room.  6. Problems are not common; however, if there is a substantial amount of bleeding, severe pain, chills, fever or difficulty passing urine (very rare) or other problems, you should call us at [phone] or report to the nearest emergency room.  7. Do not stay seated continuously for more than 2-3 hours for a day or two  after the procedure.  Tighten your buttock muscles 10-15 times every two hours and take 10-15 deep breaths every 1-2 hours.  Do not spend more than a few minutes on the toilet if you cannot empty your bowel; instead re-visit the toilet at a later time.    Follow up with Dr Gessner as needed.    I appreciate the opportunity to care for you. Carl Gessner, MD, FACG 

## 2020-03-03 NOTE — Progress Notes (Signed)
         Patient is here for follow-up, he has had a grade 3 prolapsed internal hemorrhoid with an external component, it has been banded in May and June.  It is significantly improved.  He did have some bleeding when he was constipated when he was traveling to his second home in [place] recently.  Otherwise he has been well though the hemorrhoid is protruding still i.e. prolapsed.  He is interested in additional treatment if possible.  Rectal exam demonstrates a right posterior prolapsed internal/external complex.  Manually reducible.  Grade 3.  Anoscopic exam confirms that finding of a grade 3 internal and external complex.  No stigmata of bleeding at this time.  There are some small surrounding external soft tags as well.   PROCEDURE NOTE: The patient presents with symptomatic grade 3  hemorrhoids, requesting rubber band ligation of his/her hemorrhoidal disease.  All risks, benefits and alternative forms of therapy were described and informed consent was obtained.   The anorectum was pre-medicated with 0.125% nitroglycerin and 5% lidocaine The decision was made to band the RP internal hemorrhoid, and the [place] was used to perform band ligation without complication.   2 bands were placed in this region.  There was a good result and no prolapse. Digital anorectal examination was then performed to assure proper positioning of the band, and to adjust the banded tissue as required.  The patient was discharged home without pain or other issues.  Dietary and behavioral recommendations were given and along with follow-up instructions.     The following adjunctive treatments were recommended:  Avoid constipation, keep stools soft and easy defecation.  The patient will return as needed for  follow-up and possible additional banding as required. No complications were encountered and the patient tolerated the procedure well.

## 2020-03-07 ENCOUNTER — Encounter: Payer: Self-pay | Admitting: Family Medicine

## 2020-03-07 ENCOUNTER — Other Ambulatory Visit: Payer: Self-pay

## 2020-03-07 ENCOUNTER — Ambulatory Visit (INDEPENDENT_AMBULATORY_CARE_PROVIDER_SITE_OTHER): Payer: Medicare HMO | Admitting: Family Medicine

## 2020-03-07 VITALS — BP 124/70 | HR 67 | Temp 98.1°F | Ht 67.5 in | Wt 167.8 lb

## 2020-03-07 DIAGNOSIS — M545 Low back pain, unspecified: Secondary | ICD-10-CM

## 2020-03-07 NOTE — Progress Notes (Signed)
OFFICE VISIT  03/07/2020   CC:  Chief Complaint  Patient presents with  . Back Pain    left lower back and pain travels to hip x 1 week   HPI:    Patient is a 86 y.o. Caucasian male with permanent a-fib, HTN, HLD, and hx of CAD (s/p cabg) who presents for back pain.  Onset 1 wk ago, similar to episodes in the past.  No known preceding incident but has been working in the yard as usual.  Located focally in L low back, goes into L glut some but nothing into groin, hip, or down leg.  No paresthesias or weakness.  Pressing on the area makes it feel better. Trying TENS unit, biofreeze, stretching, hot shower, takes flexeril as well. Otherwise feeling well.  Past Medical History:  Diagnosis Date  . Adenomatous colon polyp   . Allergic rhinitis    primarily seasonal  . Atrial fibrillation ([place])    peri op afib initially, then dx'd with permanent a-fib 2014 (xarelto started, switched to coumadin for cost reasons)  . Borderline diabetes 05/2015; 03/2017   2016 A1c 5.8%.  2018 A1c 6.0%.  12/2017 A1c 5.9%. 08/2019 a1c 6.0%. Fasting gluc > 126 once and then 125 on f/u (08/2019).  [name] CAD (coronary artery disease)    CABG 2011  . Chronic idiopathic thrombocytopenia (HCC)    very mild  . Diverticulosis 2012 scope   pancolonic  . GERD (gastroesophageal reflux disease)   . Hearing impairment    AU; has hearing aids  . Hemorrhoids    banding 12/2019  . Hypercholesteremia   . Hypertension   . Pneumonia   . Ulcerative esophagitis     Past Surgical History:  Procedure Laterality Date  . CARDIOVASCULAR STRESS TEST  12/2012   Nuclear stress (exercise) testing: low risk scan  . CATARACT EXTRACTION    . COLONOSCOPY N/A 07/22/2016   Procedure: COLONOSCOPY;  Surgeon: Rogene Houston, MD;  [facility];  Service: Endoscopy;  Laterality: N/A;  1:55  . COLONOSCOPY W/ POLYPECTOMY  X 4, most recent 07/22/16   2017: tubular adenoma x 2 (recall 5 yrs--? age ?)  . CORONARY ARTERY BYPASS GRAFT   2011   L - LAD, S - diag, S - PDA/LV branch 2011.  Nl EF  . HEMORRHOID BANDING  12/2019  . INGUINAL HERNIA REPAIR  1970s   right groin  . POLYPECTOMY  07/22/2016   Procedure: POLYPECTOMY;  Surgeon: Rogene Houston, MD;  [facility];  Service: Endoscopy;;  hepatic flexure,  sigmoid colon,   . TONSILLECTOMY AND ADENOIDECTOMY  as a child  . UPPER GI ENDOSCOPY  09/03/2010   hiatus hernia, no Barrett's esoph,esoph stricture dilated, gastric ulcers (h pylori neg)    Outpatient Medications Prior to Visit  Medication Sig Dispense Refill  . acetaminophen (TYLENOL) 500 MG tablet Take 1,000 mg by mouth daily as needed for moderate pain or headache.    [name] atorvastatin (LIPITOR) 40 MG tablet Take 1 tablet (40 mg total) by mouth daily. 90 tablet 3  . cetirizine (ZYRTEC) 10 MG tablet Take 10 mg by mouth as needed for allergies.     . cyclobenzaprine (FLEXERIL) 10 MG tablet TAKE 1/2 TO 1 TABLET THREE TIMES DAILY AS NEEDED FOR MUSCLE RELAXATION 270 tablet 3  . fluticasone (FLONASE) 50 MCG/ACT nasal spray Place 2 sprays into both nostrils daily.    [name] ipratropium (ATROVENT) 0.06 % nasal spray USE 2 SPRAYS IN EACH NOSTRIL  THREE TIMES DAILY 105 mL 3  . metoprolol tartrate (LOPRESSOR) 25 MG tablet Take 1 tablet (25 mg total) by mouth 2 (two) times daily. 180 tablet 3  . Omega-3 Fatty Acids (FISH OIL PO) Take 360 mg by mouth every evening.     . triamcinolone ointment (TRIDERM) 0.1 % Apply 1 application topically 2 (two) times daily as needed (eczema).     . warfarin (COUMADIN) 5 MG tablet Takes 5mg  on Sun, Tue, Thu, Frida and Sat. 7.5mg  on Mon and QWed. 180 tablet 3   No facility-administered medications prior to visit.    Allergies  Allergen Reactions  . Sulfa Antibiotics Itching    Cannot remember what kind of reaction    ROS As per HPI  PE: Vitals with BMI 03/07/2020 03/03/2020 01/15/2020  Height 5' 7.5" 5' 7.5" 5\' 7"   Weight 167 lbs 13 oz 167 lbs 162 lbs 2 oz  BMI 25.88 22.02 54.27   Systolic 062 376 283  Diastolic 70 70 74  Pulse 67 75 102  O2 sat on RA is 98%  Gen: Alert, well appearing.  Patient is oriented to person, place, time, and situation. AFFECT: pleasant, lucid thought and speech. ROM intact. Focal TTP in L lumbar tenderness in soft tissues --a bit lateral to the facet joints region, w/out palpable mass or spasm. No TTP of ischial tuberosity or glut muscle.  No hip TTP.  Hips ROM normal. FABER does not elicit any pain, although all mm's tight. SLR neg bilat.  LEGS strength 5/5 prox/dist bilat.  LABS:  Lab Results  Component Value Date   TSH 1.99 05/26/2015   Lab Results  Component Value Date   WBC 6.9 08/10/2019   HGB 17.0 08/10/2019   HCT 49.8 08/10/2019   MCV 90.7 08/10/2019   PLT 149 08/10/2019   Lab Results  Component Value Date   CREATININE 1.09 08/10/2019   BUN 14 08/10/2019   NA 141 08/10/2019   K 4.4 08/10/2019   CL 105 08/10/2019   CO2 26 08/10/2019   Lab Results  Component Value Date   ALT 22 08/10/2019   AST 20 08/10/2019   ALKPHOS 71 04/16/2019   BILITOT 1.0 08/10/2019   Lab Results  Component Value Date   CHOL 139 08/10/2019   Lab Results  Component Value Date   HDL 42 08/10/2019   Lab Results  Component Value Date   LDLCALC 79 08/10/2019   Lab Results  Component Value Date   TRIG 99 08/10/2019   Lab Results  Component Value Date   CHOLHDL 3.3 08/10/2019   Lab Results  Component Value Date   PSA 1.43 05/26/2015   PSA 1.01 03/20/2013   Lab Results  Component Value Date   HGBA1C 6.0 (H) 08/10/2019   IMPRESSION AND PLAN:  Musculoskeletal LBP (lumbar; doubt SI jt dysfunction). Given his age will check lumbar plain films. Continue with current conservative measures: heat/ice, biofreeze, stretching, TENs, massage, flexeril. No NSAIDs due to being on coumadin. If not any better in 1 wk will ask Dr. Nelva Bush with [facility] to see him.   An After Visit Summary was printed and given to the  patient.  FOLLOW UP: No follow-ups on file.  Signed:  Crissie Sickles, MD           03/07/2020

## 2020-03-11 ENCOUNTER — Other Ambulatory Visit: Payer: Self-pay

## 2020-03-11 ENCOUNTER — Ambulatory Visit (INDEPENDENT_AMBULATORY_CARE_PROVIDER_SITE_OTHER)
Admission: RE | Admit: 2020-03-11 | Discharge: 2020-03-11 | Disposition: A | Discharge status: Home / Self Care | Payer: Medicare HMO | Source: Ambulatory Visit | Attending: Family Medicine | Admitting: Family Medicine

## 2020-03-11 DIAGNOSIS — M545 Low back pain, unspecified: Secondary | ICD-10-CM

## 2020-03-12 ENCOUNTER — Telehealth: Payer: Self-pay | Admitting: Family Medicine

## 2020-03-12 DIAGNOSIS — M545 Low back pain, unspecified: Secondary | ICD-10-CM

## 2020-03-12 NOTE — Telephone Encounter (Signed)
Spoke with patient and he would like to proceed with referral to Dr.Ramos.

## 2020-03-12 NOTE — Telephone Encounter (Signed)
Patient was advised of imaging results. Any suggestions ?

## 2020-03-12 NOTE — Telephone Encounter (Signed)
OK, referral ordered 

## 2020-03-12 NOTE — Telephone Encounter (Signed)
Patient is calling in stating that the results from his x-ray has come back and would like to know what Dr.McGowen would suggest.

## 2020-03-12 NOTE — Telephone Encounter (Signed)
If back pain is unchanged with the current home management things we discussed then I will refer him to Dr. Nelva Bush at [facility]. Let me know if he's ok with this and then I'll order referral. -thx

## 2020-03-12 NOTE — Telephone Encounter (Signed)
Patient was advised he should receive a call with scheduling details from [facility] office.

## 2020-03-12 NOTE — Addendum Note (Signed)
Addended by: Tammi Sou on: 03/12/2020 04:45 PM   Modules accepted: Orders

## 2020-03-20 ENCOUNTER — Telehealth: Payer: Self-pay | Admitting: Internal Medicine

## 2020-03-20 NOTE — Telephone Encounter (Signed)
Attempted to return call x 2.  Phone picks up, but is just static air.  Will try again tomorrow

## 2020-03-20 NOTE — Telephone Encounter (Signed)
Pt states that he has been dealing with internal hemorrhoids again. He would like to know if he needs another banding or what he can do.

## 2020-03-21 NOTE — Telephone Encounter (Signed)
Attempted to reach the patient again.  Phone picks up, but static air. Will await a return call from the patient

## 2020-03-24 DIAGNOSIS — Z961 Presence of intraocular lens: Secondary | ICD-10-CM | POA: Diagnosis not present

## 2020-03-24 DIAGNOSIS — H35371 Puckering of macula, right eye: Secondary | ICD-10-CM | POA: Diagnosis not present

## 2020-03-24 DIAGNOSIS — H5213 Myopia, bilateral: Secondary | ICD-10-CM | POA: Diagnosis not present

## 2020-03-27 ENCOUNTER — Ambulatory Visit (INDEPENDENT_AMBULATORY_CARE_PROVIDER_SITE_OTHER): Payer: Medicare HMO | Admitting: *Deleted

## 2020-03-27 DIAGNOSIS — Z5181 Encounter for therapeutic drug level monitoring: Secondary | ICD-10-CM | POA: Diagnosis not present

## 2020-03-27 DIAGNOSIS — I4821 Permanent atrial fibrillation: Secondary | ICD-10-CM | POA: Diagnosis not present

## 2020-03-27 DIAGNOSIS — I4819 Other persistent atrial fibrillation: Secondary | ICD-10-CM

## 2020-03-27 LAB — POCT INR: INR: 2.4 (ref 2.0–3.0)

## 2020-03-27 NOTE — Patient Instructions (Signed)
Continue warfarin 1 tablet daily except 1 1/2 tablets on Mondays and Wednesdays Recheck in 6 weeks 

## 2020-03-28 DIAGNOSIS — M5136 Other intervertebral disc degeneration, lumbar region: Secondary | ICD-10-CM | POA: Diagnosis not present

## 2020-04-03 ENCOUNTER — Encounter: Payer: Self-pay | Admitting: Physical Therapy

## 2020-04-03 ENCOUNTER — Other Ambulatory Visit: Payer: Self-pay

## 2020-04-03 ENCOUNTER — Ambulatory Visit: Payer: Medicare HMO | Attending: Chiropractic Medicine | Admitting: Physical Therapy

## 2020-04-03 DIAGNOSIS — G8929 Other chronic pain: Secondary | ICD-10-CM

## 2020-04-03 DIAGNOSIS — M545 Low back pain: Secondary | ICD-10-CM | POA: Diagnosis not present

## 2020-04-03 DIAGNOSIS — R293 Abnormal posture: Secondary | ICD-10-CM | POA: Diagnosis not present

## 2020-04-03 NOTE — Therapy (Signed)
[facility] [place], [place], [address] Phone: [phone]   Fax:  [phone]  Physical Therapy Evaluation  Patient Details  Name: Angel GOLDSTON Sr. MRN: [id number] Date of Birth: 19-Oct-1933 Referring Provider (PT): Levy Pupa PA-C   Encounter Date: 04/03/2020   PT End of Session - 04/03/20 1413    Visit Number 1    Number of Visits 12    Date for PT Re-Evaluation 05/22/20    PT Start Time 0100    PT Stop Time 0152    PT Time Calculation (min) 52 min    Activity Tolerance Patient tolerated treatment well    Behavior During Therapy [facility] for tasks assessed/performed           Past Medical History:  Diagnosis Date   Adenomatous colon polyp    Allergic rhinitis    primarily seasonal   Atrial fibrillation ([place])    peri op afib initially, then dx'd with permanent a-fib 2014 (xarelto started, switched to coumadin for cost reasons)   Borderline diabetes 05/2015; 03/2017   2016 A1c 5.8%.  2018 A1c 6.0%.  12/2017 A1c 5.9%. 08/2019 a1c 6.0%. Fasting gluc > 126 once and then 125 on f/u (08/2019).   CAD (coronary artery disease)    CABG 2011   Chronic idiopathic thrombocytopenia (HCC)    very mild   Diverticulosis 2012 scope   pancolonic   GERD (gastroesophageal reflux disease)    Hearing impairment    AU; has hearing aids   Hemorrhoids    banding 12/2019   Hypercholesteremia    Hypertension    Pneumonia    Ulcerative esophagitis     Past Surgical History:  Procedure Laterality Date   CARDIOVASCULAR STRESS TEST  12/2012   Nuclear stress (exercise) testing: low risk scan   CATARACT EXTRACTION     COLONOSCOPY N/A 07/22/2016   Procedure: COLONOSCOPY;  Surgeon: Rogene Houston, MD;  [facility];  Service: Endoscopy;  Laterality: N/A;  1:55   COLONOSCOPY W/ POLYPECTOMY  X 4, most recent 07/22/16   2017: tubular adenoma x 2 (recall 5 yrs--? age ?)   CORONARY ARTERY BYPASS GRAFT  2011    L - LAD, S - diag, S - PDA/LV branch 2011.  Nl EF   HEMORRHOID BANDING  12/2019   INGUINAL HERNIA REPAIR  1970s   right groin   POLYPECTOMY  07/22/2016   Procedure: POLYPECTOMY;  Surgeon: Rogene Houston, MD;  [facility];  Service: Endoscopy;;  hepatic flexure,  sigmoid colon,    TONSILLECTOMY AND ADENOIDECTOMY  as a child   UPPER GI ENDOSCOPY  09/03/2010   hiatus hernia, no Barrett's esoph,esoph stricture dilated, gastric ulcers (h pylori neg)    There were no vitals filed for this visit.    Subjective Assessment - 04/03/20 1337    Subjective COVID-19 screen performed prior to patient entering clinic.  The patient presents to the clinic today witha 52 year h/o of left sided low back pain related to lifting a very heavy rock from the bed of a truck.  He has has occasions in which the pain is excuriating.  His pain is an 8/10 today.  There are times when he experiences pain radiation into his left buttock.  Bending and turning increase his pain.  Pushing into an area of his back with his fingers decreases his pain.    Pertinent History CAD, CABG, Inguinal hernia repair.    Patient Stated Goals Decrease  pain.    Currently in Pain? Yes    Pain Score 8     Pain Location Back    Pain Orientation Left    Pain Descriptors / Indicators Sharp    Pain Type Chronic pain    Pain Onset More than a month ago    Pain Frequency Constant    Aggravating Factors  See above.    Pain Relieving Factors See above.              [facility] PT Assessment - 04/03/20 0001      Assessment   Medical Diagnosis Degeneration of lumbar intervertebral disc.    Referring Provider (PT) Levy Pupa PA-C    Onset Date/Surgical Date --   26 years+     Precautions   Precautions None      Restrictions   Weight Bearing Restrictions No      Balance Screen   Has the patient fallen in the past 6 months No    Has the patient had a decrease in activity level because of a fear of falling?  No    Is the  patient reluctant to leave their home because of a fear of falling?  No      Home Environment   Living Environment Private residence      Prior Function   Level of Independence Independent      Posture/Postural Control   Posture/Postural Control Postural limitations    Postural Limitations Rounded Shoulders;Forward head;Decreased lumbar lordosis    Posture Comments Left iliac creast lower than right and possible lumbar scoliosis with convexity of left.      Deep Tendon Reflexes   DTR Assessment Site Patella;Achilles    Patella DTR 2+    Achilles DTR 2+      ROM / Strength   AROM / PROM / Strength AROM;Strength      AROM   Overall AROM Comments Lumbar intervertbral movement into flexion limited by 75% and lumbar extension limited to 10 degrees.      Strength   Overall Strength Comments Normal LE strength.      Palpation   Palpation comment Tender to palpation left lumbar erector spinae and QL with a notable increase in muscle tone.      Special Tests   Other special tests Equal leg lengths, Pain into left buttock with left SLR, (-) FABER test.                      Objective measurements completed on examination: See above findings.       [facility] Adult PT Treatment/Exercise - 04/03/20 0001      Transfers   Comments --      Ambulation/Gait   Gait Comments Patient walks in some trunk flexion.                       PT Long Term Goals - 04/03/20 1453      PT LONG TERM GOAL #1   Title Independent with a HEP.    Period Weeks    Status New      PT LONG TERM GOAL #2   Title Perform ADL's with LBP level not > 3-4/10.    Time 6    Period Weeks    Status New      PT LONG TERM GOAL #3   Title Eliminate radiation of pain into left buttock.    Time 6    Period Weeks  Status New                  Plan - 04/03/20 1424    Clinical Impression Statement The patient presents OPPT with a 41 year+ h/o left sided low back pain.  He has  postural deviations.  He has occasions of radiation of pain into his left buttock.  He reported increased pain with a left SLR.  He is tender to palpation in his left lumbar musculature.Patient will benefit from skilled physical therapy intervention to address deficits and pain.    Personal Factors and Comorbidities Comorbidity 1;Comorbidity 2    Comorbidities CAD, CABG, Inguinal hernia repair.    Examination-Activity Limitations Other    Examination-Participation Restrictions Other    Stability/Clinical Decision Making Evolving/Moderate complexity    Clinical Decision Making Moderate    Rehab Potential Good    PT Frequency 2x / week    PT Duration 6 weeks    PT Treatment/Interventions ADLs/Self Care Home Management;Cryotherapy;Electrical Stimulation;Ultrasound;Traction;Moist Heat;Therapeutic activities;Therapeutic exercise;Manual techniques;Passive range of motion;Dry needling;Spinal Manipulations    PT Next Visit Plan STW/M to left low back, Combo e'stim/US; Core exercise progression.    Consulted and Agree with Plan of Care Patient           Patient will benefit from skilled therapeutic intervention in order to improve the following deficits and impairments:  Abnormal gait, Pain, Increased muscle spasms, Postural dysfunction, Decreased activity tolerance, Decreased range of motion  Visit Diagnosis: Chronic left-sided low back pain without sciatica - Plan: PT plan of care cert/re-cert  Abnormal posture - Plan: PT plan of care cert/re-cert     Problem List Patient Active Problem List   Diagnosis Date Noted   Encounter for therapeutic drug monitoring 05/24/2019   Coronary artery disease involving native coronary artery of native heart without angina pectoris 04/01/2019   Spondylosis of cervical region without myelopathy or radiculopathy 07/10/2018   Neck pain 07/10/2018   Dyslipidemia 03/30/2017   History of colonic polyps 05/17/2016   Chronic anticoagulation - warfarin  Afib 12/04/2015   Prolapsed internal hemorrhoids, grade 3 12/04/2015   BPH (benign prostatic hypertrophy) 04/02/2014   Preventative health care 04/02/2014   Prostate cancer screening 04/02/2014   Vertebral artery syndrome 05/18/2013   Orthostatic hypotension 04/19/2013   Vertigo 04/19/2013   H/O dizziness 03/20/2013   Dizziness 03/06/2013   CAD (coronary artery disease) 05/12/2011   Atrial fibrillation ([place]) 05/12/2011   Hyperlipidemia 05/12/2011   Hypertension     Garcia Dalzell, [place] MPT 04/03/2020, 2:57 PM  [facility] [address] [place], [place], [address] Phone: [phone]   Fax:  [phone]  Name: Angel Lefeber Merrihew Sr. MRN: [id number] Date of Birth: 10/21/1933

## 2020-04-07 ENCOUNTER — Encounter: Payer: Self-pay | Admitting: Family Medicine

## 2020-04-10 ENCOUNTER — Other Ambulatory Visit: Payer: Self-pay

## 2020-04-10 ENCOUNTER — Ambulatory Visit: Payer: Medicare HMO | Admitting: Physical Therapy

## 2020-04-10 DIAGNOSIS — G8929 Other chronic pain: Secondary | ICD-10-CM

## 2020-04-10 DIAGNOSIS — R293 Abnormal posture: Secondary | ICD-10-CM

## 2020-04-10 DIAGNOSIS — M545 Low back pain: Secondary | ICD-10-CM | POA: Diagnosis not present

## 2020-04-10 NOTE — Therapy (Signed)
[facility] [place], [place], [address] Phone: [phone]   Fax:  [phone]  Physical Therapy Treatment  Patient Details  Name: Angel HOLQUIN Sr. MRN: [id number] Date of Birth: Feb 29, 1934 Referring Provider (PT): Levy Pupa PA-C   Encounter Date: 04/10/2020   PT End of Session - 04/10/20 1022    Visit Number 2    Number of Visits 12    Date for PT Re-Evaluation 05/22/20    PT Start Time 0947    PT Stop Time 1035    PT Time Calculation (min) 48 min    Activity Tolerance Patient tolerated treatment well    Behavior During Therapy [facility] for tasks assessed/performed           Past Medical History:  Diagnosis Date  . Adenomatous colon polyp   . Allergic rhinitis    primarily seasonal  . Atrial fibrillation ([place])    peri op afib initially, then dx'd with permanent a-fib 2014 (xarelto started, switched to coumadin for cost reasons)  . Borderline diabetes 05/2015; 03/2017   2016 A1c 5.8%.  2018 A1c 6.0%.  12/2017 A1c 5.9%. 08/2019 a1c 6.0%. Fasting gluc > 126 once and then 125 on f/u (08/2019).  [name] CAD (coronary artery disease)    CABG 2011  . Chronic idiopathic thrombocytopenia (HCC)    very mild  . Diverticulosis 2012 scope   pancolonic  . GERD (gastroesophageal reflux disease)   . Hearing impairment    AU; has hearing aids  . Hemorrhoids    banding 12/2019  . Hypercholesteremia   . Hypertension   . Pneumonia   . Recurrent low back pain    DDD.  PT per [place] ortho as of 03/2020  . Ulcerative esophagitis     Past Surgical History:  Procedure Laterality Date  . CARDIOVASCULAR STRESS TEST  12/2012   Nuclear stress (exercise) testing: low risk scan  . CATARACT EXTRACTION    . COLONOSCOPY N/A 07/22/2016   Procedure: COLONOSCOPY;  Surgeon: Rogene Houston, MD;  [facility];  Service: Endoscopy;  Laterality: N/A;  1:55  . COLONOSCOPY W/ POLYPECTOMY  X 4, most recent 07/22/16   2017: tubular adenoma x 2  (recall 5 yrs--? age ?)  . CORONARY ARTERY BYPASS GRAFT  2011   L - LAD, S - diag, S - PDA/LV branch 2011.  Nl EF  . HEMORRHOID BANDING  12/2019  . INGUINAL HERNIA REPAIR  1970s   right groin  . POLYPECTOMY  07/22/2016   Procedure: POLYPECTOMY;  Surgeon: Rogene Houston, MD;  [facility];  Service: Endoscopy;;  hepatic flexure,  sigmoid colon,   . TONSILLECTOMY AND ADENOIDECTOMY  as a child  . UPPER GI ENDOSCOPY  09/03/2010   hiatus hernia, no Barrett's esoph,esoph stricture dilated, gastric ulcers (h pylori neg)    There were no vitals filed for this visit.   Subjective Assessment - 04/10/20 1021    Subjective COVID-19 screen performed prior to patient entering clinic.  Left side better.  Feel more pain on right today.    Pertinent History CAD, CABG, Inguinal hernia repair.    Currently in Pain? Yes    Pain Score 6     Pain Location Back    Pain Orientation Left;Right    Pain Descriptors / Indicators Sharp;Aching    Pain Type Chronic pain    Pain Onset More than a month ago  [facility] Adult PT Treatment/Exercise - 04/10/20 0001      Exercises   Exercises Knee/Hip      Knee/Hip Exercises: Aerobic   Nustep Level 3 x 10 minutes.      Acupuncturist Location Bilateral low back.    Electrical Stimulation Action IFC    Electrical Stimulation Parameters 80-150 Hz x 20 minutes.    Electrical Stimulation Goals Tone;Pain      Manual Therapy   Manual Therapy Soft tissue mobilization    Soft tissue mobilization Prone over pillow:  STW/M and TP release technique to bilateral lumbar musculature including QL release technique x 13 minutes.                       PT Long Term Goals - 04/03/20 1453      PT LONG TERM GOAL #1   Title Independent with a HEP.    Period Weeks    Status New      PT LONG TERM GOAL #2   Title Perform ADL's with LBP level not > 3-4/10.    Time 6    Period  Weeks    Status New      PT LONG TERM GOAL #3   Title Eliminate radiation of pain into left buttock.    Time 6    Period Weeks    Status New                 Plan - 04/10/20 1027    Clinical Impression Statement Patient feeling more right sided low back pain and less on left today.  He did well with STW/M trigger point release technique to affected lumbar musculature.    Personal Factors and Comorbidities Comorbidity 1;Comorbidity 2    Comorbidities CAD, CABG, Inguinal hernia repair.    Examination-Activity Limitations Other    Stability/Clinical Decision Making Evolving/Moderate complexity    Rehab Potential Good    PT Frequency 2x / week    PT Duration 6 weeks    PT Treatment/Interventions ADLs/Self Care Home Management;Cryotherapy;Electrical Stimulation;Ultrasound;Traction;Moist Heat;Therapeutic activities;Therapeutic exercise;Manual techniques;Passive range of motion;Dry needling;Spinal Manipulations    PT Next Visit Plan STW/M to left low back, Combo e'stim/US; Core exercise progression.    Consulted and Agree with Plan of Care Patient           Patient will benefit from skilled therapeutic intervention in order to improve the following deficits and impairments:  Abnormal gait, Pain, Increased muscle spasms, Postural dysfunction, Decreased activity tolerance, Decreased range of motion  Visit Diagnosis: Chronic left-sided low back pain without sciatica  Abnormal posture     Problem List Patient Active Problem List   Diagnosis Date Noted  . Encounter for therapeutic drug monitoring 05/24/2019  . Coronary artery disease involving native coronary artery of native heart without angina pectoris 04/01/2019  . Spondylosis of cervical region without myelopathy or radiculopathy 07/10/2018  . Neck pain 07/10/2018  . Dyslipidemia 03/30/2017  . History of colonic polyps 05/17/2016  . Chronic anticoagulation - warfarin Afib 12/04/2015  . Prolapsed internal hemorrhoids,  grade 3 12/04/2015  . BPH (benign prostatic hypertrophy) 04/02/2014  . Preventative health care 04/02/2014  . Prostate cancer screening 04/02/2014  . Vertebral artery syndrome 05/18/2013  . Orthostatic hypotension 04/19/2013  . Vertigo 04/19/2013  . H/O dizziness 03/20/2013  . Dizziness 03/06/2013  . CAD (coronary artery disease) 05/12/2011  . Atrial fibrillation ([place]) 05/12/2011  . Hyperlipidemia 05/12/2011  . Hypertension  Ovid Witman, [place] MPT 04/10/2020, 11:04 AM  [facility] [address] [place], [place], [address] Phone: [phone]   Fax:  [phone]  Name: Angel FEIL Sr. MRN: [id number] Date of Birth: 1934-07-10

## 2020-04-17 ENCOUNTER — Encounter: Payer: Self-pay | Admitting: Physical Therapy

## 2020-04-17 ENCOUNTER — Ambulatory Visit: Payer: Medicare HMO | Admitting: Physical Therapy

## 2020-04-17 ENCOUNTER — Other Ambulatory Visit: Payer: Self-pay

## 2020-04-17 DIAGNOSIS — R293 Abnormal posture: Secondary | ICD-10-CM

## 2020-04-17 DIAGNOSIS — M545 Low back pain, unspecified: Secondary | ICD-10-CM

## 2020-04-17 DIAGNOSIS — G8929 Other chronic pain: Secondary | ICD-10-CM | POA: Diagnosis not present

## 2020-04-17 NOTE — Therapy (Signed)
[facility] [place], [place], [address] Phone: [phone]   Fax:  [phone]  Physical Therapy Treatment  Patient Details  Name: Angel RATHGEBER Sr. MRN: [id number] Date of Birth: [birth year] Referring Provider (PT): Levy Pupa PA-C   Encounter Date: 04/17/2020   PT End of Session - 04/17/20 0920    Visit Number 3    Number of Visits 12    Date for PT Re-Evaluation 05/22/20    PT Start Time 0815    PT Stop Time 0902    PT Time Calculation (min) 47 min    Activity Tolerance Patient tolerated treatment well    Behavior During Therapy [facility] for tasks assessed/performed           Past Medical History:  Diagnosis Date  . Adenomatous colon polyp   . Allergic rhinitis    primarily seasonal  . Atrial fibrillation ([place])    peri op afib initially, then dx'd with permanent a-fib 2014 (xarelto started, switched to coumadin for cost reasons)  . Borderline diabetes 05/2015; 03/2017   2016 A1c 5.8%.  2018 A1c 6.0%.  12/2017 A1c 5.9%. 08/2019 a1c 6.0%. Fasting gluc > 126 once and then 125 on f/u (08/2019).  [name] CAD (coronary artery disease)    CABG 2011  . Chronic idiopathic thrombocytopenia (HCC)    very mild  . Diverticulosis 2012 scope   pancolonic  . GERD (gastroesophageal reflux disease)   . Hearing impairment    AU; has hearing aids  . Hemorrhoids    banding 12/2019  . Hypercholesteremia   . Hypertension   . Pneumonia   . Recurrent low back pain    DDD.  PT per [place] ortho as of 03/2020  . Ulcerative esophagitis     Past Surgical History:  Procedure Laterality Date  . CARDIOVASCULAR STRESS TEST  12/2012   Nuclear stress (exercise) testing: low risk scan  . CATARACT EXTRACTION    . COLONOSCOPY N/A 07/22/2016   Procedure: COLONOSCOPY;  Surgeon: Rogene Houston, MD;  [facility];  Service: Endoscopy;  Laterality: N/A;  1:55  . COLONOSCOPY W/ POLYPECTOMY  X 4, most recent 07/22/16   2017: tubular adenoma x  2 (recall 5 yrs--? age ?)  . CORONARY ARTERY BYPASS GRAFT  2011   L - LAD, S - diag, S - PDA/LV branch 2011.  Nl EF  . HEMORRHOID BANDING  12/2019  . INGUINAL HERNIA REPAIR  1970s   right groin  . POLYPECTOMY  07/22/2016   Procedure: POLYPECTOMY;  Surgeon: Rogene Houston, MD;  [facility];  Service: Endoscopy;;  hepatic flexure,  sigmoid colon,   . TONSILLECTOMY AND ADENOIDECTOMY  as a child  . UPPER GI ENDOSCOPY  09/03/2010   hiatus hernia, no Barrett's esoph,esoph stricture dilated, gastric ulcers (h pylori neg)    There were no vitals filed for this visit.   Subjective Assessment - 04/17/20 0919    Subjective COVID-19 screen performed prior to patient entering clinic. Reports more pain in the morning after waking up but states after sitting pain improves.    Pertinent History CAD, CABG, Inguinal hernia repair.    Patient Stated Goals Decrease pain.    Currently in Pain? Yes   did not provide number on pain scale   Pain Location Back    Pain Orientation Lower;Left    Pain Descriptors / Indicators Sore;Aching    Pain Type Chronic pain    Pain Onset More than a  month ago    Pain Frequency Constant              [facility] PT Assessment - 04/17/20 0001      Assessment   Medical Diagnosis Degeneration of lumbar intervertebral disc.    Referring Provider (PT) Levy Pupa PA-C      Precautions   Precautions None                         [facility] Adult PT Treatment/Exercise - 04/17/20 0001      Exercises   Exercises Knee/Hip      Knee/Hip Exercises: Aerobic   Nustep Level 4 x 12 minutes.      Modalities   Modalities Electrical Stimulation;Moist Heat      Moist Heat Therapy   Number Minutes Moist Heat 10 Minutes    Moist Heat Location Lumbar Spine      Electrical Stimulation   Electrical Stimulation Location Bilateral low back.    Electrical Stimulation Action IFC    Electrical Stimulation Parameters 80-150 hz x10 mins    Electrical Stimulation  Goals Tone;Pain      Manual Therapy   Manual Therapy Soft tissue mobilization    Soft tissue mobilization Prone over pillow:  STW/M and TP release technique to bilateral lumbar musculature including QL release technique to decrease tone and pain                       PT Long Term Goals - 04/03/20 1453      PT LONG TERM GOAL #1   Title Independent with a HEP.    Period Weeks    Status New      PT LONG TERM GOAL #2   Title Perform ADL's with LBP level not > 3-4/10.    Time 6    Period Weeks    Status New      PT LONG TERM GOAL #3   Title Eliminate radiation of pain into left buttock.    Time 6    Period Weeks    Status New                 Plan - 04/17/20 0920    Clinical Impression Statement Patient responded well to therapy session with minimal reports of increased pain. Patient noted with L QL trigger point which released after TPR and STW/M to the area. Patient reported slight soreness after manual therapy but overall reported feeling "better" at the end of the session. Normal response to modalities upon removal.    Personal Factors and Comorbidities Comorbidity 1;Comorbidity 2    Comorbidities CAD, CABG, Inguinal hernia repair.    Examination-Activity Limitations Other    Examination-Participation Restrictions Other    Stability/Clinical Decision Making Evolving/Moderate complexity    Clinical Decision Making Moderate    Rehab Potential Good    PT Frequency 2x / week    PT Duration 6 weeks    PT Treatment/Interventions ADLs/Self Care Home Management;Cryotherapy;Electrical Stimulation;Ultrasound;Traction;Moist Heat;Therapeutic activities;Therapeutic exercise;Manual techniques;Passive range of motion;Dry needling;Spinal Manipulations    PT Next Visit Plan STW/M to left low back, Combo e'stim/US; Core exercise progression.    Consulted and Agree with Plan of Care Patient           Patient will benefit from skilled therapeutic intervention in order to  improve the following deficits and impairments:  Abnormal gait, Pain, Increased muscle spasms, Postural dysfunction, Decreased activity tolerance, Decreased range of motion  Visit  Diagnosis: Chronic left-sided low back pain without sciatica  Abnormal posture     Problem List Patient Active Problem List   Diagnosis Date Noted  . Encounter for therapeutic drug monitoring 05/24/2019  . Coronary artery disease involving native coronary artery of native heart without angina pectoris 04/01/2019  . Spondylosis of cervical region without myelopathy or radiculopathy 07/10/2018  . Neck pain 07/10/2018  . Dyslipidemia 03/30/2017  . History of colonic polyps 05/17/2016  . Chronic anticoagulation - warfarin Afib 12/04/2015  . Prolapsed internal hemorrhoids, grade 3 12/04/2015  . BPH (benign prostatic hypertrophy) 04/02/2014  . Preventative health care 04/02/2014  . Prostate cancer screening 04/02/2014  . Vertebral artery syndrome 05/18/2013  . Orthostatic hypotension 04/19/2013  . Vertigo 04/19/2013  . H/O dizziness 03/20/2013  . Dizziness 03/06/2013  . CAD (coronary artery disease) 05/12/2011  . Atrial fibrillation ([place]) 05/12/2011  . Hyperlipidemia 05/12/2011  . Hypertension     Gabriela Eves , PT, DPT 04/17/2020, 9:31 AM  [facility] [address] [place], [place], [address] Phone: [phone]   Fax:  [phone]  Name: Angel GOODWINE Sr. MRN: [id number] Date of Birth: 12/09/1933

## 2020-04-24 ENCOUNTER — Ambulatory Visit: Payer: Medicare HMO | Admitting: Physical Therapy

## 2020-04-24 ENCOUNTER — Other Ambulatory Visit: Payer: Self-pay

## 2020-04-24 DIAGNOSIS — R293 Abnormal posture: Secondary | ICD-10-CM

## 2020-04-24 DIAGNOSIS — G8929 Other chronic pain: Secondary | ICD-10-CM | POA: Diagnosis not present

## 2020-04-24 DIAGNOSIS — M545 Low back pain: Secondary | ICD-10-CM | POA: Diagnosis not present

## 2020-04-24 NOTE — Therapy (Signed)
[facility] [place], [place], [address] Phone: [phone]   Fax:  [phone]  Physical Therapy Treatment  Patient Details  Name: Angel SCHAAB Sr. MRN: [id number] Date of Birth: 01/22/1934 Referring Provider (PT): Levy Pupa PA-C   Encounter Date: 04/24/2020   PT End of Session - 04/24/20 1035    Visit Number 4    Number of Visits 12    Date for PT Re-Evaluation 05/22/20    PT Start Time 0950    PT Stop Time 1040    PT Time Calculation (min) 50 min    Activity Tolerance Patient tolerated treatment well    Behavior During Therapy [facility] for tasks assessed/performed           Past Medical History:  Diagnosis Date  . Adenomatous colon polyp   . Allergic rhinitis    primarily seasonal  . Atrial fibrillation ([place])    peri op afib initially, then dx'd with permanent a-fib 2014 (xarelto started, switched to coumadin for cost reasons)  . Borderline diabetes 05/2015; 03/2017   2016 A1c 5.8%.  2018 A1c 6.0%.  12/2017 A1c 5.9%. 08/2019 a1c 6.0%. Fasting gluc > 126 once and then 125 on f/u (08/2019).  [name] CAD (coronary artery disease)    CABG 2011  . Chronic idiopathic thrombocytopenia (HCC)    very mild  . Diverticulosis 2012 scope   pancolonic  . GERD (gastroesophageal reflux disease)   . Hearing impairment    AU; has hearing aids  . Hemorrhoids    banding 12/2019  . Hypercholesteremia   . Hypertension   . Pneumonia   . Recurrent low back pain    DDD.  PT per [place] ortho as of 03/2020  . Ulcerative esophagitis     Past Surgical History:  Procedure Laterality Date  . CARDIOVASCULAR STRESS TEST  12/2012   Nuclear stress (exercise) testing: low risk scan  . CATARACT EXTRACTION    . COLONOSCOPY N/A 07/22/2016   Procedure: COLONOSCOPY;  Surgeon: Rogene Houston, MD;  [facility];  Service: Endoscopy;  Laterality: N/A;  1:55  . COLONOSCOPY W/ POLYPECTOMY  X 4, most recent 07/22/16   2017: tubular adenoma x  2 (recall 5 yrs--? age ?)  . CORONARY ARTERY BYPASS GRAFT  2011   L - LAD, S - diag, S - PDA/LV branch 2011.  Nl EF  . HEMORRHOID BANDING  12/2019  . INGUINAL HERNIA REPAIR  1970s   right groin  . POLYPECTOMY  07/22/2016   Procedure: POLYPECTOMY;  Surgeon: Rogene Houston, MD;  [facility];  Service: Endoscopy;;  hepatic flexure,  sigmoid colon,   . TONSILLECTOMY AND ADENOIDECTOMY  as a child  . UPPER GI ENDOSCOPY  09/03/2010   hiatus hernia, no Barrett's esoph,esoph stricture dilated, gastric ulcers (h pylori neg)    There were no vitals filed for this visit.   Subjective Assessment - 04/24/20 1025    Subjective COVID-19 screen performed prior to patient entering clinic.  left side id much better.    Pertinent History CAD, CABG, Inguinal hernia repair.    Patient Stated Goals Decrease pain.    Currently in Pain? Yes    Pain Score 5     Pain Location Back    Pain Orientation Right    Pain Descriptors / Indicators Sore;Aching    Pain Type Chronic pain    Pain Onset More than a month ago  [facility] Adult PT Treatment/Exercise - 04/24/20 0001      Exercises   Exercises Knee/Hip      Knee/Hip Exercises: Aerobic   Nustep Level 4 x 10 minutes.      Acupuncturist Location Bilateral LB.    Electrical Stimulation Action IFC    Electrical Stimulation Parameters 80-150 Hz x 20 minutes.    Electrical Stimulation Goals Tone;Pain      Manual Therapy   Manual Therapy Scapular mobilization    Soft tissue mobilization prone over pillow:  STW/M x 13 minutes to patient's bilateral lumbar musculature including ischemic release technique to decrease tone.                       PT Long Term Goals - 04/03/20 1453      PT LONG TERM GOAL #1   Title Independent with a HEP.    Period Weeks    Status New      PT LONG TERM GOAL #2   Title Perform ADL's with LBP level not > 3-4/10.    Time 6     Period Weeks    Status New      PT LONG TERM GOAL #3   Title Eliminate radiation of pain into left buttock.    Time 6    Period Weeks    Status New                 Plan - 04/24/20 1028    Clinical Impression Statement Patient has been doing great and is reporting a significant reduction in left sided low back pain.  He did well with lumbar musculature trigger point release technique today.    Personal Factors and Comorbidities Comorbidity 1;Comorbidity 2    Comorbidities CAD, CABG, Inguinal hernia repair.    Examination-Activity Limitations Other    Examination-Participation Restrictions Other    Stability/Clinical Decision Making Evolving/Moderate complexity    Rehab Potential Good    PT Frequency 2x / week    PT Duration 6 weeks    PT Treatment/Interventions ADLs/Self Care Home Management;Cryotherapy;Electrical Stimulation;Ultrasound;Traction;Moist Heat;Therapeutic activities;Therapeutic exercise;Manual techniques;Passive range of motion;Dry needling;Spinal Manipulations    PT Next Visit Plan STW/M to left low back, Combo e'stim/US; Core exercise progression.           Patient will benefit from skilled therapeutic intervention in order to improve the following deficits and impairments:  Abnormal gait, Pain, Increased muscle spasms, Postural dysfunction, Decreased activity tolerance, Decreased range of motion  Visit Diagnosis: Chronic left-sided low back pain without sciatica  Abnormal posture     Problem List Patient Active Problem List   Diagnosis Date Noted  . Encounter for therapeutic drug monitoring 05/24/2019  . Coronary artery disease involving native coronary artery of native heart without angina pectoris 04/01/2019  . Spondylosis of cervical region without myelopathy or radiculopathy 07/10/2018  . Neck pain 07/10/2018  . Dyslipidemia 03/30/2017  . History of colonic polyps 05/17/2016  . Chronic anticoagulation - warfarin Afib 12/04/2015  . Prolapsed  internal hemorrhoids, grade 3 12/04/2015  . BPH (benign prostatic hypertrophy) 04/02/2014  . Preventative health care 04/02/2014  . Prostate cancer screening 04/02/2014  . Vertebral artery syndrome 05/18/2013  . Orthostatic hypotension 04/19/2013  . Vertigo 04/19/2013  . H/O dizziness 03/20/2013  . Dizziness 03/06/2013  . CAD (coronary artery disease) 05/12/2011  . Atrial fibrillation ([place]) 05/12/2011  . Hyperlipidemia 05/12/2011  . Hypertension     Treyvon Blahut, [place] MPT 04/24/2020, 10:40  [facility] [place], [place], [address] Phone: [phone]   Fax:  [phone]  Name: Angel TENCH Sr. MRN: [id number] Date of Birth: [birth year]

## 2020-04-29 ENCOUNTER — Encounter: Payer: Self-pay | Admitting: Physical Therapy

## 2020-04-29 ENCOUNTER — Ambulatory Visit: Payer: Medicare HMO | Admitting: Physical Therapy

## 2020-04-29 ENCOUNTER — Other Ambulatory Visit: Payer: Self-pay

## 2020-04-29 DIAGNOSIS — G8929 Other chronic pain: Secondary | ICD-10-CM | POA: Diagnosis not present

## 2020-04-29 DIAGNOSIS — M545 Low back pain, unspecified: Secondary | ICD-10-CM

## 2020-04-29 DIAGNOSIS — R293 Abnormal posture: Secondary | ICD-10-CM

## 2020-04-29 NOTE — Therapy (Signed)
[facility] [place], [place], [address] Phone: [phone]   Fax:  [phone]  Physical Therapy Treatment  Patient Details  Name: Angel IM Sr. MRN: [id number] Date of Birth: 10-23-1933 Referring Provider (PT): Levy Pupa PA-C   Encounter Date: 04/29/2020   PT End of Session - 04/29/20 2016    Visit Number 5    Number of Visits 12    Date for PT Re-Evaluation 05/22/20    PT Start Time 1430    PT Stop Time 1519    PT Time Calculation (min) 49 min    Activity Tolerance Patient tolerated treatment well    Behavior During Therapy [facility] for tasks assessed/performed           Past Medical History:  Diagnosis Date  . Adenomatous colon polyp   . Allergic rhinitis    primarily seasonal  . Atrial fibrillation ([place])    peri op afib initially, then dx'd with permanent a-fib 2014 (xarelto started, switched to coumadin for cost reasons)  . Borderline diabetes 05/2015; 03/2017   2016 A1c 5.8%.  2018 A1c 6.0%.  12/2017 A1c 5.9%. 08/2019 a1c 6.0%. Fasting gluc > 126 once and then 125 on f/u (08/2019).  [name] CAD (coronary artery disease)    CABG 2011  . Chronic idiopathic thrombocytopenia (HCC)    very mild  . Diverticulosis 2012 scope   pancolonic  . GERD (gastroesophageal reflux disease)   . Hearing impairment    AU; has hearing aids  . Hemorrhoids    banding 12/2019  . Hypercholesteremia   . Hypertension   . Pneumonia   . Recurrent low back pain    DDD.  PT per [place] [facility] as of 03/2020  . Ulcerative esophagitis     Past Surgical History:  Procedure Laterality Date  . CARDIOVASCULAR STRESS TEST  12/2012   Nuclear stress (exercise) testing: low risk scan  . CATARACT EXTRACTION    . COLONOSCOPY N/A 07/22/2016   Procedure: COLONOSCOPY;  Surgeon: Rogene Houston, MD;  [facility];  Service: Endoscopy;  Laterality: N/A;  1:55  . COLONOSCOPY W/ POLYPECTOMY  X 4, most recent 07/22/16   2017: tubular adenoma x  2 (recall 5 yrs--? age ?)  . CORONARY ARTERY BYPASS GRAFT  2011   L - LAD, S - diag, S - PDA/LV branch 2011.  Nl EF  . HEMORRHOID BANDING  12/2019  . INGUINAL HERNIA REPAIR  1970s   right groin  . POLYPECTOMY  07/22/2016   Procedure: POLYPECTOMY;  Surgeon: Rogene Houston, MD;  [facility];  Service: Endoscopy;;  hepatic flexure,  sigmoid colon,   . TONSILLECTOMY AND ADENOIDECTOMY  as a child  . UPPER GI ENDOSCOPY  09/03/2010   hiatus hernia, no Barrett's esoph,esoph stricture dilated, gastric ulcers (h pylori neg)    There were no vitals filed for this visit.   Subjective Assessment - 04/29/20 2015    Subjective COVID-19 screen performed prior to patient entering clinic. Patient reported high levels of pain this morning but doing well upon arrival.    Pertinent History CAD, CABG, Inguinal hernia repair.    Patient Stated Goals Decrease pain.    Currently in Pain? No/denies              [facility] PT Assessment - 04/29/20 0001      Assessment   Medical Diagnosis Degeneration of lumbar intervertebral disc.    Referring Provider (PT) Levy Pupa PA-C  Precautions   Precautions None                         [facility] Adult PT Treatment/Exercise - 04/29/20 0001      Exercises   Exercises Knee/Hip      Knee/Hip Exercises: Aerobic   Nustep Level 4 x 17 minutes.      Knee/Hip Exercises: Supine   Bridges AROM;Both;1 set;10 reps      Moist Heat Therapy   Number Minutes Moist Heat 10 Minutes    Moist Heat Location Lumbar Spine      Electrical Stimulation   Electrical Stimulation Location bilateral low back    Electrical Stimulation Action IFC    Electrical Stimulation Parameters 80-150 hz x10 mins    Electrical Stimulation Goals Tone;Pain      Manual Therapy   Manual Therapy Soft tissue mobilization    Soft tissue mobilization STW/M to right QL and lumbar paraspinals to decrease tone                  PT Education - 04/29/20 2016     Education Details bridging    Person(s) Educated Patient    Methods Explanation;Demonstration;Handout    Comprehension Verbalized understanding;Returned demonstration               PT Long Term Goals - 04/03/20 1453      PT LONG TERM GOAL #1   Title Independent with a HEP.    Period Weeks    Status New      PT LONG TERM GOAL #2   Title Perform ADL's with LBP level not > 3-4/10.    Time 6    Period Weeks    Status New      PT LONG TERM GOAL #3   Title Eliminate radiation of pain into left buttock.    Time 6    Period Weeks    Status New                 Plan - 04/29/20 2018    Clinical Impression Statement Patient responded well to therapy session with minimal pain reported with nustep. Patient noted with increased right QL tone with STW/M with improvements in tone after manual therapy. Patient educated on bridges for HEP to which he reported understanding. Normal response to modalities upon removal.    Personal Factors and Comorbidities Comorbidity 1;Comorbidity 2    Comorbidities CAD, CABG, Inguinal hernia repair.    Examination-Activity Limitations Other    Examination-Participation Restrictions Other    Stability/Clinical Decision Making Evolving/Moderate complexity    Clinical Decision Making Moderate    Rehab Potential Good    PT Frequency 2x / week    PT Duration 6 weeks    PT Treatment/Interventions ADLs/Self Care Home Management;Cryotherapy;Electrical Stimulation;Ultrasound;Traction;Moist Heat;Therapeutic activities;Therapeutic exercise;Manual techniques;Passive range of motion;Dry needling;Spinal Manipulations    PT Next Visit Plan STW/M to left low back, Combo e'stim/US; Core exercise progression.    PT Home Exercise Plan bridges    Consulted and Agree with Plan of Care Patient           Patient will benefit from skilled therapeutic intervention in order to improve the following deficits and impairments:  Abnormal gait, Pain, Increased muscle  spasms, Postural dysfunction, Decreased activity tolerance, Decreased range of motion  Visit Diagnosis: Chronic left-sided low back pain without sciatica  Abnormal posture     Problem List Patient Active Problem List   Diagnosis Date Noted  .  Encounter for therapeutic drug monitoring 05/24/2019  . Coronary artery disease involving native coronary artery of native heart without angina pectoris 04/01/2019  . Spondylosis of cervical region without myelopathy or radiculopathy 07/10/2018  . Neck pain 07/10/2018  . Dyslipidemia 03/30/2017  . History of colonic polyps 05/17/2016  . Chronic anticoagulation - warfarin Afib 12/04/2015  . Prolapsed internal hemorrhoids, grade 3 12/04/2015  . BPH (benign prostatic hypertrophy) 04/02/2014  . Preventative health care 04/02/2014  . Prostate cancer screening 04/02/2014  . Vertebral artery syndrome 05/18/2013  . Orthostatic hypotension 04/19/2013  . Vertigo 04/19/2013  . H/O dizziness 03/20/2013  . Dizziness 03/06/2013  . CAD (coronary artery disease) 05/12/2011  . Atrial fibrillation ([place]) 05/12/2011  . Hyperlipidemia 05/12/2011  . Hypertension     Gabriela Eves, PT, DPT 04/29/2020, 8:24 PM  [facility] [address] [place], [place], [address] Phone: [phone]   Fax:  [phone]  Name: Angel FAULCON Sr. MRN: [id number] Date of Birth: 1934/02/29

## 2020-05-06 ENCOUNTER — Ambulatory Visit: Payer: Medicare HMO | Attending: Chiropractic Medicine | Admitting: Physical Therapy

## 2020-05-06 ENCOUNTER — Encounter: Payer: Self-pay | Admitting: Physical Therapy

## 2020-05-06 ENCOUNTER — Other Ambulatory Visit: Payer: Self-pay

## 2020-05-06 DIAGNOSIS — R293 Abnormal posture: Secondary | ICD-10-CM | POA: Insufficient documentation

## 2020-05-06 DIAGNOSIS — M545 Low back pain, unspecified: Secondary | ICD-10-CM | POA: Diagnosis not present

## 2020-05-06 DIAGNOSIS — G8929 Other chronic pain: Secondary | ICD-10-CM | POA: Insufficient documentation

## 2020-05-06 DIAGNOSIS — M542 Cervicalgia: Secondary | ICD-10-CM | POA: Insufficient documentation

## 2020-05-06 NOTE — Therapy (Signed)
[facility] [place], [place], [address] Phone: [phone]   Fax:  [phone]  Physical Therapy Treatment  Patient Details  Name: Angel WANGERIN Sr. MRN: [id number] Date of Birth: [birth year] Referring Provider (PT): Levy Pupa PA-C   Encounter Date: 05/06/2020   PT End of Session - 05/06/20 0906    Visit Number 6    Number of Visits 12    Date for PT Re-Evaluation 05/22/20    PT Start Time 0900    PT Stop Time 0948    PT Time Calculation (min) 48 min    Activity Tolerance Patient tolerated treatment well    Behavior During Therapy [facility] for tasks assessed/performed           Past Medical History:  Diagnosis Date  . Adenomatous colon polyp   . Allergic rhinitis    primarily seasonal  . Atrial fibrillation ([place])    peri op afib initially, then dx'd with permanent a-fib 2014 (xarelto started, switched to coumadin for cost reasons)  . Borderline diabetes 05/2015; 03/2017   2016 A1c 5.8%.  2018 A1c 6.0%.  12/2017 A1c 5.9%. 08/2019 a1c 6.0%. Fasting gluc > 126 once and then 125 on f/u (08/2019).  [name] CAD (coronary artery disease)    CABG 2011  . Chronic idiopathic thrombocytopenia (HCC)    very mild  . Diverticulosis 2012 scope   pancolonic  . GERD (gastroesophageal reflux disease)   . Hearing impairment    AU; has hearing aids  . Hemorrhoids    banding 12/2019  . Hypercholesteremia   . Hypertension   . Pneumonia   . Recurrent low back pain    DDD.  PT per [place] [facility] as of 03/2020  . Ulcerative esophagitis     Past Surgical History:  Procedure Laterality Date  . CARDIOVASCULAR STRESS TEST  12/2012   Nuclear stress (exercise) testing: low risk scan  . CATARACT EXTRACTION    . COLONOSCOPY N/A 07/22/2016   Procedure: COLONOSCOPY;  Surgeon: Rogene Houston, MD;  [facility];  Service: Endoscopy;  Laterality: N/A;  1:55  . COLONOSCOPY W/ POLYPECTOMY  X 4, most recent 07/22/16   2017: tubular adenoma x  2 (recall 5 yrs--? age ?)  . CORONARY ARTERY BYPASS GRAFT  2011   L - LAD, S - diag, S - PDA/LV branch 2011.  Nl EF  . HEMORRHOID BANDING  12/2019  . INGUINAL HERNIA REPAIR  1970s   right groin  . POLYPECTOMY  07/22/2016   Procedure: POLYPECTOMY;  Surgeon: Rogene Houston, MD;  [facility];  Service: Endoscopy;;  hepatic flexure,  sigmoid colon,   . TONSILLECTOMY AND ADENOIDECTOMY  as a child  . UPPER GI ENDOSCOPY  09/03/2010   hiatus hernia, no Barrett's esoph,esoph stricture dilated, gastric ulcers (h pylori neg)    There were no vitals filed for this visit.   Subjective Assessment - 05/06/20 0904    Subjective COVID-19 screen performed prior to patient entering clinic. Patient arrives with ongoing pain in the morning time but once he gets up and going, pain decreases.    Pertinent History CAD, CABG, Inguinal hernia repair.    Patient Stated Goals Decrease pain.    Currently in Pain? Yes   "light pain"             [facility] PT Assessment - 05/06/20 0001      Assessment   Medical Diagnosis Degeneration of lumbar intervertebral disc.  Referring Provider (PT) Levy Pupa PA-C      Precautions   Precautions None                         [facility] Adult PT Treatment/Exercise - 05/06/20 0001      Exercises   Exercises Lumbar      Lumbar Exercises: Standing   Row Strengthening;Both;Theraband;20 reps    Theraband Level (Row) Level 2 (Red)    Shoulder Extension Strengthening;Both;20 reps;Theraband    Theraband Level (Shoulder Extension) Level 2 (Red)      Knee/Hip Exercises: Aerobic   Nustep Level 4 x 15 minutes.      Moist Heat Therapy   Number Minutes Moist Heat 10 Minutes    Moist Heat Location Lumbar Spine      Electrical Stimulation   Electrical Stimulation Location Right QL     Electrical Stimulation Action pre-mod    Electrical Stimulation Parameters 80-150 hz x10 mins    Electrical Stimulation Goals Tone;Pain      Manual Therapy    Manual Therapy Soft tissue mobilization    Soft tissue mobilization STW/M to right QL and lumbar paraspinals in left sidelying to decrease tone                  PT Education - 05/06/20 0956    Education Details rows and extension with red theraband    Person(s) Educated Patient    Methods Explanation;Demonstration;Handout    Comprehension Verbalized understanding;Returned demonstration               PT Long Term Goals - 04/03/20 1453      PT LONG TERM GOAL #1   Title Independent with a HEP.    Period Weeks    Status New      PT LONG TERM GOAL #2   Title Perform ADL's with LBP level not > 3-4/10.    Time 6    Period Weeks    Status New      PT LONG TERM GOAL #3   Title Eliminate radiation of pain into left buttock.    Time 6    Period Weeks    Status New                 Plan - 05/06/20 0948    Clinical Impression Statement Patient responded well to therapy session with additional TEs for core strengthening. Patient demonstrated excellent form and technique with TEs Patient provided with HEP for rows and extension with red theraband. Right QL with continued tone but overall did well with STW/M. Normal response to modalities upon removal.    Personal Factors and Comorbidities Comorbidity 1;Comorbidity 2    Comorbidities CAD, CABG, Inguinal hernia repair.    Examination-Activity Limitations Other    Examination-Participation Restrictions Other    Stability/Clinical Decision Making Evolving/Moderate complexity    Clinical Decision Making Moderate    Rehab Potential Good    PT Frequency 2x / week    PT Duration 6 weeks    PT Treatment/Interventions ADLs/Self Care Home Management;Cryotherapy;Electrical Stimulation;Ultrasound;Traction;Moist Heat;Therapeutic activities;Therapeutic exercise;Manual techniques;Passive range of motion;Dry needling;Spinal Manipulations    PT Next Visit Plan Continue with progressing core exercises and LE strengthening STW/M to R QL  modalities as needed.    PT Home Exercise Plan see patient education section    Consulted and Agree with Plan of Care Patient           Patient will benefit from skilled therapeutic  intervention in order to improve the following deficits and impairments:  Abnormal gait, Pain, Increased muscle spasms, Postural dysfunction, Decreased activity tolerance, Decreased range of motion  Visit Diagnosis: Chronic left-sided low back pain without sciatica  Abnormal posture     Problem List Patient Active Problem List   Diagnosis Date Noted  . Encounter for therapeutic drug monitoring 05/24/2019  . Coronary artery disease involving native coronary artery of native heart without angina pectoris 04/01/2019  . Spondylosis of cervical region without myelopathy or radiculopathy 07/10/2018  . Neck pain 07/10/2018  . Dyslipidemia 03/30/2017  . History of colonic polyps 05/17/2016  . Chronic anticoagulation - warfarin Afib 12/04/2015  . Prolapsed internal hemorrhoids, grade 3 12/04/2015  . BPH (benign prostatic hypertrophy) 04/02/2014  . Preventative health care 04/02/2014  . Prostate cancer screening 04/02/2014  . Vertebral artery syndrome 05/18/2013  . Orthostatic hypotension 04/19/2013  . Vertigo 04/19/2013  . H/O dizziness 03/20/2013  . Dizziness 03/06/2013  . CAD (coronary artery disease) 05/12/2011  . Atrial fibrillation ([place]) 05/12/2011  . Hyperlipidemia 05/12/2011  . Hypertension     Gabriela Eves, PT, DPT 05/06/2020, 9:56 AM  [facility] [address] [place], [place], [address] Phone: [phone]   Fax:  [phone]  Name: RAS KOLLMAN Sr. MRN: [id number] Date of Birth: 1933/12/24

## 2020-05-08 ENCOUNTER — Ambulatory Visit (INDEPENDENT_AMBULATORY_CARE_PROVIDER_SITE_OTHER): Payer: Medicare HMO | Admitting: *Deleted

## 2020-05-08 DIAGNOSIS — Z5181 Encounter for therapeutic drug level monitoring: Secondary | ICD-10-CM | POA: Diagnosis not present

## 2020-05-08 DIAGNOSIS — I4821 Permanent atrial fibrillation: Secondary | ICD-10-CM | POA: Diagnosis not present

## 2020-05-08 DIAGNOSIS — I4819 Other persistent atrial fibrillation: Secondary | ICD-10-CM | POA: Diagnosis not present

## 2020-05-08 LAB — POCT INR: INR: 2.3 (ref 2.0–3.0)

## 2020-05-08 NOTE — Patient Instructions (Signed)
Continue warfarin 1 tablet daily except 1 1/2 tablets on Mondays and Wednesdays Recheck in 6 weeks 

## 2020-05-13 ENCOUNTER — Other Ambulatory Visit: Payer: Self-pay

## 2020-05-13 ENCOUNTER — Ambulatory Visit: Payer: Medicare HMO | Admitting: Physical Therapy

## 2020-05-13 DIAGNOSIS — R293 Abnormal posture: Secondary | ICD-10-CM | POA: Diagnosis not present

## 2020-05-13 DIAGNOSIS — G8929 Other chronic pain: Secondary | ICD-10-CM | POA: Diagnosis not present

## 2020-05-13 DIAGNOSIS — M545 Low back pain, unspecified: Secondary | ICD-10-CM

## 2020-05-13 DIAGNOSIS — M542 Cervicalgia: Secondary | ICD-10-CM | POA: Diagnosis not present

## 2020-05-13 NOTE — Therapy (Signed)
[facility] [place], [place], [address] Phone: [phone]   Fax:  [phone]  Physical Therapy Treatment  Patient Details  Name: Angel SUESS Sr. MRN: [id number] Date of Birth: 1934/02/24 Referring Provider (PT): Levy Pupa PA-C   Encounter Date: 05/13/2020   PT End of Session - 05/13/20 1028    Visit Number 7    Number of Visits 12    Date for PT Re-Evaluation 05/22/20    PT Start Time 0907    PT Stop Time 1013    PT Time Calculation (min) 66 min    Activity Tolerance Patient tolerated treatment well    Behavior During Therapy [facility] for tasks assessed/performed           Past Medical History:  Diagnosis Date  . Adenomatous colon polyp   . Allergic rhinitis    primarily seasonal  . Atrial fibrillation ([place])    peri op afib initially, then dx'd with permanent a-fib 2014 (xarelto started, switched to coumadin for cost reasons)  . Borderline diabetes 05/2015; 03/2017   2016 A1c 5.8%.  2018 A1c 6.0%.  12/2017 A1c 5.9%. 08/2019 a1c 6.0%. Fasting gluc > 126 once and then 125 on f/u (08/2019).  [name] CAD (coronary artery disease)    CABG 2011  . Chronic idiopathic thrombocytopenia (HCC)    very mild  . Diverticulosis 2012 scope   pancolonic  . GERD (gastroesophageal reflux disease)   . Hearing impairment    AU; has hearing aids  . Hemorrhoids    banding 12/2019  . Hypercholesteremia   . Hypertension   . Pneumonia   . Recurrent low back pain    DDD.  PT per [place] [facility] as of 03/2020  . Ulcerative esophagitis     Past Surgical History:  Procedure Laterality Date  . CARDIOVASCULAR STRESS TEST  12/2012   Nuclear stress (exercise) testing: low risk scan  . CATARACT EXTRACTION    . COLONOSCOPY N/A 07/22/2016   Procedure: COLONOSCOPY;  Surgeon: Rogene Houston, MD;  [facility];  Service: Endoscopy;  Laterality: N/A;  1:55  . COLONOSCOPY W/ POLYPECTOMY  X 4, most recent 07/22/16   2017: tubular adenoma x  2 (recall 5 yrs--? age ?)  . CORONARY ARTERY BYPASS GRAFT  2011   L - LAD, S - diag, S - PDA/LV branch 2011.  Nl EF  . HEMORRHOID BANDING  12/2019  . INGUINAL HERNIA REPAIR  1970s   right groin  . POLYPECTOMY  07/22/2016   Procedure: POLYPECTOMY;  Surgeon: Rogene Houston, MD;  [facility];  Service: Endoscopy;;  hepatic flexure,  sigmoid colon,   . TONSILLECTOMY AND ADENOIDECTOMY  as a child  . UPPER GI ENDOSCOPY  09/03/2010   hiatus hernia, no Barrett's esoph,esoph stricture dilated, gastric ulcers (h pylori neg)    There were no vitals filed for this visit.   Subjective Assessment - 05/13/20 1008    Subjective COVID-19 screen performed prior to patient entering clinic.  Pain quite bad in moring.  After I get up awhile and sit down to have a cup of coffee the pain goes down.    Pertinent History CAD, CABG, Inguinal hernia repair.    Patient Stated Goals Decrease pain.    Pain Score 4     Pain Location Back    Pain Orientation Right;Left    Pain Descriptors / Indicators Aching    Pain Type Chronic pain    Pain Onset More  than a month ago                             [facility] Adult PT Treatment/Exercise - 05/13/20 0001      Exercises   Exercises Knee/Hip      Lumbar Exercises: Aerobic   Nustep Level 4 x 15 minutes.      Modalities   Modalities Electrical Stimulation;Moist Heat;Traction      Moist Heat Therapy   Number Minutes Moist Heat 20 Minutes    Moist Heat Location Lumbar Spine      Electrical Stimulation   Electrical Stimulation Location Bilateral SIJ's.    Electrical Stimulation Action Pre-mod.    Electrical Stimulation Parameters 80-150 Hz x 20 minutes.    Electrical Stimulation Goals Tone;Pain      Traction   Type of Traction Lumbar    Min (lbs) 15    Max (lbs) 60    Hold Time 99    Rest Time 5    Time 15                       PT Long Term Goals - 04/03/20 1453      PT LONG TERM GOAL #1   Title Independent  with a HEP.    Period Weeks    Status New      PT LONG TERM GOAL #2   Title Perform ADL's with LBP level not > 3-4/10.    Time 6    Period Weeks    Status New      PT LONG TERM GOAL #3   Title Eliminate radiation of pain into left buttock.    Time 6    Period Weeks    Status New                 Plan - 05/13/20 1025    Clinical Impression Statement Patient did great today with the addition of intermittment lumbar traction.  No complaints following treatment.    Personal Factors and Comorbidities Comorbidity 1;Comorbidity 2    Comorbidities CAD, CABG, Inguinal hernia repair.    Examination-Activity Limitations Other    Stability/Clinical Decision Making Evolving/Moderate complexity    Rehab Potential Good    PT Frequency 2x / week    PT Duration 6 weeks    PT Treatment/Interventions ADLs/Self Care Home Management;Cryotherapy;Electrical Stimulation;Ultrasound;Traction;Moist Heat;Therapeutic activities;Therapeutic exercise;Manual techniques;Passive range of motion;Dry needling;Spinal Manipulations    PT Next Visit Plan Continue with progressing core exercises and LE strengthening STW/M to R QL modalities as needed.    PT Home Exercise Plan see patient education section    Consulted and Agree with Plan of Care Patient           Patient will benefit from skilled therapeutic intervention in order to improve the following deficits and impairments:  Abnormal gait, Pain, Increased muscle spasms, Postural dysfunction, Decreased activity tolerance, Decreased range of motion  Visit Diagnosis: Chronic left-sided low back pain without sciatica  Abnormal posture     Problem List Patient Active Problem List   Diagnosis Date Noted  . Encounter for therapeutic drug monitoring 05/24/2019  . Coronary artery disease involving native coronary artery of native heart without angina pectoris 04/01/2019  . Spondylosis of cervical region without myelopathy or radiculopathy 07/10/2018   . Neck pain 07/10/2018  . Dyslipidemia 03/30/2017  . History of colonic polyps 05/17/2016  . Chronic anticoagulation - warfarin Afib 12/04/2015  .  Prolapsed internal hemorrhoids, grade 3 12/04/2015  . BPH (benign prostatic hypertrophy) 04/02/2014  . Preventative health care 04/02/2014  . Prostate cancer screening 04/02/2014  . Vertebral artery syndrome 05/18/2013  . Orthostatic hypotension 04/19/2013  . Vertigo 04/19/2013  . H/O dizziness 03/20/2013  . Dizziness 03/06/2013  . CAD (coronary artery disease) 05/12/2011  . Atrial fibrillation ([place]) 05/12/2011  . Hyperlipidemia 05/12/2011  . Hypertension     Jahmel Flannagan, [place] MPT 05/13/2020, 10:55 AM  [facility] [address] [place], [place], [address] Phone: [phone]   Fax:  [phone]  Name: Angel DELFAVERO Sr. MRN: [id number] Date of Birth: [birth year]

## 2020-05-14 ENCOUNTER — Ambulatory Visit (INDEPENDENT_AMBULATORY_CARE_PROVIDER_SITE_OTHER): Payer: Medicare HMO

## 2020-05-14 DIAGNOSIS — Z23 Encounter for immunization: Secondary | ICD-10-CM

## 2020-05-20 ENCOUNTER — Ambulatory Visit: Payer: Medicare HMO | Admitting: Physical Therapy

## 2020-05-20 ENCOUNTER — Encounter: Payer: Self-pay | Admitting: Physical Therapy

## 2020-05-20 ENCOUNTER — Other Ambulatory Visit: Payer: Self-pay

## 2020-05-20 DIAGNOSIS — M545 Low back pain, unspecified: Secondary | ICD-10-CM | POA: Diagnosis not present

## 2020-05-20 DIAGNOSIS — G8929 Other chronic pain: Secondary | ICD-10-CM

## 2020-05-20 DIAGNOSIS — R293 Abnormal posture: Secondary | ICD-10-CM

## 2020-05-20 DIAGNOSIS — M542 Cervicalgia: Secondary | ICD-10-CM

## 2020-05-20 NOTE — Therapy (Signed)
[facility] [place], [place], [address] Phone: [phone]   Fax:  [phone]  Physical Therapy Treatment  Patient Details  Name: Angel GRAU Sr. MRN: [id number] Date of Birth: May 15, 1934 Referring Provider (PT): Levy Pupa PA-C   Encounter Date: 05/20/2020   PT End of Session - 05/20/20 1000    Visit Number 8    Number of Visits 12    Date for PT Re-Evaluation 05/22/20    PT Start Time 0904    PT Stop Time 1005    PT Time Calculation (min) 61 min    Activity Tolerance Patient tolerated treatment well    Behavior During Therapy [facility] for tasks assessed/performed           Past Medical History:  Diagnosis Date  . Adenomatous colon polyp   . Allergic rhinitis    primarily seasonal  . Atrial fibrillation ([place])    peri op afib initially, then dx'd with permanent a-fib 2014 (xarelto started, switched to coumadin for cost reasons)  . Borderline diabetes 05/2015; 03/2017   2016 A1c 5.8%.  2018 A1c 6.0%.  12/2017 A1c 5.9%. 08/2019 a1c 6.0%. Fasting gluc > 126 once and then 125 on f/u (08/2019).  [name] CAD (coronary artery disease)    CABG 2011  . Chronic idiopathic thrombocytopenia (HCC)    very mild  . Diverticulosis 2012 scope   pancolonic  . GERD (gastroesophageal reflux disease)   . Hearing impairment    AU; has hearing aids  . Hemorrhoids    banding 12/2019  . Hypercholesteremia   . Hypertension   . Pneumonia   . Recurrent low back pain    DDD.  PT per [facility] as of 03/2020  . Ulcerative esophagitis     Past Surgical History:  Procedure Laterality Date  . CARDIOVASCULAR STRESS TEST  12/2012   Nuclear stress (exercise) testing: low risk scan  . CATARACT EXTRACTION    . COLONOSCOPY N/A 07/22/2016   Procedure: COLONOSCOPY;  Surgeon: Rogene Houston, MD;  [facility];  Service: Endoscopy;  Laterality: N/A;  1:55  . COLONOSCOPY W/ POLYPECTOMY  X 4, most recent 07/22/16   2017: tubular adenoma x  2 (recall 5 yrs--? age ?)  . CORONARY ARTERY BYPASS GRAFT  2011   L - LAD, S - diag, S - PDA/LV branch 2011.  Nl EF  . HEMORRHOID BANDING  12/2019  . INGUINAL HERNIA REPAIR  1970s   right groin  . POLYPECTOMY  07/22/2016   Procedure: POLYPECTOMY;  Surgeon: Rogene Houston, MD;  [facility];  Service: Endoscopy;;  hepatic flexure,  sigmoid colon,   . TONSILLECTOMY AND ADENOIDECTOMY  as a child  . UPPER GI ENDOSCOPY  09/03/2010   hiatus hernia, no Barrett's esoph,esoph stricture dilated, gastric ulcers (h pylori neg)    There were no vitals filed for this visit.   Subjective Assessment - 05/20/20 0930    Subjective COVID-19 screen performed prior to patient entering clinic.  Did well with traction.  Over the weekend patient had onemorning in which he awoke with no pain.    Pertinent History CAD, CABG, Inguinal hernia repair.    Patient Stated Goals Decrease pain.    Currently in Pain? Yes    Pain Orientation Right;Left    Pain Descriptors / Indicators Aching    Pain Type Chronic pain    Pain Onset More than a month ago  [facility] Adult PT Treatment/Exercise - 05/20/20 0001      Exercises   Exercises Knee/Hip      Lumbar Exercises: Aerobic   Nustep Level 5 x 15 minutes.      Modalities   Modalities Electrical Stimulation;Moist Heat      Moist Heat Therapy   Number Minutes Moist Heat 20 Minutes    Moist Heat Location Cervical      Electrical Stimulation   Electrical Stimulation Location Lumbar.    Electrical Stimulation Action Pre-mod.    Electrical Stimulation Parameters 80-150 Hz x 20 minutes.    Electrical Stimulation Goals Tone;Pain      Traction   Type of Traction Lumbar    Min (lbs) 15    Max (lbs) 70    Hold Time 99    Rest Time 5    Time 15                       PT Long Term Goals - 04/03/20 1453      PT LONG TERM GOAL #1   Title Independent with a HEP.    Period Weeks    Status New       PT LONG TERM GOAL #2   Title Perform ADL's with LBP level not > 3-4/10.    Time 6    Period Weeks    Status New      PT LONG TERM GOAL #3   Title Eliminate radiation of pain into left buttock.    Time 6    Period Weeks    Status New                 Plan - 05/20/20 0957    Clinical Impression Statement Patient responding well to treatments.  Increase lumbar traction by 10# without complaint.    Personal Factors and Comorbidities Comorbidity 1;Comorbidity 2    Comorbidities CAD, CABG, Inguinal hernia repair.    Examination-Activity Limitations Other    Stability/Clinical Decision Making Evolving/Moderate complexity    Rehab Potential Good    PT Frequency 2x / week    PT Duration 6 weeks    PT Treatment/Interventions ADLs/Self Care Home Management;Cryotherapy;Electrical Stimulation;Ultrasound;Traction;Moist Heat;Therapeutic activities;Therapeutic exercise;Manual techniques;Passive range of motion;Dry needling;Spinal Manipulations    PT Next Visit Plan Continue with progressing core exercises and LE strengthening STW/M to R QL modalities as needed.    Consulted and Agree with Plan of Care Patient           Patient will benefit from skilled therapeutic intervention in order to improve the following deficits and impairments:  Abnormal gait, Pain, Increased muscle spasms, Postural dysfunction, Decreased activity tolerance, Decreased range of motion  Visit Diagnosis: Chronic left-sided low back pain without sciatica  Abnormal posture  Cervicalgia     Problem List Patient Active Problem List   Diagnosis Date Noted  . Encounter for therapeutic drug monitoring 05/24/2019  . Coronary artery disease involving native coronary artery of native heart without angina pectoris 04/01/2019  . Spondylosis of cervical region without myelopathy or radiculopathy 07/10/2018  . Neck pain 07/10/2018  . Dyslipidemia 03/30/2017  . History of colonic polyps 05/17/2016  . Chronic  anticoagulation - warfarin Afib 12/04/2015  . Prolapsed internal hemorrhoids, grade 3 12/04/2015  . BPH (benign prostatic hypertrophy) 04/02/2014  . Preventative health care 04/02/2014  . Prostate cancer screening 04/02/2014  . Vertebral artery syndrome 05/18/2013  . Orthostatic hypotension 04/19/2013  . Vertigo 04/19/2013  . H/O dizziness 03/20/2013  .  Dizziness 03/06/2013  . CAD (coronary artery disease) 05/12/2011  . Atrial fibrillation ([place]) 05/12/2011  . Hyperlipidemia 05/12/2011  . Hypertension     Brilee Port, [place] MPT 05/20/2020, 10:06 AM  [facility] [address] [place], [place], [address] Phone: [phone]   Fax:  [phone]  Name: Angel HUSMANN Sr. MRN: [id number] Date of Birth: 10/02/1933

## 2020-05-27 ENCOUNTER — Other Ambulatory Visit: Payer: Self-pay

## 2020-05-27 ENCOUNTER — Ambulatory Visit: Payer: Medicare HMO | Admitting: *Deleted

## 2020-05-27 DIAGNOSIS — M545 Low back pain, unspecified: Secondary | ICD-10-CM

## 2020-05-27 DIAGNOSIS — M542 Cervicalgia: Secondary | ICD-10-CM | POA: Diagnosis not present

## 2020-05-27 DIAGNOSIS — R293 Abnormal posture: Secondary | ICD-10-CM | POA: Diagnosis not present

## 2020-05-27 DIAGNOSIS — G8929 Other chronic pain: Secondary | ICD-10-CM | POA: Diagnosis not present

## 2020-05-27 NOTE — Therapy (Signed)
[facility] [place], [place], [address] Phone: [phone]   Fax:  [phone]  Physical Therapy Treatment  Patient Details  Name: Angel YOUNGLOVE Sr. MRN: [id number] Date of Birth: 1934-01-09 Referring Provider (PT): Levy Pupa PA-C   Encounter Date: 05/27/2020   PT End of Session - 05/27/20 1000    Visit Number 9    Number of Visits 12    Date for PT Re-Evaluation 05/22/20    PT Start Time 0945    PT Stop Time 1036    PT Time Calculation (min) 51 min           Past Medical History:  Diagnosis Date  . Adenomatous colon polyp   . Allergic rhinitis    primarily seasonal  . Atrial fibrillation ([place])    peri op afib initially, then dx'd with permanent a-fib 2014 (xarelto started, switched to coumadin for cost reasons)  . Borderline diabetes 05/2015; 03/2017   2016 A1c 5.8%.  2018 A1c 6.0%.  12/2017 A1c 5.9%. 08/2019 a1c 6.0%. Fasting gluc > 126 once and then 125 on f/u (08/2019).  [name] CAD (coronary artery disease)    CABG 2011  . Chronic idiopathic thrombocytopenia (HCC)    very mild  . Diverticulosis 2012 scope   pancolonic  . GERD (gastroesophageal reflux disease)   . Hearing impairment    AU; has hearing aids  . Hemorrhoids    banding 12/2019  . Hypercholesteremia   . Hypertension   . Pneumonia   . Recurrent low back pain    DDD.  PT per [facility] as of 03/2020  . Ulcerative esophagitis     Past Surgical History:  Procedure Laterality Date  . CARDIOVASCULAR STRESS TEST  12/2012   Nuclear stress (exercise) testing: low risk scan  . CATARACT EXTRACTION    . COLONOSCOPY N/A 07/22/2016   Procedure: COLONOSCOPY;  Surgeon: Rogene Houston, MD;  [facility];  Service: Endoscopy;  Laterality: N/A;  1:55  . COLONOSCOPY W/ POLYPECTOMY  X 4, most recent 07/22/16   2017: tubular adenoma x 2 (recall 5 yrs--? age ?)  . CORONARY ARTERY BYPASS GRAFT  2011   L - LAD, S - diag, S - PDA/LV branch 2011.  Nl  EF  . HEMORRHOID BANDING  12/2019  . INGUINAL HERNIA REPAIR  1970s   right groin  . POLYPECTOMY  07/22/2016   Procedure: POLYPECTOMY;  Surgeon: Rogene Houston, MD;  [facility];  Service: Endoscopy;;  hepatic flexure,  sigmoid colon,   . TONSILLECTOMY AND ADENOIDECTOMY  as a child  . UPPER GI ENDOSCOPY  09/03/2010   hiatus hernia, no Barrett's esoph,esoph stricture dilated, gastric ulcers (h pylori neg)    There were no vitals filed for this visit.                      [facility] Adult PT Treatment/Exercise - 05/27/20 0001      Exercises   Exercises Knee/Hip      Lumbar Exercises: Aerobic   Nustep Level 5 x 15 minutes.      Modalities   Modalities Electrical Stimulation;Moist Heat      Moist Heat Therapy   Number Minutes Moist Heat 15 Minutes    Moist Heat Location Lumbar Spine      Electrical Stimulation   Electrical Stimulation Location Lumbar.    Electrical Stimulation Action IFC    Electrical Stimulation Parameters 80-150hz  x 15 mins  Electrical Stimulation Goals Tone;Pain      Traction   Type of Traction Lumbar    Min (lbs) 15    Max (lbs) 75    Hold Time 99    Rest Time 5    Time 15                       PT Long Term Goals - 04/03/20 1453      PT LONG TERM GOAL #1   Title Independent with a HEP.    Period Weeks    Status New      PT LONG TERM GOAL #2   Title Perform ADL's with LBP level not > 3-4/10.    Time 6    Period Weeks    Status New      PT LONG TERM GOAL #3   Title Eliminate radiation of pain into left buttock.    Time 6    Period Weeks    Status New                 Plan - 05/27/20 1308    Clinical Impression Statement Pt arrived today doing better after each Rx with less pain. Traction at 75#s today and tolerated well. Normal modality response today    Personal Factors and Comorbidities Comorbidity 1;Comorbidity 2    Comorbidities CAD, CABG, Inguinal hernia repair.    Examination-Activity  Limitations Other    Examination-Participation Restrictions Other    Stability/Clinical Decision Making Evolving/Moderate complexity    Rehab Potential Good    PT Frequency 2x / week    PT Duration 6 weeks    PT Treatment/Interventions ADLs/Self Care Home Management;Cryotherapy;Electrical Stimulation;Ultrasound;Traction;Moist Heat;Therapeutic activities;Therapeutic exercise;Manual techniques;Passive range of motion;Dry needling;Spinal Manipulations    PT Next Visit Plan Continue with progressing core exercises and LE strengthening STW/M to R QL modalities as needed.    Consulted and Agree with Plan of Care Patient           Patient will benefit from skilled therapeutic intervention in order to improve the following deficits and impairments:  Abnormal gait, Pain, Increased muscle spasms, Postural dysfunction, Decreased activity tolerance, Decreased range of motion  Visit Diagnosis: Chronic left-sided low back pain without sciatica     Problem List Patient Active Problem List   Diagnosis Date Noted  . Encounter for therapeutic drug monitoring 05/24/2019  . Coronary artery disease involving native coronary artery of native heart without angina pectoris 04/01/2019  . Spondylosis of cervical region without myelopathy or radiculopathy 07/10/2018  . Neck pain 07/10/2018  . Dyslipidemia 03/30/2017  . History of colonic polyps 05/17/2016  . Chronic anticoagulation - warfarin Afib 12/04/2015  . Prolapsed internal hemorrhoids, grade 3 12/04/2015  . BPH (benign prostatic hypertrophy) 04/02/2014  . Preventative health care 04/02/2014  . Prostate cancer screening 04/02/2014  . Vertebral artery syndrome 05/18/2013  . Orthostatic hypotension 04/19/2013  . Vertigo 04/19/2013  . H/O dizziness 03/20/2013  . Dizziness 03/06/2013  . CAD (coronary artery disease) 05/12/2011  . Atrial fibrillation ([place]) 05/12/2011  . Hyperlipidemia 05/12/2011  . Hypertension     Cherron Blitzer,CHRIS,  PTA 05/27/2020, 1:15 PM  [facility] [address] [place], [place], [address] Phone: [phone]   Fax:  [phone]  Name: Angel SWETT Sr. MRN: [id number] Date of Birth: 08/05/1933

## 2020-06-03 ENCOUNTER — Other Ambulatory Visit: Payer: Self-pay

## 2020-06-03 ENCOUNTER — Ambulatory Visit: Payer: Medicare HMO | Attending: Chiropractic Medicine | Admitting: Physical Therapy

## 2020-06-03 DIAGNOSIS — M545 Low back pain, unspecified: Secondary | ICD-10-CM | POA: Insufficient documentation

## 2020-06-03 DIAGNOSIS — G8929 Other chronic pain: Secondary | ICD-10-CM | POA: Diagnosis not present

## 2020-06-03 DIAGNOSIS — R293 Abnormal posture: Secondary | ICD-10-CM | POA: Diagnosis not present

## 2020-06-03 NOTE — Therapy (Addendum)
[facility] [place], [place], [address] Phone: [phone]   Fax:  [phone]  Physical Therapy Treatment  Patient Details  Name: Angel BOWLAND Sr. MRN: [id number] Date of Birth: 05-Feb-1934 Referring Provider (PT): Levy Pupa PA-C   Encounter Date: 06/03/2020   PT End of Session - 06/03/20 1024    Visit Number 10    Number of Visits 12    Date for PT Re-Evaluation 05/22/20    PT Start Time 0948    PT Stop Time 1040    PT Time Calculation (min) 52 min    Activity Tolerance Patient tolerated treatment well    Behavior During Therapy [facility] for tasks assessed/performed           Past Medical History:  Diagnosis Date  . Adenomatous colon polyp   . Allergic rhinitis    primarily seasonal  . Atrial fibrillation ([place])    peri op afib initially, then dx'd with permanent a-fib 2014 (xarelto started, switched to coumadin for cost reasons)  . Borderline diabetes 05/2015; 03/2017   2016 A1c 5.8%.  2018 A1c 6.0%.  12/2017 A1c 5.9%. 08/2019 a1c 6.0%. Fasting gluc > 126 once and then 125 on f/u (08/2019).  [name] CAD (coronary artery disease)    CABG 2011  . Chronic idiopathic thrombocytopenia (HCC)    very mild  . Diverticulosis 2012 scope   pancolonic  . GERD (gastroesophageal reflux disease)   . Hearing impairment    AU; has hearing aids  . Hemorrhoids    banding 12/2019  . Hypercholesteremia   . Hypertension   . Pneumonia   . Recurrent low back pain    DDD.  PT per [facility] as of 03/2020  . Ulcerative esophagitis     Past Surgical History:  Procedure Laterality Date  . CARDIOVASCULAR STRESS TEST  12/2012   Nuclear stress (exercise) testing: low risk scan  . CATARACT EXTRACTION    . COLONOSCOPY N/A 07/22/2016   Procedure: COLONOSCOPY;  Surgeon: Rogene Houston, MD;  [facility];  Service: Endoscopy;  Laterality: N/A;  1:55  . COLONOSCOPY W/ POLYPECTOMY  X 4, most recent 07/22/16   2017: tubular adenoma x  2 (recall 5 yrs--? age ?)  . CORONARY ARTERY BYPASS GRAFT  2011   L - LAD, S - diag, S - PDA/LV branch 2011.  Nl EF  . HEMORRHOID BANDING  12/2019  . INGUINAL HERNIA REPAIR  1970s   right groin  . POLYPECTOMY  07/22/2016   Procedure: POLYPECTOMY;  Surgeon: Rogene Houston, MD;  [facility];  Service: Endoscopy;;  hepatic flexure,  sigmoid colon,   . TONSILLECTOMY AND ADENOIDECTOMY  as a child  . UPPER GI ENDOSCOPY  09/03/2010   hiatus hernia, no Barrett's esoph,esoph stricture dilated, gastric ulcers (h pylori neg)    There were no vitals filed for this visit.   Subjective Assessment - 06/03/20 1022    Subjective COVID-19 screen performed prior to patient entering clinic.  Noticed my spine seems straighter by the way my pants and belt look.    Pertinent History CAD, CABG, Inguinal hernia repair.    Currently in Pain? Yes    Pain Score 3     Pain Location Back    Pain Orientation Right;Left    Pain Descriptors / Indicators Aching    Pain Type Chronic pain    Pain Onset More than a month ago  [facility] Adult PT Treatment/Exercise - 06/03/20 0001      Exercises   Exercises Knee/Hip (P)       Lumbar Exercises: Machines for Strengthening   Cybex Lumbar Extension 70# x 4 minutes. (P)     Other Lumbar Machine Exercise Ab machine 70# x 4 minutes. (P)           Int traction at 80# x 15 minutes with a 99 sec hold and 5 sec rest.   Nustep x 15 minutes.          PT Long Term Goals - 04/03/20 1453      PT LONG TERM GOAL #1   Title Independent with a HEP.    Period Weeks    Status New      PT LONG TERM GOAL #2   Title Perform ADL's with LBP level not > 3-4/10.    Time 6    Period Weeks    Status New      PT LONG TERM GOAL #3   Title Eliminate radiation of pain into left buttock.    Time 6    Period Weeks    Status New                 Plan - 06/03/20 1139    Clinical Impression Statement See 'Therapy  Note" section.    Personal Factors and Comorbidities Comorbidity 1;Comorbidity 2    Comorbidities CAD, CABG, Inguinal hernia repair.    Examination-Activity Limitations Other    Examination-Participation Restrictions Other    Stability/Clinical Decision Making Evolving/Moderate complexity    Rehab Potential Good    PT Frequency 2x / week    PT Duration 6 weeks    PT Treatment/Interventions ADLs/Self Care Home Management;Cryotherapy;Electrical Stimulation;Ultrasound;Traction;Moist Heat;Therapeutic activities;Therapeutic exercise;Manual techniques;Passive range of motion;Dry needling;Spinal Manipulations    PT Next Visit Plan Continue with progressing core exercises and LE strengthening STW/M to R QL modalities as needed.    PT Home Exercise Plan see patient education section    Consulted and Agree with Plan of Care Patient           Patient will benefit from skilled therapeutic intervention in order to improve the following deficits and impairments:  Abnormal gait, Pain, Increased muscle spasms, Postural dysfunction, Decreased activity tolerance, Decreased range of motion  Visit Diagnosis: Chronic left-sided low back pain without sciatica  Abnormal posture     Problem List Patient Active Problem List   Diagnosis Date Noted  . Encounter for therapeutic drug monitoring 05/24/2019  . Coronary artery disease involving native coronary artery of native heart without angina pectoris 04/01/2019  . Spondylosis of cervical region without myelopathy or radiculopathy 07/10/2018  . Neck pain 07/10/2018  . Dyslipidemia 03/30/2017  . History of colonic polyps 05/17/2016  . Chronic anticoagulation - warfarin Afib 12/04/2015  . Prolapsed internal hemorrhoids, grade 3 12/04/2015  . BPH (benign prostatic hypertrophy) 04/02/2014  . Preventative health care 04/02/2014  . Prostate cancer screening 04/02/2014  . Vertebral artery syndrome 05/18/2013  . Orthostatic hypotension 04/19/2013  . Vertigo  04/19/2013  . H/O dizziness 03/20/2013  . Dizziness 03/06/2013  . CAD (coronary artery disease) 05/12/2011  . Atrial fibrillation ([place]) 05/12/2011  . Hyperlipidemia 05/12/2011  . Hypertension    Progress Note Reporting Period 04/03/20 to 06/03/20  See note below for Objective Data and Assessment of Progress/Goals.  Patient doing well with PT and progressing toward goals.  He feels traction is beginning to help.     Neal Oshea, [place]  MPT 06/03/2020, 11:40 AM  [facility], [place], [address] Phone: [phone]   Fax:  [phone]  Name: Angel LOPPNOW Sr. MRN: [id number] Date of Birth: 12-16-1933

## 2020-06-12 ENCOUNTER — Other Ambulatory Visit: Payer: Self-pay

## 2020-06-12 ENCOUNTER — Ambulatory Visit: Payer: Medicare HMO | Admitting: Physical Therapy

## 2020-06-12 DIAGNOSIS — G8929 Other chronic pain: Secondary | ICD-10-CM | POA: Diagnosis not present

## 2020-06-12 DIAGNOSIS — R293 Abnormal posture: Secondary | ICD-10-CM | POA: Diagnosis not present

## 2020-06-12 DIAGNOSIS — M545 Low back pain, unspecified: Secondary | ICD-10-CM | POA: Diagnosis not present

## 2020-06-12 NOTE — Therapy (Signed)
[facility] [place], [place], [address] Phone: [phone]   Fax:  [phone]  Physical Therapy Treatment  Patient Details  Name: Angel GEIGER Sr. MRN: [id number] Date of Birth: [birth year] Referring Provider (PT): Levy Pupa PA-C   Encounter Date: 06/12/2020   PT End of Session - 06/12/20 1308    Visit Number 11    Number of Visits 12    Date for PT Re-Evaluation 05/22/20    PT Start Time 0100    PT Stop Time 0147    PT Time Calculation (min) 47 min    Activity Tolerance Patient tolerated treatment well    Behavior During Therapy [facility] for tasks assessed/performed           Past Medical History:  Diagnosis Date   Adenomatous colon polyp    Allergic rhinitis    primarily seasonal   Atrial fibrillation ([place])    peri op afib initially, then dx'd with permanent a-fib 2014 (xarelto started, switched to coumadin for cost reasons)   Borderline diabetes 05/2015; 03/2017   2016 A1c 5.8%.  2018 A1c 6.0%.  12/2017 A1c 5.9%. 08/2019 a1c 6.0%. Fasting gluc > 126 once and then 125 on f/u (08/2019).   CAD (coronary artery disease)    CABG 2011   Chronic idiopathic thrombocytopenia (HCC)    very mild   Diverticulosis 2012 scope   pancolonic   GERD (gastroesophageal reflux disease)    Hearing impairment    AU; has hearing aids   Hemorrhoids    banding 12/2019   Hypercholesteremia    Hypertension    Pneumonia    Recurrent low back pain    DDD.  PT per [facility] as of 03/2020   Ulcerative esophagitis     Past Surgical History:  Procedure Laterality Date   CARDIOVASCULAR STRESS TEST  12/2012   Nuclear stress (exercise) testing: low risk scan   CATARACT EXTRACTION     COLONOSCOPY N/A 07/22/2016   Procedure: COLONOSCOPY;  Surgeon: Rogene Houston, MD;  [facility];  Service: Endoscopy;  Laterality: N/A;  1:55   COLONOSCOPY W/ POLYPECTOMY  X 4, most recent 07/22/16   2017: tubular adenoma  x 2 (recall 5 yrs--? age ?)   CORONARY ARTERY BYPASS GRAFT  2011   L - LAD, S - diag, S - PDA/LV branch 2011.  Nl EF   HEMORRHOID BANDING  12/2019   INGUINAL HERNIA REPAIR  1970s   right groin   POLYPECTOMY  07/22/2016   Procedure: POLYPECTOMY;  Surgeon: Rogene Houston, MD;  Location: [facility];  Service: Endoscopy;;  hepatic flexure,  sigmoid colon,    TONSILLECTOMY AND ADENOIDECTOMY  as a child   UPPER GI ENDOSCOPY  09/03/2010   hiatus hernia, no Barrett's esoph,esoph stricture dilated, gastric ulcers (h pylori neg)    There were no vitals filed for this visit.                      [facility] Adult PT Treatment/Exercise - 06/12/20 0001      Lumbar Exercises: Aerobic   Nustep Level 5 x 15 min UE/LE      Lumbar Exercises: Machines for Strengthening   Cybex Lumbar Extension 70# x 3 minutes.    Other Lumbar Machine Exercise Ab machine 70# x 3 minutes.      Lumbar Exercises: Standing   Row Strengthening;Both;Theraband;20 reps    Theraband Level (Row) Level 2 (Red)  Shoulder Extension Strengthening;Both;20 reps;Theraband    Theraband Level (Shoulder Extension) Level 2 (Red)    Other Standing Lumbar Exercises reviewd HEP      Traction   Type of Traction Lumbar    Min (lbs) 15    Max (lbs) 85    Hold Time 99    Rest Time 5    Time 15                       PT Long Term Goals - 06/12/20 1300      PT LONG TERM GOAL #1   Title Independent with a HEP.    Baseline Met 06/12/20    Time 6    Period Weeks    Status Achieved      PT LONG TERM GOAL #2   Title Perform ADL's with LBP level not > 3-4/10.    Baseline no pain reported 06/12/20    Time 6    Period Weeks    Status Achieved      PT LONG TERM GOAL #3   Title Eliminate radiation of pain into left buttock.    Baseline minimal and not often    Time 6    Period Weeks    Status Partially Met                 Plan - 06/12/20 1312    Clinical Impression Statement Patient  tolerated treatment well today. Patient has continued to report no pain and able to perform yard work and ADL's with no discomfort. Patient reported overall improvement and is doing HEP. Patient met LTG #1 and #2 with close to meeting remaining goal due to occasional "twinge" in buttock only.    Personal Factors and Comorbidities Comorbidity 1;Comorbidity 2    Comorbidities CAD, CABG, Inguinal hernia repair.    Examination-Activity Limitations Other    Examination-Participation Restrictions Other    Stability/Clinical Decision Making Evolving/Moderate complexity    Rehab Potential Good    PT Frequency 2x / week    PT Duration 6 weeks    PT Treatment/Interventions ADLs/Self Care Home Management;Cryotherapy;Electrical Stimulation;Ultrasound;Traction;Moist Heat;Therapeutic activities;Therapeutic exercise;Manual techniques;Passive range of motion;Dry needling;Spinal Manipulations    PT Next Visit Plan DC next visit    Consulted and Agree with Plan of Care Patient           Patient will benefit from skilled therapeutic intervention in order to improve the following deficits and impairments:  Abnormal gait, Pain, Increased muscle spasms, Postural dysfunction, Decreased activity tolerance, Decreased range of motion  Visit Diagnosis: Chronic left-sided low back pain without sciatica  Abnormal posture     Problem List Patient Active Problem List   Diagnosis Date Noted   Encounter for therapeutic drug monitoring 05/24/2019   Coronary artery disease involving native coronary artery of native heart without angina pectoris 04/01/2019   Spondylosis of cervical region without myelopathy or radiculopathy 07/10/2018   Neck pain 07/10/2018   Dyslipidemia 03/30/2017   History of colonic polyps 05/17/2016   Chronic anticoagulation - warfarin Afib 12/04/2015   Prolapsed internal hemorrhoids, grade 3 12/04/2015   BPH (benign prostatic hypertrophy) 04/02/2014   Preventative health care  04/02/2014   Prostate cancer screening 04/02/2014   Vertebral artery syndrome 05/18/2013   Orthostatic hypotension 04/19/2013   Vertigo 04/19/2013   H/O dizziness 03/20/2013   Dizziness 03/06/2013   CAD (coronary artery disease) 05/12/2011   Atrial fibrillation ([place]) 05/12/2011   Hyperlipidemia 05/12/2011   Hypertension  Phillips Climes, PTA 06/12/2020, 1:58 PM  [facility] [address] [place], [place], [address] Phone: [phone]   Fax:  [phone]  Name: Angel VASQUES Sr. MRN: [id number] Date of Birth: 19-Apr-1934

## 2020-06-17 ENCOUNTER — Ambulatory Visit: Payer: Medicare HMO | Admitting: Physical Therapy

## 2020-06-19 ENCOUNTER — Ambulatory Visit: Payer: Medicare HMO | Admitting: Physical Therapy

## 2020-06-19 ENCOUNTER — Ambulatory Visit (INDEPENDENT_AMBULATORY_CARE_PROVIDER_SITE_OTHER): Payer: Medicare HMO | Admitting: *Deleted

## 2020-06-19 DIAGNOSIS — I4821 Permanent atrial fibrillation: Secondary | ICD-10-CM | POA: Diagnosis not present

## 2020-06-19 DIAGNOSIS — Z5181 Encounter for therapeutic drug level monitoring: Secondary | ICD-10-CM

## 2020-06-19 DIAGNOSIS — I4819 Other persistent atrial fibrillation: Secondary | ICD-10-CM

## 2020-06-19 LAB — POCT INR: INR: 2.8 (ref 2.0–3.0)

## 2020-06-19 NOTE — Patient Instructions (Signed)
Continue warfarin 1 tablet daily except 1 1/2 tablets on Mondays and Wednesdays Recheck in 6 weeks

## 2020-08-12 ENCOUNTER — Other Ambulatory Visit: Payer: Self-pay

## 2020-08-12 ENCOUNTER — Ambulatory Visit (INDEPENDENT_AMBULATORY_CARE_PROVIDER_SITE_OTHER): Payer: Medicare HMO | Admitting: *Deleted

## 2020-08-12 DIAGNOSIS — I4819 Other persistent atrial fibrillation: Secondary | ICD-10-CM

## 2020-08-12 DIAGNOSIS — Z5181 Encounter for therapeutic drug level monitoring: Secondary | ICD-10-CM

## 2020-08-12 DIAGNOSIS — I4821 Permanent atrial fibrillation: Secondary | ICD-10-CM | POA: Diagnosis not present

## 2020-08-12 LAB — POCT INR: INR: 1.7 — AB (ref 2.0–3.0)

## 2020-08-12 NOTE — Patient Instructions (Signed)
Take warfarin 2 tablets tonight then resume 1 tablet daily except 1 1/2 tablets on Mondays and Wednesdays Recheck in 4 weeks

## 2020-08-19 ENCOUNTER — Other Ambulatory Visit: Payer: Self-pay | Admitting: Cardiology

## 2020-08-19 ENCOUNTER — Other Ambulatory Visit: Payer: Self-pay | Admitting: Family Medicine

## 2020-08-19 DIAGNOSIS — I482 Chronic atrial fibrillation, unspecified: Secondary | ICD-10-CM

## 2020-08-19 DIAGNOSIS — I2510 Atherosclerotic heart disease of native coronary artery without angina pectoris: Secondary | ICD-10-CM

## 2020-08-19 NOTE — Telephone Encounter (Signed)
RF request for flexeril LOV:03/07/20 Next ov: n/a Last written:10/03/19 (270,3)

## 2020-08-26 NOTE — Progress Notes (Unsigned)
Cardiology Office Note   Date:  08/27/2020   ID:  Angel Iranielbert W Gulino Sr., DOB 15-Aug-1933, MRN [id number]  PCP:  Jeoffrey MassedMcGowen, Philip H, MD  Cardiologist:   No primary care provider on file.   Chief Complaint  Patient presents with  . Belching      History of Present Illness: Angel IraniDelbert W Leighty Sr. is a 87 y.o. male who presents for follow up of CAD.  He has a history of CAD s/p CABG.  He has permanent atrial fib.   Since I last saw him he has been okay from cardiovascular standpoint although he is complaining of excessive gas that he associates with foods like fatty foods, coffee or tea medication fluids.  He does not think this is similar to his previous angina.  He has not had any chest pressure, neck or arm discomfort.  He can walk 2 miles on a treadmill at 2 and half miles per hour with a slight grade and not bring any with belching.  He has not had any chest pressure, neck or arm discomfort.  He is probably atrial fibrillation permanently but does not feel this.    Past Medical History:  Diagnosis Date  . Adenomatous colon polyp   . Allergic rhinitis    primarily seasonal  . Atrial fibrillation (HCC)    peri op afib initially, then dx'd with permanent a-fib 2014 (xarelto started, switched to coumadin for cost reasons)  . Borderline diabetes 05/2015; 03/2017   2016 A1c 5.8%.  2018 A1c 6.0%.  12/2017 A1c 5.9%. 08/2019 a1c 6.0%. Fasting gluc > 126 once and then 125 on f/u (08/2019).  [name]. CAD (coronary artery disease)    CABG 2011  . Chronic idiopathic thrombocytopenia (HCC)    very mild  . Diverticulosis 2012 scope   pancolonic  . GERD (gastroesophageal reflux disease)   . Hearing impairment    AU; has hearing aids  . Hemorrhoids    banding 12/2019  . Hypercholesteremia   . Hypertension   . Pneumonia   . Recurrent low back pain    DDD.  PT per [place] [facility] as of 03/2020  . Ulcerative esophagitis     Past Surgical History:  Procedure Laterality Date  . CARDIOVASCULAR STRESS TEST   12/2012   Nuclear stress (exercise) testing: low risk scan  . CATARACT EXTRACTION    . COLONOSCOPY N/A 07/22/2016   Procedure: COLONOSCOPY;  Surgeon: Malissa HippoNajeeb U Rehman, MD;  [facility];  Service: Endoscopy;  Laterality: N/A;  1:55  . COLONOSCOPY W/ POLYPECTOMY  X 4, most recent 07/22/16   2017: tubular adenoma x 2 (recall 5 yrs--? age ?)  . CORONARY ARTERY BYPASS GRAFT  2011   L - LAD, S - diag, S - PDA/LV branch 2011.  Nl EF  . HEMORRHOID BANDING  12/2019  . INGUINAL HERNIA REPAIR  1970s   right groin  . POLYPECTOMY  07/22/2016   Procedure: POLYPECTOMY;  Surgeon: Malissa HippoNajeeb U Rehman, MD;  [facility];  Service: Endoscopy;;  hepatic flexure,  sigmoid colon,   . TONSILLECTOMY AND ADENOIDECTOMY  as a child  . UPPER GI ENDOSCOPY  09/03/2010   hiatus hernia, no Barrett's esoph,esoph stricture dilated, gastric ulcers (h pylori neg)     Current Outpatient Medications  Medication Sig Dispense Refill  . acetaminophen (TYLENOL) 500 MG tablet Take 1,000 mg by mouth daily as needed for moderate pain or headache.    [name]. atorvastatin (LIPITOR) 40 MG tablet TAKE 1  TABLET (40 MG TOTAL) BY MOUTH DAILY. 90 tablet 0  . cetirizine (ZYRTEC) 10 MG tablet Take 10 mg by mouth as needed for allergies.     . cyclobenzaprine (FLEXERIL) 10 MG tablet TAKE 1/2 TO 1 TABLET THREE TIMES DAILY AS NEEDED FOR MUSCLE RELAXATION (Patient not taking: Reported on 08/27/2020) 270 tablet 3  . fluticasone (FLONASE) 50 MCG/ACT nasal spray Place 2 sprays into both nostrils daily.    [name] ipratropium (ATROVENT) 0.06 % nasal spray USE 2 SPRAYS IN EACH NOSTRIL THREE TIMES DAILY 105 mL 3  . metoprolol tartrate (LOPRESSOR) 25 MG tablet TAKE 1 TABLET TWICE DAILY 90 tablet 3  . Omega-3 Fatty Acids (FISH OIL PO) Take 360 mg by mouth every evening.    . warfarin (COUMADIN) 5 MG tablet TAKE 1 TABLET ON SUN, TUES, THURS, FRI, SAT AND TAKE 1 AND 1/2 TABLETS ON MON AND WED 100 tablet 0  . triamcinolone ointment (KENALOG) 0.1 %  Apply 1 application topically 2 (two) times daily as needed (eczema).  (Patient not taking: Reported on 08/27/2020)     No current facility-administered medications for this visit.    Allergies:   Sulfa antibiotics    ROS:  Please see the history of present illness.   Otherwise, review of systems are positive for none.   All other systems are reviewed and negative.    PHYSICAL EXAM: VS:  BP (!) 146/80   Pulse 91   Ht 5\' 8"  (1.727 m)   Wt 159 lb (72.1 kg)   BMI 24.18 kg/m  , BMI Body mass index is 24.18 kg/m. GENERAL:  Well appearing NECK:  No jugular venous distention, waveform within normal limits, carotid upstroke brisk and symmetric, no bruits, no thyromegaly LUNGS:  Clear to auscultation bilaterally CHEST:  Unremarkable HEART:  PMI not displaced or sustained,S1 and S2 within normal limits, no S3,  no clicks, no rubs, no murmurs, irregular ABD:  Flat, positive bowel sounds normal in frequency in pitch, no bruits, no rebound, no guarding, no midline pulsatile mass, no hepatomegaly, no splenomegaly EXT:  2 plus pulses throughout, no edema, no cyanosis no clubbing   EKG:  EKG is ordered today. The ekg ordered today demonstrates atrial fibrillation, rate 91, right bundle branch block, left anterior fascicular block.  Premature ectopic complexes.   Recent Labs: No results found for requested labs within last 8760 hours.    Lipid Panel    Component Value Date/Time   CHOL 139 08/10/2019 0840   CHOL 146 04/16/2019 1139   TRIG 99 08/10/2019 0840   HDL 42 08/10/2019 0840   HDL 53 04/16/2019 1139   CHOLHDL 3.3 08/10/2019 0840   VLDL 13.2 01/16/2018 0840   LDLCALC 79 08/10/2019 0840      Wt Readings from Last 3 Encounters:  08/27/20 159 lb (72.1 kg)  03/07/20 167 lb 12.8 oz (76.1 kg)  03/03/20 167 lb (75.8 kg)      Other studies Reviewed: Additional studies/ records that were reviewed today include:  Lbs. Review of the above records demonstrates:  Please see elsewhere  in the note.     ASSESSMENT AND PLAN:  ATRIAL FIBRILLATION: Mr. [name] has a CHA2DS2 - VASc score of 4 .     He tolerates anticoagulation.  No change in therapy.  I have instructed him to get his blood work done to check a CBC.  He will make an appointment with his primary provider to review this.  CAD:  I do  not think he has any anginal equivalent.  However, if he finds it even avoiding possible exacerbating foods or if he gets treatment he still has this gas I would have a low threshold for stress testing.   HTN:  The blood pressure is mildly elevated but this is very unusual.  No change in therapy.[name]   HYPERLIPIDEMIA:   LDL is 79 last year.  Of asked him to get a repeat lipid profile.  The goal will be an LDL less than 70.   ABNORMAL EKG:  He has conduction delays as above.  However, he has no symptoms related to this we have previously talked about this.     Current medicines are reviewed at length with the patient today.  The patient does not have concerns regarding medicines.  The following changes have been made:  no change  Labs/ tests ordered today include: None  Orders Placed This Encounter  Procedures  . EKG 12-Lead     Disposition:   FU with me in one year or sooner if he continues to have symptoms as above.   Signed, Minus Breeding, MD  08/27/2020 12:59 PM    [facility]

## 2020-08-27 ENCOUNTER — Encounter: Payer: Self-pay | Admitting: Cardiology

## 2020-08-27 ENCOUNTER — Ambulatory Visit: Payer: Medicare HMO | Admitting: Cardiology

## 2020-08-27 ENCOUNTER — Other Ambulatory Visit: Payer: Self-pay

## 2020-08-27 VITALS — BP 146/80 | HR 91 | Ht 68.0 in | Wt 159.0 lb

## 2020-08-27 DIAGNOSIS — I10 Essential (primary) hypertension: Secondary | ICD-10-CM | POA: Diagnosis not present

## 2020-08-27 DIAGNOSIS — E785 Hyperlipidemia, unspecified: Secondary | ICD-10-CM | POA: Diagnosis not present

## 2020-08-27 DIAGNOSIS — I2510 Atherosclerotic heart disease of native coronary artery without angina pectoris: Secondary | ICD-10-CM

## 2020-08-27 DIAGNOSIS — I482 Chronic atrial fibrillation, unspecified: Secondary | ICD-10-CM

## 2020-08-27 NOTE — Patient Instructions (Signed)
Medication Instructions:  The current medical regimen is effective;  continue present plan and medications.  *If you need a refill on your cardiac medications before your next appointment, please call your pharmacy*  Follow-Up: At [facility], you and your health needs are our priority.  As part of our continuing mission to provide you with exceptional heart care, we have created designated Provider Care Teams.  These Care Teams include your primary Cardiologist (physician) and Advanced Practice Providers (APPs -  Physician Assistants and Nurse Practitioners) who all work together to provide you with the care you need, when you need it.  We recommend signing up for the patient portal called "MyChart".  Sign up information is provided on this After Visit Summary.  MyChart is used to connect with patients for Virtual Visits (Telemedicine).  Patients are able to view lab/test results, encounter notes, upcoming appointments, etc.  Non-urgent messages can be sent to your provider as well.   To learn more about what you can do with MyChart, go to [email or web address].    Your next appointment:   12 month(s)  The format for your next appointment:   In Person  Provider:   James Hochrein, MD   Thank you for choosing [facility]!!     

## 2020-09-08 ENCOUNTER — Ambulatory Visit (INDEPENDENT_AMBULATORY_CARE_PROVIDER_SITE_OTHER): Payer: Medicare HMO | Admitting: Family Medicine

## 2020-09-08 ENCOUNTER — Other Ambulatory Visit: Payer: Self-pay

## 2020-09-08 ENCOUNTER — Encounter: Payer: Self-pay | Admitting: Family Medicine

## 2020-09-08 DIAGNOSIS — I4811 Longstanding persistent atrial fibrillation: Secondary | ICD-10-CM | POA: Diagnosis not present

## 2020-09-08 DIAGNOSIS — E7800 Pure hypercholesterolemia, unspecified: Secondary | ICD-10-CM | POA: Diagnosis not present

## 2020-09-08 DIAGNOSIS — E119 Type 2 diabetes mellitus without complications: Secondary | ICD-10-CM | POA: Diagnosis not present

## 2020-09-08 DIAGNOSIS — Z7901 Long term (current) use of anticoagulants: Secondary | ICD-10-CM

## 2020-09-08 MED ORDER — FLUTICASONE PROPIONATE 50 MCG/ACT NA SUSP: 2.0000 | Freq: Every day | NASAL | 3 refills | Status: DC

## 2020-09-08 MED ORDER — TRIAMCINOLONE ACETONIDE 0.1 % EX OINT: 1.0000 "application " | TOPICAL_OINTMENT | Freq: Two times a day (BID) | CUTANEOUS | 1 refills | Status: DC | PRN

## 2020-09-08 NOTE — Progress Notes (Signed)
OFFICE VISIT  09/08/2020  CC:  Chief Complaint  Angel Ward presents with  . Gas    Mainly in the abdomen x about 1 week, but feels better today. Had been taking Gas-X.    HPI:    Angel Ward is a 87 y.o. Caucasian male who presents for abdominal complaints. Recalls 1 week or so of diffuse abd and chest "gas", burping a lot and passing gas.  No abd pain or CP.  Appetite stable, no changes in diet.  Bowel habits unchanged---BMs fine with occ stool softener.  Feeling well today. Recent frustrations regarding bills from [facility], dispute, etc and he feels like all this was contributing to the way he was feeling.  He saw Dr. Percival Spanish 08/27/20 and all was stable and he was encourged to f/u with me about this.  He is able to exercise on a treadmilll w/out any CP or unusual SOB.  Labs 08/2019 (his last f/u with me) did show early DM 2 and he states he has been eating a good diabetic diet since that time. Home bp monitoring: always normal. He is compliant with atorva 40mg  qd.  ROS: no fevers, no CP, no SOB, no wheezing, no cough, no dizziness, no HAs, no rashes, no melena/hematochezia.  No polyuria or polydipsia.  No myalgias or arthralgias.  No focal weakness, paresthesias, or tremors.  No acute vision or hearing abnormalities. No n/v/d or abd pain.  No palpitations.     Past Medical History:  Diagnosis Date  . Adenomatous colon polyp   . Allergic rhinitis    primarily seasonal  . Atrial fibrillation ([place])    peri op afib initially, then dx'd with permanent a-fib 2014 (xarelto started, switched to coumadin for cost reasons)  . Borderline diabetes 05/2015; 03/2017   2016 A1c 5.8%.  2018 A1c 6.0%.  12/2017 A1c 5.9%. 08/2019 a1c 6.0%. Fasting gluc > 126 once and then 125 on f/u (08/2019).  [name] CAD (coronary artery disease)    CABG 2011  . Chronic idiopathic thrombocytopenia (HCC)    very mild  . Diverticulosis 2012 scope   pancolonic  . GERD (gastroesophageal reflux disease)   . Hearing impairment    AU;  has hearing aids  . Hemorrhoids    banding 12/2019  . Hypercholesteremia   . Hypertension   . Pneumonia   . Recurrent low back pain    DDD.  PT per [place] [facility] as of 03/2020  . Ulcerative esophagitis     Past Surgical History:  Procedure Laterality Date  . CARDIOVASCULAR STRESS TEST  12/2012   Nuclear stress (exercise) testing: low risk scan  . CATARACT EXTRACTION    . COLONOSCOPY N/A 07/22/2016   Procedure: COLONOSCOPY;  Surgeon: Rogene Houston, MD;  [facility];  Service: Endoscopy;  Laterality: N/A;  1:55  . COLONOSCOPY W/ POLYPECTOMY  X 4, most recent 07/22/16   2017: tubular adenoma x 2 (recall 5 yrs--? age ?)  . CORONARY ARTERY BYPASS GRAFT  2011   L - LAD, S - diag, S - PDA/LV branch 2011.  Nl EF  . HEMORRHOID BANDING  12/2019  . INGUINAL HERNIA REPAIR  1970s   right groin  . POLYPECTOMY  07/22/2016   Procedure: POLYPECTOMY;  Surgeon: Rogene Houston, MD;  [facility];  Service: Endoscopy;;  hepatic flexure,  sigmoid colon,   . TONSILLECTOMY AND ADENOIDECTOMY  as a child  . UPPER GI ENDOSCOPY  09/03/2010   hiatus hernia, no Barrett's esoph,esoph stricture dilated,  gastric ulcers (h pylori neg)    Outpatient Medications Prior to Visit  Medication Sig Dispense Refill  . acetaminophen (TYLENOL) 500 MG tablet Take 1,000 mg by mouth daily as needed for moderate pain or headache.    [name] atorvastatin (LIPITOR) 40 MG tablet TAKE 1 TABLET (40 MG TOTAL) BY MOUTH DAILY. 90 tablet 0  . cetirizine (ZYRTEC) 10 MG tablet Take 10 mg by mouth as needed for allergies.     [name] ipratropium (ATROVENT) 0.06 % nasal spray USE 2 SPRAYS IN EACH NOSTRIL THREE TIMES DAILY 105 mL 3  . metoprolol tartrate (LOPRESSOR) 25 MG tablet TAKE 1 TABLET TWICE DAILY 90 tablet 3  . Omega-3 Fatty Acids (FISH OIL PO) Take 360 mg by mouth every evening.    . warfarin (COUMADIN) 5 MG tablet TAKE 1 TABLET ON SUN, TUES, THURS, FRI, SAT AND TAKE 1 AND 1/2 TABLETS ON MON AND WED 100 tablet 0  .  cyclobenzaprine (FLEXERIL) 10 MG tablet TAKE 1/2 TO 1 TABLET THREE TIMES DAILY AS NEEDED FOR MUSCLE RELAXATION 270 tablet 3  . fluticasone (FLONASE) 50 MCG/ACT nasal spray Place 2 sprays into both nostrils daily.    [name] triamcinolone ointment (KENALOG) 0.1 % Apply 1 application topically 2 (two) times daily as needed (eczema).     No facility-administered medications prior to visit.    Allergies  Allergen Reactions  . Sulfa Antibiotics Itching    Cannot remember what kind of reaction    ROS As per HPI  PE: Vitals with BMI 09/08/2020 08/27/2020 03/07/2020  Height - 5\' 8"  5' 7.5"  Weight 161 lbs 10 oz 159 lbs 167 lbs 13 oz  BMI 24.58 40.34 74.25  Systolic 956 387 564  Diastolic 83 80 70  Pulse 85 91 67   Gen: Alert, well appearing.  Angel Ward is oriented to person, place, time, and situation. AFFECT: pleasant, lucid thought and speech. ENT: Ears: EACs clear, normal epithelium.  TMs with good light reflex and landmarks bilaterally.  Eyes: no injection, icteris, swelling, or exudate.  EOMI, PERRLA. Nose: no drainage or turbinate edema/swelling.  No injection or focal lesion.  Mouth: lips without lesion/swelling.  Oral mucosa pink and moist.  Dentition intact and without obvious caries or gingival swelling.  Oropharynx without erythema, exudate, or swelling.  Neck: supple/nontender.  No LAD, mass, or TM.  Carotid pulses 2+ bilaterally, without bruits. CV: Irreg irreg, rate 80s, no m/r/g.   LUNGS: CTA bilat, nonlabored resps, good aeration in all lung fields. ABD: soft, NT, ND, BS normal.  No hepatospenomegaly or mass.  No bruits. EXT: no clubbing, no cyanosis. !+ R LL pitting edema and trace L LL pitting edema.  Musculoskeletal: no joint swelling, erythema, warmth, or tenderness.  ROM of all joints intact. Skin - no sores or suspicious lesions or rashes or color changes   LABS:    Chemistry      Component Value Date/Time   NA 141 08/10/2019 0840   NA 142 04/16/2019 1139   K 4.4  08/10/2019 0840   CL 105 08/10/2019 0840   CO2 26 08/10/2019 0840   BUN 14 08/10/2019 0840   BUN 12 04/16/2019 1139   CREATININE 1.09 08/10/2019 0840      Component Value Date/Time   CALCIUM 9.8 08/10/2019 0840   ALKPHOS 71 04/16/2019 1139   AST 20 08/10/2019 0840   ALT 22 08/10/2019 0840   BILITOT 1.0 08/10/2019 0840   BILITOT 1.5 (H) 04/16/2019 1139     Lab Results  Component Value Date   WBC 6.9 08/10/2019   HGB 17.0 08/10/2019   HCT 49.8 08/10/2019   MCV 90.7 08/10/2019   PLT 149 08/10/2019   Lab Results  Component Value Date   HGBA1C 6.0 (H) 08/10/2019   Lab Results  Component Value Date   CHOL 139 08/10/2019   HDL 42 08/10/2019   LDLCALC 79 08/10/2019   TRIG 99 08/10/2019   CHOLHDL 3.3 08/10/2019   Lab Results  Component Value Date   INR 1.7 (A) 08/12/2020   INR 2.8 06/19/2020   INR 2.3 05/08/2020   Lab Results  Component Value Date   PSA 1.43 05/26/2015   PSA 1.01 03/20/2013   IMPRESSION AND PLAN:  1) "Gas"--seems to be resolved.  ? GER, but this doesn't explain his lower GI gassiness.  He feels like stress was contributing to his overall sense of "feeling out of sorts". Obs.  Consider trial of otc h2 blocker vs PPI if sx's return and persist again.  2) HTN: bp up here but consistently normal at home. Lytes/cr--future.  3) HLD: tolerating atorva 40mg  qd. Return for fasting lab appt: flp and hepatic panel.  4) Chronic atrial fibrillation: asymptomatic. Rate controlled with lopressor 25mg  bid. Anticoag with coumadin, INR's followed by coumadin clinic.  5) DM, diet controlled. Fasting gluc 131 and a1c 6.0% one year ago. Overdue for f/u a1c.   A1c and urine microalb/cr ordered for when he returns for fasting labs.  6) Preventative health care: Vaccines all UTD. No further prostate ca screening is indicated. Colon ca screening; hx of polyps, most recently 07/2016->next colonoscopy around 07/2021 if pt chooses to pursue further screening.  An  After Visit Summary was printed and given to the Angel Ward.  FOLLOW UP: Return in about 6 months (around 03/08/2021) for office visit for RCI in 6 mo;  fasting lab appt at pt's earliest convenience.  Signed:  Crissie Sickles, MD           09/08/2020

## 2020-09-09 ENCOUNTER — Telehealth: Payer: Self-pay

## 2020-09-09 NOTE — Telephone Encounter (Signed)
Pt has been scheduled fasting lab appt on 2/10. Voiced understanding regarding monthly INR monitoring at cardiology coumadin clinic.

## 2020-09-09 NOTE — Telephone Encounter (Signed)
-----   Message from Tammi Sou, MD sent at 09/09/2020  9:06 AM EST ----- Regarding: pt/inr Pls notify pt that I want him to come in for fasting lab appt like we planned (he already has this appt set up) but I DO still want him to continue to go to his cardiology coumadin clinic in [facility] for his monthly INR monitoring.  -thx

## 2020-09-10 ENCOUNTER — Ambulatory Visit (INDEPENDENT_AMBULATORY_CARE_PROVIDER_SITE_OTHER): Payer: Medicare HMO | Admitting: *Deleted

## 2020-09-10 DIAGNOSIS — Z5181 Encounter for therapeutic drug level monitoring: Secondary | ICD-10-CM

## 2020-09-10 DIAGNOSIS — I4821 Permanent atrial fibrillation: Secondary | ICD-10-CM | POA: Diagnosis not present

## 2020-09-10 DIAGNOSIS — I4819 Other persistent atrial fibrillation: Secondary | ICD-10-CM

## 2020-09-10 LAB — POCT INR: INR: 1.9 — AB (ref 2.0–3.0)

## 2020-09-10 NOTE — Patient Instructions (Signed)
Increase warfarin to 1 tablet daily except 1 1/2 tablets on Mondays, Wednesdays and Fridays Recheck in 4 weeks

## 2020-09-11 ENCOUNTER — Ambulatory Visit (INDEPENDENT_AMBULATORY_CARE_PROVIDER_SITE_OTHER): Payer: Medicare HMO

## 2020-09-11 ENCOUNTER — Other Ambulatory Visit: Payer: Self-pay

## 2020-09-11 DIAGNOSIS — I4811 Longstanding persistent atrial fibrillation: Secondary | ICD-10-CM | POA: Diagnosis not present

## 2020-09-11 DIAGNOSIS — E119 Type 2 diabetes mellitus without complications: Secondary | ICD-10-CM | POA: Diagnosis not present

## 2020-09-11 DIAGNOSIS — Z7901 Long term (current) use of anticoagulants: Secondary | ICD-10-CM

## 2020-09-11 DIAGNOSIS — E7800 Pure hypercholesterolemia, unspecified: Secondary | ICD-10-CM | POA: Diagnosis not present

## 2020-09-11 LAB — COMPREHENSIVE METABOLIC PANEL
ALT: 24 U/L (ref 0–53)
AST: 19 U/L (ref 0–37)
Albumin: 4.3 g/dL (ref 3.5–5.2)
Alkaline Phosphatase: 49 U/L (ref 39–117)
BUN: 12 mg/dL (ref 6–23)
CO2: 31 mEq/L (ref 19–32)
Calcium: 9.9 mg/dL (ref 8.4–10.5)
Chloride: 103 mEq/L (ref 96–112)
Creatinine, Ser: 0.92 mg/dL (ref 0.40–1.50)
GFR: 76.33 mL/min (ref >60.00)
Glucose, Bld: 103 mg/dL — ABNORMAL HIGH (ref 70–99)
Potassium: 4.5 mEq/L (ref 3.5–5.1)
Sodium: 140 mEq/L (ref 135–145)
Total Bilirubin: 1.5 mg/dL — ABNORMAL HIGH (ref 0.2–1.2)
Total Protein: 6.2 g/dL (ref 6.0–8.3)

## 2020-09-11 LAB — CBC WITH DIFFERENTIAL/PLATELET
Basophils Absolute: 0.1 10*3/uL (ref 0.0–0.1)
Basophils Relative: 1.3 % (ref 0.0–3.0)
Eosinophils Absolute: 0.3 10*3/uL (ref 0.0–0.7)
Eosinophils Relative: 5.4 % — ABNORMAL HIGH (ref 0.0–5.0)
HCT: 47.9 % (ref 39.0–52.0)
Hemoglobin: 16.3 g/dL (ref 13.0–17.0)
Lymphocytes Relative: 18 % (ref 12.0–46.0)
Lymphs Abs: 1 10*3/uL (ref 0.7–4.0)
MCHC: 34 g/dL (ref 30.0–36.0)
MCV: 92.2 fl (ref 78.0–100.0)
Monocytes Absolute: 0.4 10*3/uL (ref 0.1–1.0)
Monocytes Relative: 7.6 % (ref 3.0–12.0)
Neutro Abs: 3.9 10*3/uL (ref 1.4–7.7)
Neutrophils Relative %: 67.7 % (ref 43.0–77.0)
Platelets: 120 10*3/uL — ABNORMAL LOW (ref 150.0–400.0)
RBC: 5.19 Mil/uL (ref 4.22–5.81)
RDW: 13.6 % (ref 11.5–15.5)
WBC: 5.7 10*3/uL (ref 4.0–10.5)

## 2020-09-11 LAB — PROTIME-INR
INR: 2.3 ratio — ABNORMAL HIGH (ref 0.8–1.0)
Prothrombin Time: 25 s — ABNORMAL HIGH (ref 9.6–13.1)

## 2020-09-11 LAB — LIPID PANEL
Cholesterol: 117 mg/dL (ref 0–200)
HDL: 42.6 mg/dL (ref >39.00)
LDL Cholesterol: 59 mg/dL (ref 0–99)
NonHDL: 74.75
Total CHOL/HDL Ratio: 3
Triglycerides: 79 mg/dL (ref 0.0–149.0)
VLDL: 15.8 mg/dL (ref 0.0–40.0)

## 2020-09-11 LAB — HEMOGLOBIN A1C: Hgb A1c MFr Bld: 5.9 % (ref 4.6–6.5)

## 2020-09-12 LAB — MICROALBUMIN / CREATININE URINE RATIO
Creatinine,U: 125.4 mg/dL
Microalb Creat Ratio: 1.4 mg/g (ref 0.0–30.0)
Microalb, Ur: 1.8 mg/dL (ref 0.0–1.9)

## 2020-10-08 ENCOUNTER — Ambulatory Visit (INDEPENDENT_AMBULATORY_CARE_PROVIDER_SITE_OTHER): Payer: Medicare HMO | Admitting: *Deleted

## 2020-10-08 DIAGNOSIS — Z5181 Encounter for therapeutic drug level monitoring: Secondary | ICD-10-CM

## 2020-10-08 DIAGNOSIS — I4821 Permanent atrial fibrillation: Secondary | ICD-10-CM

## 2020-10-08 DIAGNOSIS — I4819 Other persistent atrial fibrillation: Secondary | ICD-10-CM | POA: Diagnosis not present

## 2020-10-08 LAB — POCT INR: INR: 2.3 (ref 2.0–3.0)

## 2020-10-08 NOTE — Patient Instructions (Signed)
Continue warfarin 1 tablet daily except 1 1/2 tablets on Mondays, Wednesdays and Fridays °Recheck in 4 weeks   °

## 2020-10-29 ENCOUNTER — Other Ambulatory Visit: Payer: Self-pay | Admitting: Cardiology

## 2020-10-29 DIAGNOSIS — I482 Chronic atrial fibrillation, unspecified: Secondary | ICD-10-CM

## 2020-10-29 DIAGNOSIS — I2510 Atherosclerotic heart disease of native coronary artery without angina pectoris: Secondary | ICD-10-CM

## 2020-11-05 ENCOUNTER — Ambulatory Visit (INDEPENDENT_AMBULATORY_CARE_PROVIDER_SITE_OTHER): Payer: Medicare HMO | Admitting: *Deleted

## 2020-11-05 DIAGNOSIS — I4821 Permanent atrial fibrillation: Secondary | ICD-10-CM | POA: Diagnosis not present

## 2020-11-05 DIAGNOSIS — I4819 Other persistent atrial fibrillation: Secondary | ICD-10-CM | POA: Diagnosis not present

## 2020-11-05 DIAGNOSIS — Z5181 Encounter for therapeutic drug level monitoring: Secondary | ICD-10-CM

## 2020-11-05 LAB — POCT INR: INR: 2 (ref 2.0–3.0)

## 2020-11-05 NOTE — Patient Instructions (Signed)
Continue warfarin 1 tablet daily except 1 1/2 tablets on Mondays, Wednesdays and Fridays °Recheck in 4 weeks   °

## 2020-12-03 ENCOUNTER — Ambulatory Visit (INDEPENDENT_AMBULATORY_CARE_PROVIDER_SITE_OTHER): Payer: Medicare HMO | Admitting: *Deleted

## 2020-12-03 DIAGNOSIS — Z5181 Encounter for therapeutic drug level monitoring: Secondary | ICD-10-CM | POA: Diagnosis not present

## 2020-12-03 DIAGNOSIS — I4819 Other persistent atrial fibrillation: Secondary | ICD-10-CM

## 2020-12-03 DIAGNOSIS — I4821 Permanent atrial fibrillation: Secondary | ICD-10-CM | POA: Diagnosis not present

## 2020-12-03 LAB — POCT INR: INR: 2 (ref 2.0–3.0)

## 2020-12-03 NOTE — Patient Instructions (Signed)
Take warfarin 2 tablets tonight then resume 1 tablet daily except 1 1/2 tablets on Mondays, Wednesdays and Fridays Recheck in 6 weeks

## 2020-12-19 ENCOUNTER — Other Ambulatory Visit: Payer: Self-pay

## 2021-01-01 ENCOUNTER — Telehealth: Payer: Self-pay

## 2021-01-01 MED ORDER — TRIAMCINOLONE ACETONIDE 0.1 % EX OINT: 1.0000 "application " | TOPICAL_OINTMENT | Freq: Two times a day (BID) | CUTANEOUS | 1 refills | Status: DC | PRN

## 2021-01-01 MED ORDER — FLUTICASONE PROPIONATE 50 MCG/ACT NA SUSP: 2.0000 | Freq: Every day | NASAL | 3 refills | Status: DC

## 2021-01-01 NOTE — Telephone Encounter (Signed)
Spoke with pt to advise refills sent to [facility]

## 2021-01-01 NOTE — Telephone Encounter (Signed)
Patient refill request  fluticasone (FLONASE) 50 MCG/ACT nasal spray [[id number]]   triamcinolone ointment (KENALOG) 0.1 % [[id number]]    Please send to [facility] per patient request.  [facility] -

## 2021-01-02 ENCOUNTER — Other Ambulatory Visit: Payer: Self-pay | Admitting: Cardiology

## 2021-01-02 DIAGNOSIS — I482 Chronic atrial fibrillation, unspecified: Secondary | ICD-10-CM

## 2021-01-02 DIAGNOSIS — I2510 Atherosclerotic heart disease of native coronary artery without angina pectoris: Secondary | ICD-10-CM

## 2021-01-14 ENCOUNTER — Ambulatory Visit (INDEPENDENT_AMBULATORY_CARE_PROVIDER_SITE_OTHER): Payer: Medicare HMO | Admitting: *Deleted

## 2021-01-14 DIAGNOSIS — I4819 Other persistent atrial fibrillation: Secondary | ICD-10-CM

## 2021-01-14 DIAGNOSIS — I4821 Permanent atrial fibrillation: Secondary | ICD-10-CM | POA: Diagnosis not present

## 2021-01-14 DIAGNOSIS — Z5181 Encounter for therapeutic drug level monitoring: Secondary | ICD-10-CM | POA: Diagnosis not present

## 2021-01-14 LAB — POCT INR: INR: 2.8 (ref 2.0–3.0)

## 2021-01-14 NOTE — Patient Instructions (Signed)
Continue warfarin 1 tablet daily except 1 1/2 tablets on Mondays, Wednesdays and Fridays Recheck in 6 weeks   

## 2021-01-29 ENCOUNTER — Other Ambulatory Visit: Payer: Self-pay | Admitting: Cardiology

## 2021-01-29 DIAGNOSIS — I2510 Atherosclerotic heart disease of native coronary artery without angina pectoris: Secondary | ICD-10-CM

## 2021-01-29 DIAGNOSIS — I482 Chronic atrial fibrillation, unspecified: Secondary | ICD-10-CM

## 2021-01-29 NOTE — Telephone Encounter (Signed)
Rx(s) sent to pharmacy electronically.  

## 2021-01-30 DIAGNOSIS — U071 COVID-19: Secondary | ICD-10-CM

## 2021-01-30 HISTORY — DX: COVID-19: U07.1

## 2021-02-13 ENCOUNTER — Other Ambulatory Visit: Payer: Self-pay

## 2021-02-13 ENCOUNTER — Encounter: Payer: Medicare HMO | Admitting: Family Medicine

## 2021-02-16 ENCOUNTER — Encounter: Payer: Self-pay | Admitting: Family Medicine

## 2021-02-16 ENCOUNTER — Ambulatory Visit (INDEPENDENT_AMBULATORY_CARE_PROVIDER_SITE_OTHER): Payer: Medicare HMO | Admitting: Family Medicine

## 2021-02-16 ENCOUNTER — Other Ambulatory Visit: Payer: Self-pay

## 2021-02-16 VITALS — BP 131/78 | HR 77 | Temp 97.4°F | Resp 16 | Ht 67.75 in | Wt 155.2 lb

## 2021-02-16 DIAGNOSIS — Z0000 Encounter for general adult medical examination without abnormal findings: Secondary | ICD-10-CM

## 2021-02-16 DIAGNOSIS — D693 Immune thrombocytopenic purpura: Secondary | ICD-10-CM

## 2021-02-16 DIAGNOSIS — E119 Type 2 diabetes mellitus without complications: Secondary | ICD-10-CM

## 2021-02-16 DIAGNOSIS — E7800 Pure hypercholesterolemia, unspecified: Secondary | ICD-10-CM | POA: Diagnosis not present

## 2021-02-16 LAB — CBC WITH DIFFERENTIAL/PLATELET
Basophils Absolute: 0.1 10*3/uL (ref 0.0–0.1)
Basophils Relative: 1 % (ref 0.0–3.0)
Eosinophils Absolute: 0.2 10*3/uL (ref 0.0–0.7)
Eosinophils Relative: 2.3 % (ref 0.0–5.0)
HCT: 44.7 % (ref 39.0–52.0)
Hemoglobin: 15.3 g/dL (ref 13.0–17.0)
Lymphocytes Relative: 13.4 % (ref 12.0–46.0)
Lymphs Abs: 1.2 10*3/uL (ref 0.7–4.0)
MCHC: 34.3 g/dL (ref 30.0–36.0)
MCV: 91.2 fl (ref 78.0–100.0)
Monocytes Absolute: 0.6 10*3/uL (ref 0.1–1.0)
Monocytes Relative: 7.4 % (ref 3.0–12.0)
Neutro Abs: 6.5 10*3/uL (ref 1.4–7.7)
Neutrophils Relative %: 75.9 % (ref 43.0–77.0)
Platelets: 172 10*3/uL (ref 150.0–400.0)
RBC: 4.9 Mil/uL (ref 4.22–5.81)
RDW: 13.6 % (ref 11.5–15.5)
WBC: 8.6 10*3/uL (ref 4.0–10.5)

## 2021-02-16 LAB — COMPREHENSIVE METABOLIC PANEL
ALT: 21 U/L (ref 0–53)
AST: 18 U/L (ref 0–37)
Albumin: 4.2 g/dL (ref 3.5–5.2)
Alkaline Phosphatase: 56 U/L (ref 39–117)
BUN: 11 mg/dL (ref 6–23)
CO2: 28 mEq/L (ref 19–32)
Calcium: 9.4 mg/dL (ref 8.4–10.5)
Chloride: 102 mEq/L (ref 96–112)
Creatinine, Ser: 0.83 mg/dL (ref 0.40–1.50)
GFR: 80.06 mL/min (ref >60.00)
Glucose, Bld: 104 mg/dL — ABNORMAL HIGH (ref 70–99)
Potassium: 4.5 mEq/L (ref 3.5–5.1)
Sodium: 138 mEq/L (ref 135–145)
Total Bilirubin: 1.5 mg/dL — ABNORMAL HIGH (ref 0.2–1.2)
Total Protein: 6 g/dL (ref 6.0–8.3)

## 2021-02-16 LAB — LIPID PANEL
Cholesterol: 108 mg/dL (ref 0–200)
HDL: 43.1 mg/dL (ref >39.00)
LDL Cholesterol: 51 mg/dL (ref 0–99)
NonHDL: 64.48
Total CHOL/HDL Ratio: 2
Triglycerides: 66 mg/dL (ref 0.0–149.0)
VLDL: 13.2 mg/dL (ref 0.0–40.0)

## 2021-02-16 LAB — HEMOGLOBIN A1C: Hgb A1c MFr Bld: 6.2 % (ref 4.6–6.5)

## 2021-02-16 NOTE — Progress Notes (Signed)
Office Note 02/16/2021  CC:  Chief Complaint  Patient presents with   Annual Exam    Pt is fasting   HPI:  Angel Spray Congrove Sr. is a 87 y.o. White male who is here for annual health maintenance exam and 5 mo f/q hyperlipidemia and diet controlled DM. He has chronic a-fib and is on coumadin therapy, followed by coumadin clinic.   A/P as of last visit: "1) "Gas"--seems to be resolved.  ? GER, but this doesn't explain his lower GI gassiness.  He feels like stress was contributing to his overall sense of "feeling out of sorts". Obs.  Consider trial of otc h2 blocker vs PPI if sx's return and persist again.   2) HTN: bp up here but consistently normal at home. Lytes/cr--future.   3) HLD: tolerating atorva 40mg  qd. Return for fasting lab appt: flp and hepatic panel.   4) Chronic atrial fibrillation: asymptomatic. Rate controlled with lopressor 25mg  bid. Anticoag with coumadin, INR's followed by coumadin clinic.   5) DM, diet controlled. Fasting gluc 131 and a1c 6.0% one year ago. Overdue for f/u a1c.   A1c and urine microalb/cr ordered for when he returns for fasting labs.   6) Preventative health care: Vaccines all UTD. No further prostate ca screening is indicated. Colon ca screening; hx of polyps, most recently 07/2016->next colonoscopy around 07/2021 if pt chooses to pursue further screening."  INTERIM HX: Feeling pretty well, just some mild aches/pains. Active around house and in yard.   Taking atorvastatin 40mg  qd w/out problem.  DM: eating low carb diet.    Past Medical History:  Diagnosis Date   Adenomatous colon polyp    Allergic rhinitis    primarily seasonal   Atrial fibrillation ([place])    peri op afib initially, then dx'd with permanent a-fib 2014 (xarelto started, switched to coumadin for cost reasons)   Borderline diabetes 05/2015; 03/2017   2016 A1c 5.8%.  2018 A1c 6.0%.  12/2017 A1c 5.9%. 08/2019 a1c 6.0%. Fasting gluc > 126 once and then 125 on f/u  (08/2019).   CAD (coronary artery disease)    CABG 2011   Chronic idiopathic thrombocytopenia ([facility])    very mild   Diverticulosis 2012 scope   pancolonic   GERD (gastroesophageal reflux disease)    Hearing impairment    AU; has hearing aids   Hemorrhoids    banding 12/2019   Hypercholesteremia    Hypertension    Pneumonia    Recurrent low back pain    DDD.  PT per [place] as of 03/2020   Ulcerative esophagitis     Past Surgical History:  Procedure Laterality Date   CARDIOVASCULAR STRESS TEST  12/2012   Nuclear stress (exercise) testing: low risk scan   CATARACT EXTRACTION     COLONOSCOPY N/A 07/22/2016   Procedure: COLONOSCOPY;  Surgeon: Rogene Houston, MD;  [facility];  Service: Endoscopy;  Laterality: N/A;  1:55   COLONOSCOPY W/ POLYPECTOMY  X 4, most recent 07/22/16   2017: tubular adenoma x 2 (recall 5 yrs--? age ?)   CORONARY ARTERY BYPASS GRAFT  2011   L - LAD, S - diag, S - PDA/LV branch 2011.  Nl EF   HEMORRHOID BANDING  12/2019   INGUINAL HERNIA REPAIR  1970s   right groin   POLYPECTOMY  07/22/2016   Procedure: POLYPECTOMY;  Surgeon: Rogene Houston, MD;  Location: [facility];  Service: Endoscopy;;  hepatic flexure,  sigmoid colon,  TONSILLECTOMY AND ADENOIDECTOMY  as a child   UPPER GI ENDOSCOPY  09/03/2010   hiatus hernia, no Barrett's esoph,esoph stricture dilated, gastric ulcers (h pylori neg)    Family History  Problem Relation Age of Onset   Stroke Mother    Pneumonia Father    Diabetes Father    Coronary artery disease Brother 23       stent   Diabetes Brother     Social History   Socioeconomic History   Marital status: Married    Spouse name: Not on file   Number of children: 5   Years of education: Not on file   Highest education level: Not on file  Occupational History   Occupation: Retired  Tobacco Use   Smoking status: Never   Smokeless tobacco: Never  Vaping Use   Vaping Use: Never used  Substance and Sexual  Activity   Alcohol use: No    Alcohol/week: 0.0 standard drinks   Drug use: No   Sexual activity: Not on file  Other Topics Concern   Not on file  Social History Narrative   Married, 5 children, 10 grandchildren.   Relocated to [place] from [place] in 2012 for warmer climate.  Orig from [place], [place].   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   Hobby: woodworking.   No T/A/Ds.   Exercise: treadmill and stationary bike--issue with knee temporarily got him out of routine--set to restart 03/2013.   Diet: prudent diet but nothing drastic.         Social Determinants of Health   Financial Resource Strain: Not on file  Food Insecurity: Not on file  Transportation Needs: Not on file  Physical Activity: Not on file  Stress: Not on file  Social Connections: Not on file  Intimate Partner Violence: Not on file    Outpatient Medications Prior to Visit  Medication Sig Dispense Refill   acetaminophen (TYLENOL) 500 MG tablet Take 1,000 mg by mouth daily as needed for moderate pain or headache.     atorvastatin (LIPITOR) 40 MG tablet TAKE 1 TABLET EVERY DAY 90 tablet 2   cetirizine (ZYRTEC) 10 MG tablet Take 10 mg by mouth as needed for allergies.      cyclobenzaprine (FLEXERIL) 10 MG tablet      fluticasone (FLONASE) 50 MCG/ACT nasal spray Place 2 sprays into both nostrils daily. 48 g 3   ipratropium (ATROVENT) 0.06 % nasal spray USE 2 SPRAYS IN EACH NOSTRIL THREE TIMES DAILY 105 mL 3   metoprolol tartrate (LOPRESSOR) 25 MG tablet TAKE 1 TABLET (25 MG TOTAL) BY MOUTH 2 (TWO) TIMES DAILY. 180 tablet 3   Omega-3 Fatty Acids (FISH OIL PO) Take 360 mg by mouth every evening.     triamcinolone ointment (KENALOG) 0.1 % Apply 1 application topically 2 (two) times daily as needed (eczema). 30 g 1   warfarin (COUMADIN) 5 MG tablet Take 1 tablet daily except 1 1/2 tablets on Mondays, Wednesdays and Fridays or as directed 100 tablet 3   No facility-administered medications prior to  visit.    Allergies  Allergen Reactions   Sulfa Antibiotics Itching    Cannot remember what kind of reaction    ROS Review of Systems  Constitutional:  Negative for appetite change, chills, fatigue and fever.  HENT:  Negative for congestion, dental problem, ear pain and sore throat.   Eyes:  Negative for discharge, redness and visual disturbance.  Respiratory:  Negative for cough, chest tightness, shortness of breath and  wheezing.   Cardiovascular:  Negative for chest pain, palpitations and leg swelling.  Gastrointestinal:  Negative for abdominal pain, blood in stool, diarrhea, nausea and vomiting.  Genitourinary:  Negative for difficulty urinating, dysuria, flank pain, frequency, hematuria and urgency.  Musculoskeletal:  Negative for arthralgias, back pain, joint swelling, myalgias and neck stiffness.  Skin:  Negative for pallor and rash.  Neurological:  Negative for dizziness, speech difficulty, weakness and headaches.  Hematological:  Negative for adenopathy. Does not bruise/bleed easily.  Psychiatric/Behavioral:  Negative for confusion and sleep disturbance. The patient is not nervous/anxious.    PE; Vitals with BMI 02/16/2021 09/08/2020 08/27/2020  Height 5' 7.75" - 5\' 8"   Weight 155 lbs 3 oz 161 lbs 10 oz 159 lbs  BMI 23.77 64.15 83.09  Systolic 407 680 881  Diastolic 78 83 80  Pulse 77 85 91   Gen: Alert, well appearing.  Patient is oriented to person, place, time, and situation. AFFECT: pleasant, lucid thought and speech. ENT: Ears: EACs clear, normal epithelium.  TMs with good light reflex and landmarks bilaterally.  Eyes: no injection, icteris, swelling, or exudate.  EOMI, PERRLA. Nose: no drainage or turbinate edema/swelling.  No injection or focal lesion.  Mouth: lips without lesion/swelling.  Oral mucosa pink and moist.  Dentition intact and without obvious caries or gingival swelling.  Oropharynx without erythema, exudate, or swelling.  Neck: supple/nontender.  No LAD,  mass, or TM.  Carotid pulses 2+ bilaterally, without bruits. CV: RRR, no m/r/g.   LUNGS: CTA bilat, nonlabored resps, good aeration in all lung fields. ABD: soft, NT, ND, BS normal.  No hepatospenomegaly or mass.  No bruits. EXT: no clubbing or cyanosis.  3+ bilat LL pitting edema, primarily in ankles. Pretibial freckling and scattered small non-inflamed varicosities and spider veins bilat.  Musculoskeletal: no joint swelling, erythema, warmth, or tenderness.  ROM of all joints intact. Skin - no sores or suspicious lesions or rashes or color changes   Pertinent labs:  Lab Results  Component Value Date   TSH 1.99 05/26/2015   Lab Results  Component Value Date   WBC 5.7 09/11/2020   HGB 16.3 09/11/2020   HCT 47.9 09/11/2020   MCV 92.2 09/11/2020   PLT 120.0 (L) 09/11/2020   Lab Results  Component Value Date   CREATININE 0.92 09/11/2020   BUN 12 09/11/2020   NA 140 09/11/2020   K 4.5 09/11/2020   CL 103 09/11/2020   CO2 31 09/11/2020   Lab Results  Component Value Date   ALT 24 09/11/2020   AST 19 09/11/2020   ALKPHOS 49 09/11/2020   BILITOT 1.5 (H) 09/11/2020   Lab Results  Component Value Date   CHOL 117 09/11/2020   Lab Results  Component Value Date   HDL 42.60 09/11/2020   Lab Results  Component Value Date   LDLCALC 59 09/11/2020   Lab Results  Component Value Date   TRIG 79.0 09/11/2020   Lab Results  Component Value Date   CHOLHDL 3 09/11/2020   Lab Results  Component Value Date   PSA 1.43 05/26/2015   PSA 1.01 03/20/2013    Lab Results  Component Value Date   HGBA1C 5.9 09/11/2020   ASSESSMENT AND PLAN:   1) DM 2, diet controlled. Hba1c today.  2) HLD: tolerating atorva 40 qd. Goal LDL <70. LDL was 59 in Feb 2022. FLP and hepatic panel today.  3) Chronic thrombocytopenia, has been stable long term, suspect ITP. Cbc today.  4) Health  maintenance exam: Reviewed age and gender appropriate health maintenance issues (prudent diet,  regular exercise, health risks of tobacco and excessive alcohol, use of seatbelts, fire alarms in home, use of sunscreen).  Also reviewed age and gender appropriate health screening as well as vaccine recommendations. Vaccines: ALL UTD. Labs: cbc, cmet, flp, a1c. Prostate ca screening: no further screening indicated. Colon ca screening: hx of polyps, most recently 07/2016->next colonoscopy around 07/2021 if pt chooses to pursue further screening-->as of now he does not want to pursue this.  An After Visit Summary was printed and given to the patient.  FOLLOW UP:  No follow-ups on file.  Signed:  Crissie Sickles, MD           02/16/2021

## 2021-02-16 NOTE — Patient Instructions (Signed)
Health Maintenance, Male Adopting a healthy lifestyle and getting preventive care are important in promoting health and wellness. Ask your health care provider about: The right schedule for you to have regular tests and exams. Things you can do on your own to prevent diseases and keep yourself healthy. What should I know about diet, weight, and exercise? Eat a healthy diet  Eat a diet that includes plenty of vegetables, fruits, low-fat dairy products, and lean protein. Do not eat a lot of foods that are high in solid fats, added sugars, or sodium.  Maintain a healthy weight Body mass index (BMI) is a measurement that can be used to identify possible weight problems. It estimates body fat based on height and weight. Your health care provider can help determine your BMI and help you achieve or maintain ahealthy weight. Get regular exercise Get regular exercise. This is one of the most important things you can do for your health. Most adults should: Exercise for at least 150 minutes each week. The exercise should increase your heart rate and make you sweat (moderate-intensity exercise). Do strengthening exercises at least twice a week. This is in addition to the moderate-intensity exercise. Spend less time sitting. Even light physical activity can be beneficial. Watch cholesterol and blood lipids Have your blood tested for lipids and cholesterol at 85 years of age, then havethis test every 5 years. You may need to have your cholesterol levels checked more often if: Your lipid or cholesterol levels are high. You are older than 85 years of age. You are at high risk for heart disease. What should I know about cancer screening? Many types of cancers can be detected early and may often be prevented. Depending on your health history and family history, you may need to have cancer screening at various ages. This may include screening for: Colorectal cancer. Prostate cancer. Skin cancer. Lung  cancer. What should I know about heart disease, diabetes, and high blood pressure? Blood pressure and heart disease High blood pressure causes heart disease and increases the risk of stroke. This is more likely to develop in people who have high blood pressure readings, are of African descent, or are overweight. Talk with your health care provider about your target blood pressure readings. Have your blood pressure checked: Every 3-5 years if you are 18-39 years of age. Every year if you are 40 years old or older. If you are between the ages of 65 and 75 and are a current or former smoker, ask your health care provider if you should have a one-time screening for abdominal aortic aneurysm (AAA). Diabetes Have regular diabetes screenings. This checks your fasting blood sugar level. Have the screening done: Once every three years after age 45 if you are at a normal weight and have a low risk for diabetes. More often and at a younger age if you are overweight or have a high risk for diabetes. What should I know about preventing infection? Hepatitis B If you have a higher risk for hepatitis B, you should be screened for this virus. Talk with your health care provider to find out if you are at risk forhepatitis B infection. Hepatitis C Blood testing is recommended for: Everyone born from 1945 through 1965. Anyone with known risk factors for hepatitis C. Sexually transmitted infections (STIs) You should be screened each year for STIs, including gonorrhea and chlamydia, if: You are sexually active and are younger than 85 years of age. You are older than 85 years of age   and your health care provider tells you that you are at risk for this type of infection. Your sexual activity has changed since you were last screened, and you are at increased risk for chlamydia or gonorrhea. Ask your health care provider if you are at risk. Ask your health care provider about whether you are at high risk for HIV.  Your health care provider may recommend a prescription medicine to help prevent HIV infection. If you choose to take medicine to prevent HIV, you should first get tested for HIV. You should then be tested every 3 months for as long as you are taking the medicine. Follow these instructions at home: Lifestyle Do not use any products that contain nicotine or tobacco, such as cigarettes, e-cigarettes, and chewing tobacco. If you need help quitting, ask your health care provider. Do not use street drugs. Do not share needles. Ask your health care provider for help if you need support or information about quitting drugs. Alcohol use Do not drink alcohol if your health care provider tells you not to drink. If you drink alcohol: Limit how much you have to 0-2 drinks a day. Be aware of how much alcohol is in your drink. In the [place] one drink equals one 12 oz bottle of beer (355 mL), one 5 oz glass of wine (148 mL), or one 1 oz glass of hard liquor (44 mL). General instructions Schedule regular health, dental, and eye exams. Stay current with your vaccines. Tell your health care provider if: You often feel depressed. You have ever been abused or do not feel safe at home. Summary Adopting a healthy lifestyle and getting preventive care are important in promoting health and wellness. Follow your health care provider's instructions about healthy diet, exercising, and getting tested or screened for diseases. Follow your health care provider's instructions on monitoring your cholesterol and blood pressure. This information is not intended to replace advice given to you by your health care provider. Make sure you discuss any questions you have with your healthcare provider. Document Revised: 07/12/2018 Document Reviewed: 07/12/2018 Elsevier Patient Education  2022 Elsevier Inc.  

## 2021-02-17 ENCOUNTER — Encounter: Payer: Self-pay | Admitting: Family Medicine

## 2021-02-25 ENCOUNTER — Ambulatory Visit (INDEPENDENT_AMBULATORY_CARE_PROVIDER_SITE_OTHER): Payer: Medicare HMO | Admitting: *Deleted

## 2021-02-25 ENCOUNTER — Other Ambulatory Visit: Payer: Self-pay

## 2021-02-25 DIAGNOSIS — Z5181 Encounter for therapeutic drug level monitoring: Secondary | ICD-10-CM | POA: Diagnosis not present

## 2021-02-25 DIAGNOSIS — I4819 Other persistent atrial fibrillation: Secondary | ICD-10-CM | POA: Diagnosis not present

## 2021-02-25 DIAGNOSIS — I4821 Permanent atrial fibrillation: Secondary | ICD-10-CM

## 2021-02-25 LAB — POCT INR: INR: 3.2 — AB (ref 2.0–3.0)

## 2021-02-25 NOTE — Patient Instructions (Signed)
Take warfarin 1/2 tablet tonight then resume 1 tablet daily except 1 1/2 tablets on Mondays, Wednesdays and Fridays Recheck in 4 weeks

## 2021-03-02 ENCOUNTER — Encounter: Payer: Self-pay | Admitting: Family Medicine

## 2021-03-02 ENCOUNTER — Other Ambulatory Visit: Payer: Self-pay

## 2021-03-02 ENCOUNTER — Ambulatory Visit (INDEPENDENT_AMBULATORY_CARE_PROVIDER_SITE_OTHER): Payer: Medicare HMO | Admitting: Family Medicine

## 2021-03-02 VITALS — BP 118/64 | HR 81 | Temp 97.4°F | Resp 16 | Ht 67.75 in | Wt 155.8 lb

## 2021-03-02 DIAGNOSIS — M7581 Other shoulder lesions, right shoulder: Secondary | ICD-10-CM

## 2021-03-02 DIAGNOSIS — M25511 Pain in right shoulder: Secondary | ICD-10-CM | POA: Diagnosis not present

## 2021-03-02 MED ORDER — METHYLPREDNISOLONE ACETATE 80 MG/ML IJ SUSP
80.0000 mg | Freq: Once | INTRAMUSCULAR | Status: AC
  Administered 2021-03-02: 80 mg via INTRAMUSCULAR

## 2021-03-02 NOTE — Progress Notes (Signed)
OFFICE VISIT  03/02/2021  CC:  Chief Complaint  Patient presents with   Shoulder Pain    Right; 2 weeks. Tried using biofreeze and extra strength tylenol   HPI:    Patient is a 87 y.o. Caucasian male who presents for right shoulder pain. Onset a few weeks ago, got much worse last week after pulling weed-wacker cord over and over. Hurts bad to reach out and up or back.  Holds hand over entire R shoulder, no radiating pain and no paresthesias.    I did subacromial steroid injection on R shoulder back in [id number] w/out complications He is on coumadin for long term anticoagulation for stroke prophylaxis for a-fib.  Coumadin managed by coumadin clinic, most recent INR was 3.2 on 02/25/21. Coumadin dose was adjusted a little.  Past Medical History:  Diagnosis Date   Adenomatous colon polyp    Allergic rhinitis    primarily seasonal   Atrial fibrillation ([place])    peri op afib initially, then dx'd with permanent a-fib 2014 (xarelto started, switched to coumadin for cost reasons)   Borderline diabetes 05/2015; 03/2017   2016 A1c 5.8%.  2018 A1c 6.0%.  12/2017 A1c 5.9%. 08/2019 a1c 6.0%. Fasting gluc > 126 once and then 125 on f/u (08/2019). 01/2021 a1c 6.2%   CAD (coronary artery disease)    CABG 2011   Chronic idiopathic thrombocytopenia ([place])    very mild   COVID-19 virus infection 01/30/2021   Diverticulosis 2012 scope   pancolonic   GERD (gastroesophageal reflux disease)    Hearing impairment    AU; has hearing aids   Hemorrhoids    banding 12/2019   Hypercholesteremia    Hypertension    Pneumonia    Recurrent low back pain    DDD.  PT per [facility] as of 03/2020   Ulcerative esophagitis     Past Surgical History:  Procedure Laterality Date   CARDIOVASCULAR STRESS TEST  12/2012   Nuclear stress (exercise) testing: low risk scan   CATARACT EXTRACTION     COLONOSCOPY N/A 07/22/2016   Procedure: COLONOSCOPY;  Surgeon: Rogene Houston, MD;  [facility];  Service: Endoscopy;   Laterality: N/A;  1:55   COLONOSCOPY W/ POLYPECTOMY  X 4, most recent 07/22/16   2017: tubular adenoma x 2 (recall 5 yrs--? age ?)   CORONARY ARTERY BYPASS GRAFT  2011   L - LAD, S - diag, S - PDA/LV branch 2011.  Nl EF   HEMORRHOID BANDING  12/2019   INGUINAL HERNIA REPAIR  1970s   right groin   POLYPECTOMY  07/22/2016   Procedure: POLYPECTOMY;  Surgeon: Rogene Houston, MD;  [facility];  Service: Endoscopy;;  hepatic flexure,  sigmoid colon,    TONSILLECTOMY AND ADENOIDECTOMY  as a child   UPPER GI ENDOSCOPY  09/03/2010   hiatus hernia, no Barrett's esoph,esoph stricture dilated, gastric ulcers (h pylori neg)    Outpatient Medications Prior to Visit  Medication Sig Dispense Refill   acetaminophen (TYLENOL) 500 MG tablet Take 1,000 mg by mouth daily as needed for moderate pain or headache.     atorvastatin (LIPITOR) 40 MG tablet TAKE 1 TABLET EVERY DAY 90 tablet 2   cetirizine (ZYRTEC) 10 MG tablet Take 10 mg by mouth as needed for allergies.      fluticasone (FLONASE) 50 MCG/ACT nasal spray Place 2 sprays into both nostrils daily. 48 g 3   ipratropium (ATROVENT) 0.06 % nasal spray USE 2 SPRAYS  IN EACH NOSTRIL THREE TIMES DAILY 105 mL 3   metoprolol tartrate (LOPRESSOR) 25 MG tablet TAKE 1 TABLET (25 MG TOTAL) BY MOUTH 2 (TWO) TIMES DAILY. 180 tablet 3   Omega-3 Fatty Acids (FISH OIL PO) Take 360 mg by mouth every evening.     triamcinolone ointment (KENALOG) 0.1 % Apply 1 application topically 2 (two) times daily as needed (eczema). 30 g 1   warfarin (COUMADIN) 5 MG tablet Take 1 tablet daily except 1 1/2 tablets on Mondays, Wednesdays and Fridays or as directed 100 tablet 3   No facility-administered medications prior to visit.    Allergies  Allergen Reactions   Sulfa Antibiotics Itching    Cannot remember what kind of reaction    ROS As per HPI  PE: Vitals with BMI 03/02/2021 02/16/2021 09/08/2020  Height 5' 7.75" 5' 7.75" -  Weight 155 lbs 13 oz 155 lbs 3 oz 161  lbs 10 oz  BMI 23.86 [id number]  Systolic [id number]  Diastolic 64 78 83  Pulse 81 77 85   Gen: Alert, well appearing.  Patient is oriented to person, place, time, and situation. AFFECT: pleasant, lucid thought and speech. R shoulder with some deep posterolateral tenderness, otherwise nontender. Supraspinatus test slight positive at 100 deg, unable to completely get to full aBduction. Resisted ER mild pain, same with resisted IR. +impingement sign.  LABS:    Chemistry      Component Value Date/Time   NA 138 02/16/2021 0928   NA 142 04/16/2019 1139   K 4.5 02/16/2021 0928   CL 102 02/16/2021 0928   CO2 28 02/16/2021 0928   BUN 11 02/16/2021 0928   BUN 12 04/16/2019 1139   CREATININE 0.83 02/16/2021 0928   CREATININE 1.09 08/10/2019 0840      Component Value Date/Time   CALCIUM 9.4 02/16/2021 0928   ALKPHOS 56 02/16/2021 0928   AST 18 02/16/2021 0928   ALT 21 02/16/2021 0928   BILITOT 1.5 (H) 02/16/2021 0928   BILITOT 1.5 (H) 04/16/2019 1139     Lab Results  Component Value Date   WBC 8.6 02/16/2021   HGB 15.3 02/16/2021   HCT 44.7 02/16/2021   MCV 91.2 02/16/2021   PLT 172.0 02/16/2021   Lab Results  Component Value Date   INR 3.2 (A) 02/25/2021   INR 2.8 01/14/2021   INR 2.0 12/03/2020   Lab Results  Component Value Date   HGBA1C 6.2 02/16/2021   IMPRESSION AND PLAN:  R shoulder pain, suspect some RC tendonitis, some prominent subscap signs. Discussed with pt that subacromial injection may not be as effective compared to last time in 2015 when he had more obvious supraspinatus issue.  He elected to go ahead and proceed with trial of steroid injection. Discussed slight inc risk of bleeding d/t being on coumadin, pt expressed understanding and wanted to proceed. Otherwise, take tylenol '1000mg'$  tid prn and we discussed some home exercises/stretches.   Procedure: Therapeutic R shoulder injection.  The patient's clinical condition is marked by substantial  pain and/or significant functional disability.  Other conservative therapy has not provided relief, is contraindicated, or not appropriate.  There is a reasonable likelihood that injection will significantly improve the patient's pain and/or functional disability. Consent obtained. Cleaned skin with alcohol swab, used posterolateral approach, Injected ml of '80mg'$ /ml depo medrol + 2  ml of 1% plain lidocaine into subacromial space without resistance.  No immediate complications.  Patient tolerated procedure well.  Post-injection care discussed,  including 20 min of icing 1-2 times in the next 4-8 hours, frequent non weight-bearing ROM exercises over the next few days, and general pain medication management.  An After Visit Summary was printed and given to the patient.  FOLLOW UP: Return if symptoms worsen or fail to improve.  Signed:  Crissie Sickles, MD           03/02/2021

## 2021-03-02 NOTE — Addendum Note (Signed)
Addended by: Deveron Furlong D on: 03/02/2021 04:38 PM   Modules accepted: Orders

## 2021-03-02 NOTE — Patient Instructions (Addendum)
Ice shoulder x 2mn when you get home and then again before bedtime. Do the range of motion exercises we discussed. See handout.Rotator Cuff Tendinitis  Rotator cuff tendinitis is inflammation of the tendons in the rotator cuff. Tendons are tough, cord-like bands that connect muscle to bone. The rotator cuff includes all of the muscles and tendons that connect the arm to the shoulder. The rotator cuff holds the head of the humerus, or the upper arm bone, in the cup of the shoulder blade (scapula). This condition can lead to a long-term or chronic tear. The tear may be partialor complete. What are the causes? This condition is usually caused by overusing the rotator cuff. What increases the risk? This condition is more likely to develop in athletes and workers who frequently use their shoulder or reach over their heads. This can include activities such as: Tennis. Baseball or softball. Swimming. Construction work. Painting. What are the signs or symptoms? Symptoms of this condition include: Pain that spreads (radiates) from the shoulder to the upper arm. Swelling and tenderness in front of the shoulder. Pain when reaching, pulling, or lifting the arm above the head. Pain when lowering the arm from above the head. Minor pain in the shoulder when resting. Increased pain in the shoulder at night. Difficulty placing the arm behind the back. How is this diagnosed? This condition is diagnosed with a physical exam and medical history. Tests may also be done, including: X-rays. MRI. Ultrasound. CT with or without contrast. How is this treated? Treatment for this condition depends on the severity of the condition. In less severe cases, treatment may include: Rest. This may be done with a sling that holds the shoulder still (immobilization). Your health care provider may also recommend avoiding activities that involve lifting your arm over your head. Icing the shoulder. Anti-inflammatory  medicines, such as aspirin or ibuprofen. In more severe cases, treatment may include: Physical therapy. Steroid injections. Surgery. Follow these instructions at home: If you have a sling: Wear the sling as told by your health care provider. Remove it only as told by your health care provider. Loosen it if your fingers tingle, become numb, or turn cold and blue. Keep it clean. If the sling is not waterproof: Do not let it get wet. Cover it with a watertight covering when you take a bath or shower. Managing pain, stiffness, and swelling  If directed, put ice on the injured area. To do this: If you have a removable sling, remove it as told by your health care provider. Put ice in a plastic bag. Place a towel between your skin and the bag. Leave the ice on for 20 minutes, 2-3 times a day. Move your fingers often to reduce stiffness and swelling. Raise (elevate) the injured area above the level of your heart while you are lying down. Find a comfortable sleeping position, or sleep in a recliner, if available.  Activity Rest your shoulder as told by your health care provider. Ask your health care provider when it is safe to drive if you have a sling on your arm. Return to your normal activities as told by your health care provider. Ask your health care provider what activities are safe for you. Do any exercises or stretches as told by your health care provider or physical therapist. If you do repetitive overhead tasks, take small breaks in between and include stretching exercises as told by your health care provider. General instructions Do not use any products that contain nicotine or  tobacco, such as cigarettes, e-cigarettes, and chewing tobacco. These can delay healing. If you need help quitting, ask your health care provider. Take over-the-counter and prescription medicines only as told by your health care provider. Keep all follow-up visits as told by your health care provider. This  is important. Contact a health care provider if: Your pain gets worse. You have new pain in your arm, hands, or fingers. Your pain is not relieved with medicine or does not get better after 6 weeks of treatment. You have crackling sensations when moving your shoulder in certain directions. You hear a snapping sound after using your shoulder, followed by severe pain and weakness. Get help right away if: Your arm, hand, or fingers are numb or tingling. Your arm, hand, or fingers are swollen or painful or they turn white or blue. Summary Rotator cuff tendinitis is inflammation of the tendons in the rotator cuff. Tendons are tough, cord-like bands that connect muscle to bone. This condition is usually caused by overusing the rotator cuff, which includes all of the muscles and tendons that connect the arm to the shoulder. This condition is more likely to develop in athletes and workers who frequently use their shoulder or reach over their heads. Treatment generally includes rest, anti-inflammatory medicines, and icing. In some cases, physical therapy and steroid injections may be needed. In severe cases, surgery may be needed. This information is not intended to replace advice given to you by your health care provider. Make sure you discuss any questions you have with your healthcare provider. Document Revised: 04/23/2019 Document Reviewed: 04/23/2019 Elsevier Patient Education  [place].

## 2021-03-24 ENCOUNTER — Ambulatory Visit (INDEPENDENT_AMBULATORY_CARE_PROVIDER_SITE_OTHER): Payer: Medicare HMO | Admitting: *Deleted

## 2021-03-24 DIAGNOSIS — I4821 Permanent atrial fibrillation: Secondary | ICD-10-CM

## 2021-03-24 DIAGNOSIS — I4819 Other persistent atrial fibrillation: Secondary | ICD-10-CM

## 2021-03-24 DIAGNOSIS — Z5181 Encounter for therapeutic drug level monitoring: Secondary | ICD-10-CM

## 2021-03-24 LAB — POCT INR: INR: 3.3 — AB (ref 2.0–3.0)

## 2021-03-24 NOTE — Patient Instructions (Signed)
Hold warfarin Wednesdays then decrease dose to 1 tablet daily except 1 1/2 tablets on Mondays and Fridays Recheck in 3 weeks

## 2021-04-15 ENCOUNTER — Other Ambulatory Visit: Payer: Self-pay

## 2021-04-15 ENCOUNTER — Ambulatory Visit (INDEPENDENT_AMBULATORY_CARE_PROVIDER_SITE_OTHER): Payer: Medicare HMO | Admitting: *Deleted

## 2021-04-15 DIAGNOSIS — I4821 Permanent atrial fibrillation: Secondary | ICD-10-CM | POA: Diagnosis not present

## 2021-04-15 DIAGNOSIS — I4819 Other persistent atrial fibrillation: Secondary | ICD-10-CM

## 2021-04-15 DIAGNOSIS — Z5181 Encounter for therapeutic drug level monitoring: Secondary | ICD-10-CM

## 2021-04-15 LAB — POCT INR: INR: 3.1 — AB (ref 2.0–3.0)

## 2021-04-15 NOTE — Patient Instructions (Signed)
Take warfarin 1/2 tablet on Thursday then resume 1 tablet daily except 1 1/2 tablets on Mondays and Fridays Recheck in 4 weeks

## 2021-04-23 ENCOUNTER — Encounter: Payer: Self-pay | Admitting: Family Medicine

## 2021-04-23 ENCOUNTER — Ambulatory Visit (INDEPENDENT_AMBULATORY_CARE_PROVIDER_SITE_OTHER): Payer: Medicare HMO | Admitting: Family Medicine

## 2021-04-23 ENCOUNTER — Other Ambulatory Visit: Payer: Self-pay

## 2021-04-23 VITALS — BP 129/77 | HR 82 | Temp 97.5°F | Ht 67.75 in | Wt 154.4 lb

## 2021-04-23 DIAGNOSIS — M25511 Pain in right shoulder: Secondary | ICD-10-CM | POA: Diagnosis not present

## 2021-04-23 DIAGNOSIS — M7521 Bicipital tendinitis, right shoulder: Secondary | ICD-10-CM

## 2021-04-23 DIAGNOSIS — Z23 Encounter for immunization: Secondary | ICD-10-CM | POA: Diagnosis not present

## 2021-04-23 DIAGNOSIS — M7581 Other shoulder lesions, right shoulder: Secondary | ICD-10-CM

## 2021-04-23 NOTE — Progress Notes (Signed)
OFFICE VISIT  04/23/2021  CC:  Chief Complaint  Patient presents with   Shoulder Pain    Right; would like injection   HPI:    Patient is a 87 y.o. Caucasian male who presents for right shoulder pain. Onset about 3 wks ago, gradually onset and worsening, anterior shoulder area, hurts the worst reaching down and back.  No preceding acute strain or injury.   I did subacromial steroid injection for same problem on 03/02/21 and he noted resolution for about a month.  Tylenol 1000 mg twice a day helping some.  Not waking him up at night.  Past Medical History:  Diagnosis Date   Adenomatous colon polyp    Allergic rhinitis    primarily seasonal   Atrial fibrillation ([place])    peri op afib initially, then dx'd with permanent a-fib 2014 (xarelto started, switched to coumadin for cost reasons)   Borderline diabetes 05/2015; 03/2017   2016 A1c 5.8%.  2018 A1c 6.0%.  12/2017 A1c 5.9%. 08/2019 a1c 6.0%. Fasting gluc > 126 once and then 125 on f/u (08/2019). 01/2021 a1c 6.2%   CAD (coronary artery disease)    CABG 2011   Chronic idiopathic thrombocytopenia ([place])    very mild   COVID-19 virus infection 01/30/2021   Diverticulosis 2012 scope   pancolonic   GERD (gastroesophageal reflux disease)    Hearing impairment    AU; has hearing aids   Hemorrhoids    banding 12/2019   Hypercholesteremia    Hypertension    Pneumonia    Recurrent low back pain    DDD.  PT per [facility] as of 03/2020   Ulcerative esophagitis     Past Surgical History:  Procedure Laterality Date   CARDIOVASCULAR STRESS TEST  12/2012   Nuclear stress (exercise) testing: low risk scan   CATARACT EXTRACTION     COLONOSCOPY N/A 07/22/2016   Procedure: COLONOSCOPY;  Surgeon: Rogene Houston, MD;  [facility];  Service: Endoscopy;  Laterality: N/A;  1:55   COLONOSCOPY W/ POLYPECTOMY  X 4, most recent 07/22/16   2017: tubular adenoma x 2 (recall 5 yrs--? age ?)   CORONARY ARTERY BYPASS GRAFT  2011   L - LAD, S -  diag, S - PDA/LV branch 2011.  Nl EF   HEMORRHOID BANDING  12/2019   INGUINAL HERNIA REPAIR  1970s   right groin   POLYPECTOMY  07/22/2016   Procedure: POLYPECTOMY;  Surgeon: Rogene Houston, MD;  [facility];  Service: Endoscopy;;  hepatic flexure,  sigmoid colon,    TONSILLECTOMY AND ADENOIDECTOMY  as a child   UPPER GI ENDOSCOPY  09/03/2010   hiatus hernia, no Barrett's esoph,esoph stricture dilated, gastric ulcers (h pylori neg)    Outpatient Medications Prior to Visit  Medication Sig Dispense Refill   acetaminophen (TYLENOL) 500 MG tablet Take 1,000 mg by mouth daily as needed for moderate pain or headache.     atorvastatin (LIPITOR) 40 MG tablet TAKE 1 TABLET EVERY DAY 90 tablet 2   cetirizine (ZYRTEC) 10 MG tablet Take 10 mg by mouth as needed for allergies.      fluticasone (FLONASE) 50 MCG/ACT nasal spray Place 2 sprays into both nostrils daily. 48 g 3   ipratropium (ATROVENT) 0.06 % nasal spray USE 2 SPRAYS IN EACH NOSTRIL THREE TIMES DAILY 105 mL 3   Menthol, Topical Analgesic, (BIOFREEZE ROLL-ON EX) Apply topically as needed.     metoprolol tartrate (LOPRESSOR) 25 MG tablet  TAKE 1 TABLET (25 MG TOTAL) BY MOUTH 2 (TWO) TIMES DAILY. 180 tablet 3   Omega-3 Fatty Acids (FISH OIL PO) Take 360 mg by mouth every evening.     triamcinolone ointment (KENALOG) 0.1 % Apply 1 application topically 2 (two) times daily as needed (eczema). 30 g 1   warfarin (COUMADIN) 5 MG tablet Take 1 tablet daily except 1 1/2 tablets on Mondays, Wednesdays and Fridays or as directed 100 tablet 3   No facility-administered medications prior to visit.    Allergies  Allergen Reactions   Sulfa Antibiotics Itching    Cannot remember what kind of reaction    ROS As per HPI  PE: Vitals with BMI 04/23/2021 03/02/2021 02/16/2021  Height 5' 7.75" 5' 7.75" 5' 7.75"  Weight 154 lbs 6 oz 155 lbs 13 oz 155 lbs 3 oz  BMI 23.65 37.16 96.78  Systolic 938 101 751  Diastolic 77 64 78  Pulse 82 81 77     Gen: Alert, well appearing.  Patient is oriented to person, place, time, and situation. AFFECT: pleasant, lucid thought and speech. R shoulder with mild TTP over biceps tendon in anterior shoulder area, also mild TTP around/under anterolateral acromion area on R.  No AC jt tenderness.   Supraspinatus testing elicits pain at 90 deg on R.  Hawkins neg. Speeds equivocal. Yergason's equivocal. Resisted IR and ER mild discomfort.  LABS:    Chemistry      Component Value Date/Time   NA 138 02/16/2021 0928   NA 142 04/16/2019 1139   K 4.5 02/16/2021 0928   CL 102 02/16/2021 0928   CO2 28 02/16/2021 0928   BUN 11 02/16/2021 0928   BUN 12 04/16/2019 1139   CREATININE 0.83 02/16/2021 0928   CREATININE 1.09 08/10/2019 0840      Component Value Date/Time   CALCIUM 9.4 02/16/2021 0928   ALKPHOS 56 02/16/2021 0928   AST 18 02/16/2021 0928   ALT 21 02/16/2021 0928   BILITOT 1.5 (H) 02/16/2021 0928   BILITOT 1.5 (H) 04/16/2019 1139     Lab Results  Component Value Date   WBC 8.6 02/16/2021   HGB 15.3 02/16/2021   HCT 44.7 02/16/2021   MCV 91.2 02/16/2021   PLT 172.0 02/16/2021   Lab Results  Component Value Date   INR 3.1 (A) 04/15/2021   INR 3.3 (A) 03/24/2021   INR 3.2 (A) 02/25/2021    IMPRESSION AND PLAN:  Recurrent acute R shoulder pain. Exam findings support anteromedial aspect of RC pain as well as biceps tendon pain. Good response to subacromial steroid injection about 7 wks ago. I told pt it is too early to inject again. He is hesitant to do PT b/c of bad past experience when he got PT for his low back. We'll have Dr. Delorise Jackson in sports med evaluate him and see what he recommends. In the meantime, continue tylenol 1000mg  bid.  An After Visit Summary was printed and given to the patient.  FOLLOW UP: Return for as needed.  Signed:  Crissie Sickles, MD           04/23/2021

## 2021-05-01 ENCOUNTER — Ambulatory Visit (INDEPENDENT_AMBULATORY_CARE_PROVIDER_SITE_OTHER): Payer: Medicare HMO

## 2021-05-01 DIAGNOSIS — Z0000 Encounter for general adult medical examination without abnormal findings: Secondary | ICD-10-CM

## 2021-05-01 NOTE — Progress Notes (Signed)
Subjective:   Angel W Betton Sr. is a 87 y.o. male who presents for Medicare Annual/Subsequent preventive examination.  I connected with  Angel Norwood Castagnola Sr. on 05/01/21 by an audio only telemedicine application and verified that I am speaking with the correct person using two identifiers.   I discussed the limitations, risks, security and privacy concerns of performing an evaluation and management service by telephone and the availability of in person appointments. I also discussed with the patient that there may be a patient responsible charge related to this service. The patient expressed understanding and verbally consented to this telephonic visit.  Location of Patient: Home Location of Provider: Office  List any persons and their role that are participating in the visit with the patient.    Review of Systems    Defer to PCP       Objective:    There were no vitals filed for this visit. There is no height or weight on file to calculate BMI.  Advanced Directives 04/03/2020 07/19/2018 11/29/2017 11/16/2016 07/22/2016 07/14/2015 07/14/2015  Does Patient Have a Medical Advance Directive? No No Yes No No No No  Type of Advance Directive - Public librarian;Living will - - - -  Copy of [place] in Chart? - - No - copy requested - - - -  Would patient like information on creating a medical advance directive? - - No - Patient declined No - Patient declined No - Patient declined No - patient declined information No - patient declined information    Current Medications (verified) Outpatient Encounter Medications as of 05/01/2021  Medication Sig   acetaminophen (TYLENOL) 500 MG tablet Take 1,000 mg by mouth daily as needed for moderate pain or headache.   atorvastatin (LIPITOR) 40 MG tablet TAKE 1 TABLET EVERY DAY   cetirizine (ZYRTEC) 10 MG tablet Take 10 mg by mouth as needed for allergies.    fluticasone (FLONASE) 50 MCG/ACT nasal spray Place 2  sprays into both nostrils daily.   ipratropium (ATROVENT) 0.06 % nasal spray USE 2 SPRAYS IN EACH NOSTRIL THREE TIMES DAILY   Menthol, Topical Analgesic, (BIOFREEZE ROLL-ON EX) Apply topically as needed.   metoprolol tartrate (LOPRESSOR) 25 MG tablet TAKE 1 TABLET (25 MG TOTAL) BY MOUTH 2 (TWO) TIMES DAILY.   Omega-3 Fatty Acids (FISH OIL PO) Take 360 mg by mouth every evening.   triamcinolone ointment (KENALOG) 0.1 % Apply 1 application topically 2 (two) times daily as needed (eczema).   warfarin (COUMADIN) 5 MG tablet Take 1 tablet daily except 1 1/2 tablets on Mondays, Wednesdays and Fridays or as directed   No facility-administered encounter medications on file as of 05/01/2021.    Allergies (verified) Sulfa antibiotics   History: Past Medical History:  Diagnosis Date   Adenomatous colon polyp    Allergic rhinitis    primarily seasonal   Atrial fibrillation ([place])    peri op afib initially, then dx'd with permanent a-fib 2014 (xarelto started, switched to coumadin for cost reasons)   Borderline diabetes 05/2015; 03/2017   2016 A1c 5.8%.  2018 A1c 6.0%.  12/2017 A1c 5.9%. 08/2019 a1c 6.0%. Fasting gluc > 126 once and then 125 on f/u (08/2019). 01/2021 a1c 6.2%   CAD (coronary artery disease)    CABG 2011   Chronic idiopathic thrombocytopenia ([place])    very mild   COVID-19 virus infection 01/30/2021   Diverticulosis 2012 scope   pancolonic   GERD (gastroesophageal reflux disease)  Hearing impairment    AU; has hearing aids   Hemorrhoids    banding 12/2019   Hypercholesteremia    Hypertension    Pneumonia    Recurrent low back pain    DDD.  PT per [facility] as of 03/2020   Ulcerative esophagitis    Past Surgical History:  Procedure Laterality Date   CARDIOVASCULAR STRESS TEST  12/2012   Nuclear stress (exercise) testing: low risk scan   CATARACT EXTRACTION     COLONOSCOPY N/A 07/22/2016   Procedure: COLONOSCOPY;  Surgeon: Rogene Houston, MD;  [facility];   Service: Endoscopy;  Laterality: N/A;  1:55   COLONOSCOPY W/ POLYPECTOMY  X 4, most recent 07/22/16   2017: tubular adenoma x 2 (recall 5 yrs--? age ?)   CORONARY ARTERY BYPASS GRAFT  2011   L - LAD, S - diag, S - PDA/LV branch 2011.  Nl EF   HEMORRHOID BANDING  12/2019   INGUINAL HERNIA REPAIR  1970s   right groin   POLYPECTOMY  07/22/2016   Procedure: POLYPECTOMY;  Surgeon: Rogene Houston, MD;  [facility];  Service: Endoscopy;;  hepatic flexure,  sigmoid colon,    TONSILLECTOMY AND ADENOIDECTOMY  as a child   UPPER GI ENDOSCOPY  09/03/2010   hiatus hernia, no Barrett's esoph,esoph stricture dilated, gastric ulcers (h pylori neg)   Family History  Problem Relation Age of Onset   Stroke Mother    Pneumonia Father    Diabetes Father    Coronary artery disease Brother 26       stent   Diabetes Brother    Social History   Socioeconomic History   Marital status: Married    Spouse name: Not on file   Number of children: 5   Years of education: Not on file   Highest education level: Not on file  Occupational History   Occupation: Retired  Tobacco Use   Smoking status: Never   Smokeless tobacco: Never  Vaping Use   Vaping Use: Never used  Substance and Sexual Activity   Alcohol use: No    Alcohol/week: 0.0 standard drinks   Drug use: No   Sexual activity: Not on file  Other Topics Concern   Not on file  Social History Narrative   Married, 5 children, 10 grandchildren.   Relocated to [place] from [place] in 2012 for warmer climate.  Orig from [place], [place].   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   Hobby: woodworking.   No T/A/Ds.   Exercise: treadmill and stationary bike--issue with knee temporarily got him out of routine--set to restart 03/2013.   Diet: prudent diet but nothing drastic.         Social Determinants of Health   Financial Resource Strain: Not on file  Food Insecurity: Not on file  Transportation Needs: Not on file   Physical Activity: Not on file  Stress: Not on file  Social Connections: Not on file    Tobacco Counseling Counseling given: Not Answered   Clinical Intake:                 Diabetic?yes         Activities of Daily Living No flowsheet data found.  Patient Care Team: Tammi Sou, MD as PCP - General (Family Medicine) Minus Breeding, MD as Consulting Physician (Cardiology) Katy Apo, MD as Consulting Physician (Ophthalmology) Vaughan Basta, Rona Ravens, NP (Inactive) as Nurse Practitioner (Gastroenterology) Rogene Houston, MD as Consulting  Physician (Gastroenterology) Gatha Mayer, MD as Consulting Physician (Gastroenterology) Levy Pupa, PA-C as Physician Assistant (Orthopedic Surgery)  Indicate any recent Medical Services you may have received from other than [facility] providers in the past year (date may be approximate).     Assessment:   This is a routine wellness examination for Angel Ward.  Hearing/Vision screen No results found.  Dietary issues and exercise activities discussed:     Goals Addressed   None   Depression Screen PHQ 2/9 Scores 09/08/2020 08/07/2019 11/29/2017 11/16/2016 09/25/2015 07/14/2015 06/05/2015  PHQ - 2 Score 0 0 0 0 0 0 0  Exception Documentation - - - - - Other- indicate reason in comment box -    Fall Risk Fall Risk  09/08/2020 08/07/2019 11/29/2017 11/16/2016 09/25/2015  Falls in the past year? 0 0 No No No  Number falls in past yr: 0 0 - - -  Injury with Fall? 0 0 - - -  Risk for fall due to : - - - - -  Follow up - Falls evaluation completed - - -    FALL RISK PREVENTION PERTAINING TO THE HOME:  Any stairs in or around the home? Yes  If so, are there any without handrails? Yes  Home free of loose throw rugs in walkways, pet beds, electrical cords, etc? Yes  Adequate lighting in your home to reduce risk of falls? Yes   ASSISTIVE DEVICES UTILIZED TO PREVENT FALLS:  Life alert? No  Use of a cane, walker or w/c? No  Grab  bars in the bathroom? No  Shower chair or bench in shower? Yes  Elevated toilet seat or a handicapped toilet?  No  TIMED UP AND GO:  Was the test performed? No .  Length of time to ambulate 10 feet: n/a sec.    Cognitive Function: MMSE - Mini Mental State Exam 11/29/2017  Orientation to time 5  Orientation to Place 5  Registration 3  Attention/ Calculation 5  Recall 2  Language- name 2 objects 2  Language- repeat 1  Language- follow 3 step command 3  Language- read & follow direction 1  Write a sentence 1  Copy design 1  Total score 29        Immunizations Immunization History  Administered Date(s) Administered   Fluad Quad(high Dose 65+) 04/26/2019, 05/14/2020, 04/23/2021   Influenza, High Dose Seasonal PF 05/26/2015, 05/05/2016, 05/13/2017, 04/07/2018   Influenza,inj,Quad PF,6+ Mos 04/19/2013, 06/10/2014   Moderna Sars-Covid-2 Vaccination 08/14/2019, 09/14/2019, 06/05/2020   Pneumococcal Conjugate-13 04/02/2014   Pneumococcal Polysaccharide-23 01/30/2009   Tdap 04/02/2014    TDAP status: Up to date  Flu Vaccine status: Up to date  Pneumococcal vaccine status: Up to date  Covid-19 vaccine status: Completed vaccines  Qualifies for Shingles Vaccine? Yes   Zostavax completed Yes  Shingrix Completed?: No.    Education has been provided regarding the importance of this vaccine. Patient has been advised to call insurance company to determine out of pocket expense if they have not yet received this vaccine. Advised may also receive vaccine at local pharmacy or Health Dept. Verbalized acceptance and understanding.  Screening Tests Health Maintenance  Topic Date Due   Zoster Vaccines- Shingrix (1 of 2) Never done   COVID-19 Vaccine (4 - Booster for Moderna series) 05/09/2021 (Originally 08/28/2020)   URINE MICROALBUMIN  09/11/2021   TETANUS/TDAP  04/02/2024   INFLUENZA VACCINE  Completed   HPV VACCINES  Aged Out    Health Maintenance  Health Maintenance Due  Topic Date Due   Zoster Vaccines- Shingrix (1 of 2) Never done    Colorectal cancer screening: No longer required.   Lung Cancer Screening: (Low Dose CT Chest recommended if Age 83-80 years, 30 pack-year currently smoking OR have quit w/in 15years.) does not qualify.   Lung Cancer Screening Referral: n/a  Additional Screening:  Hepatitis C Screening: does not qualify; Completed n/a  Vision Screening: Recommended annual ophthalmology exams for early detection of glaucoma and other disorders of the eye. Is the patient up to date with their annual eye exam?  Yes  Who is the provider or what is the name of the office in which the patient attends annual eye exams? Dr. Royal Piedra   If pt is not established with a provider, would they like to be referred to a provider to establish care?  N/a .   Dental Screening: Recommended annual dental exams for proper oral hygiene  Community Resource Referral / Chronic Care Management: CRR required this visit?  No   CCM required this visit?  No      Plan:     I have personally reviewed and noted the following in the patient's chart:   Medical and social history Use of alcohol, tobacco or illicit drugs  Current medications and supplements including opioid prescriptions. Patient is not currently taking opioid prescriptions. Functional ability and status Nutritional status Physical activity Advanced directives List of other physicians Hospitalizations, surgeries, and ER visits in previous 12 months Vitals Screenings to include cognitive, depression, and falls Referrals and appointments  In addition, I have reviewed and discussed with patient certain preventive protocols, quality metrics, and best practice recommendations. A written personalized care plan for preventive services as well as general preventive health recommendations were provided to patient.     Kavin Leech, Progress   05/01/2021   Nurse Notes: Non face to face time 24  minutes.  Angel Ward , Thank you for taking time to come for your Medicare Wellness Visit. I appreciate your ongoing commitment to your health goals. Please review the following plan we discussed and let me know if I can assist you in the future.   These are the goals we discussed:  Goals      Increase physical activity     Increase activity by walking more.      patient     To maintain current activity.         This is a list of the screening recommended for you and due dates:  Health Maintenance  Topic Date Due   Zoster (Shingles) Vaccine (1 of 2) Never done   COVID-19 Vaccine (4 - Booster for Moderna series) 05/09/2021*   Urine Protein Check  09/11/2021   Tetanus Vaccine  04/02/2024   Flu Shot  Completed   HPV Vaccine  Aged Out  *Topic was postponed. The date shown is not the original due date.

## 2021-05-13 ENCOUNTER — Ambulatory Visit (INDEPENDENT_AMBULATORY_CARE_PROVIDER_SITE_OTHER): Payer: Medicare HMO | Admitting: *Deleted

## 2021-05-13 DIAGNOSIS — I4819 Other persistent atrial fibrillation: Secondary | ICD-10-CM

## 2021-05-13 DIAGNOSIS — Z5181 Encounter for therapeutic drug level monitoring: Secondary | ICD-10-CM | POA: Diagnosis not present

## 2021-05-13 DIAGNOSIS — I4821 Permanent atrial fibrillation: Secondary | ICD-10-CM

## 2021-05-13 LAB — POCT INR: INR: 2.5 (ref 2.0–3.0)

## 2021-05-13 NOTE — Patient Instructions (Signed)
Continue warfarin 1 tablet daily except 1 1/2 tablets on Mondays and Fridays Recheck in 4 weeks 

## 2021-05-14 DIAGNOSIS — Z23 Encounter for immunization: Secondary | ICD-10-CM | POA: Diagnosis not present

## 2021-05-18 ENCOUNTER — Other Ambulatory Visit: Payer: Self-pay

## 2021-05-18 ENCOUNTER — Ambulatory Visit (INDEPENDENT_AMBULATORY_CARE_PROVIDER_SITE_OTHER): Payer: Medicare HMO

## 2021-05-18 ENCOUNTER — Ambulatory Visit (INDEPENDENT_AMBULATORY_CARE_PROVIDER_SITE_OTHER): Payer: Medicare HMO | Admitting: Sports Medicine

## 2021-05-18 DIAGNOSIS — M25511 Pain in right shoulder: Secondary | ICD-10-CM

## 2021-05-18 DIAGNOSIS — G8929 Other chronic pain: Secondary | ICD-10-CM | POA: Diagnosis not present

## 2021-05-18 NOTE — Assessment & Plan Note (Signed)
This is a very pleasant 87 year old male, he has a long history of pain in his right shoulder, historically has done well with subacromial injections with the most recent of which only gave about a month of relief, his pain is localized at the joint line, he does have significant discomfort with external rotation, as well as abduction and external rotation. Moderate gelling. He likely has some osteoarthritis in the shoulder in addition to rotator cuff dysfunction. Today we did an injection into the glenohumeral joint with ultrasound guidance, I would like some x-rays, and I will give him some shoulder conditioning. Return to see me in about 6 weeks.

## 2021-05-18 NOTE — Progress Notes (Signed)
    Procedures performed today:    Procedure: Real-time Ultrasound Guided injection of the right glenohumeral joint Device: Samsung HS60  Verbal informed consent obtained.  Time-out conducted.  Noted no overlying erythema, induration, or other signs of local infection.  Skin prepped in a sterile fashion.  Local anesthesia: Topical Ethyl chloride.  With sterile technique and under real time ultrasound guidance: Noted arthritic joint with mild effusion, 1 cc Kenalog 40, 2 cc lidocaine, 2 cc bupivacaine injected easily Completed without difficulty  Advised to call if fevers/chills, erythema, induration, drainage, or persistent bleeding.  Images permanently stored and available for review in PACS.  Impression: Technically successful ultrasound guided injection.  Independent interpretation of notes and tests performed by another provider:   None.  Brief History, Exam, Impression, and Recommendations:    Chronic right shoulder pain This is a very pleasant 87 year old male, he has a long history of pain in his right shoulder, historically has done well with subacromial injections with the most recent of which only gave about a month of relief, his pain is localized at the joint line, he does have significant discomfort with external rotation, as well as abduction and external rotation. Moderate gelling. He likely has some osteoarthritis in the shoulder in addition to rotator cuff dysfunction. Today we did an injection into the glenohumeral joint with ultrasound guidance, I would like some x-rays, and I will give him some shoulder conditioning. Return to see me in about 6 weeks.    ___________________________________________ Gwen Her. Dianah Field, M.D., ABFM., CAQSM. Primary Care and [place] Instructor of [place] of [facility] of Medicine

## 2021-06-10 ENCOUNTER — Ambulatory Visit (INDEPENDENT_AMBULATORY_CARE_PROVIDER_SITE_OTHER): Payer: Medicare HMO | Admitting: *Deleted

## 2021-06-10 DIAGNOSIS — Z5181 Encounter for therapeutic drug level monitoring: Secondary | ICD-10-CM | POA: Diagnosis not present

## 2021-06-10 DIAGNOSIS — I4821 Permanent atrial fibrillation: Secondary | ICD-10-CM

## 2021-06-10 DIAGNOSIS — I4819 Other persistent atrial fibrillation: Secondary | ICD-10-CM

## 2021-06-10 LAB — POCT INR: INR: 2.4 (ref 2.0–3.0)

## 2021-06-10 NOTE — Patient Instructions (Signed)
Continue warfarin 1 tablet daily except 1 1/2 tablets on Mondays and Fridays Recheck in 4 weeks 

## 2021-06-29 ENCOUNTER — Other Ambulatory Visit: Payer: Self-pay

## 2021-06-29 ENCOUNTER — Ambulatory Visit (INDEPENDENT_AMBULATORY_CARE_PROVIDER_SITE_OTHER): Payer: Medicare HMO | Admitting: Sports Medicine

## 2021-06-29 DIAGNOSIS — M25511 Pain in right shoulder: Secondary | ICD-10-CM | POA: Diagnosis not present

## 2021-06-29 DIAGNOSIS — G8929 Other chronic pain: Secondary | ICD-10-CM

## 2021-06-29 DIAGNOSIS — M47812 Spondylosis without myelopathy or radiculopathy, cervical region: Secondary | ICD-10-CM

## 2021-06-29 NOTE — Assessment & Plan Note (Signed)
This is a pleasant 87 year old male, he has chronic right shoulder pain, historically has done well with subacromial injections in the past, the last of which only gave him a month of relief, pain is at the joint line, moderate gelling, pain worse with external rotation, abduction. We suspected a glenohumeral joint pain generator and injected this at the last visit and returns today for the most part pain-free. Continue conditioning, return as needed for this.

## 2021-06-29 NOTE — Progress Notes (Signed)
    Procedures performed today:    None.  Independent interpretation of notes and tests performed by another provider:   None.  Brief History, Exam, Impression, and Recommendations:    Chronic right shoulder pain This is a pleasant 87 year old male, he has chronic right shoulder pain, historically has done well with subacromial injections in the past, the last of which only gave him a month of relief, pain is at the joint line, moderate gelling, pain worse with external rotation, abduction. We suspected a glenohumeral joint pain generator and injected this at the last visit and returns today for the most part pain-free. Continue conditioning, return as needed for this.  Spondylosis of cervical region without myelopathy or radiculopathy Known cervical spondylosis without radiculopathy, adding cervical spine conditioning, if not significantly better in 6 weeks we will proceed with additional imaging and likely injections.    ___________________________________________ Gwen Her. Dianah Field, M.D., ABFM., CAQSM. Primary Care and [place] Instructor of [place] of [facility] of Medicine

## 2021-06-29 NOTE — Assessment & Plan Note (Signed)
Known cervical spondylosis without radiculopathy, adding cervical spine conditioning, if not significantly better in 6 weeks we will proceed with additional imaging and likely injections.

## 2021-07-08 ENCOUNTER — Ambulatory Visit (INDEPENDENT_AMBULATORY_CARE_PROVIDER_SITE_OTHER): Payer: Medicare HMO | Admitting: *Deleted

## 2021-07-08 DIAGNOSIS — Z5181 Encounter for therapeutic drug level monitoring: Secondary | ICD-10-CM | POA: Diagnosis not present

## 2021-07-08 DIAGNOSIS — I4819 Other persistent atrial fibrillation: Secondary | ICD-10-CM | POA: Diagnosis not present

## 2021-07-08 DIAGNOSIS — I4821 Permanent atrial fibrillation: Secondary | ICD-10-CM

## 2021-07-08 LAB — POCT INR: INR: 2.6 (ref 2.0–3.0)

## 2021-07-08 NOTE — Patient Instructions (Signed)
Continue warfarin 1 tablet daily except 1 1/2 tablets on Mondays and Fridays Recheck in 5 weeks 

## 2021-08-12 ENCOUNTER — Other Ambulatory Visit: Payer: Self-pay

## 2021-08-12 ENCOUNTER — Ambulatory Visit (INDEPENDENT_AMBULATORY_CARE_PROVIDER_SITE_OTHER): Payer: Medicare HMO | Admitting: *Deleted

## 2021-08-12 DIAGNOSIS — Z7901 Long term (current) use of anticoagulants: Secondary | ICD-10-CM

## 2021-08-12 DIAGNOSIS — I4821 Permanent atrial fibrillation: Secondary | ICD-10-CM | POA: Diagnosis not present

## 2021-08-12 DIAGNOSIS — Z5181 Encounter for therapeutic drug level monitoring: Secondary | ICD-10-CM

## 2021-08-12 DIAGNOSIS — I4819 Other persistent atrial fibrillation: Secondary | ICD-10-CM | POA: Diagnosis not present

## 2021-08-12 LAB — POCT INR: INR: 2.9 (ref 2.0–3.0)

## 2021-08-12 NOTE — Patient Instructions (Signed)
Continue warfarin 1 tablet daily except 1 1/2 tablets on Mondays and Fridays Recheck in 6 weeks 

## 2021-08-14 ENCOUNTER — Other Ambulatory Visit: Payer: Self-pay | Admitting: Family Medicine

## 2021-08-18 DIAGNOSIS — Z0100 Encounter for examination of eyes and vision without abnormal findings: Secondary | ICD-10-CM | POA: Diagnosis not present

## 2021-08-18 DIAGNOSIS — E7800 Pure hypercholesterolemia, unspecified: Secondary | ICD-10-CM | POA: Diagnosis not present

## 2021-08-18 DIAGNOSIS — H35371 Puckering of macula, right eye: Secondary | ICD-10-CM | POA: Diagnosis not present

## 2021-08-18 DIAGNOSIS — H5200 Hypermetropia, unspecified eye: Secondary | ICD-10-CM | POA: Diagnosis not present

## 2021-09-08 DIAGNOSIS — R9431 Abnormal electrocardiogram [ECG] [EKG]: Secondary | ICD-10-CM | POA: Insufficient documentation

## 2021-09-08 NOTE — Progress Notes (Signed)
Cardiology Office Note   Date:  09/09/2021   ID:  Angel Exon Sr., DOB [birth year], MRN [id number]  PCP:  Tammi Sou, MD  Cardiologist:   None   Chief Complaint  Patient presents with   Atrial Fibrillation      History of Present Illness: Angel HUR Sr. is a 88 y.o. male who presents for follow up of CAD.  He has a history of CAD s/p CABG.  He has permanent atrial fib.   Since I last saw him he has had no new cardiovascular complaints.  He does yard work.  He is not on his treadmill as much as I would like.  The patient denies any new symptoms such as chest discomfort, neck or arm discomfort. There has been no new shortness of breath, PND or orthopnea. There have been no reported palpitations, presyncope or syncope.  He is having some shoulder problems and might need to have right shoulder surgery but has not decided yet.   Past Medical History:  Diagnosis Date   Adenomatous colon polyp    Allergic rhinitis    primarily seasonal   Atrial fibrillation ([place])    peri op afib initially, then dx'd with permanent a-fib 2014 (xarelto started, switched to coumadin for cost reasons)   Borderline diabetes 05/2015; 03/2017   2016 A1c 5.8%.  2018 A1c 6.0%.  12/2017 A1c 5.9%. 08/2019 a1c 6.0%. Fasting gluc > 126 once and then 125 on f/u (08/2019). 01/2021 a1c 6.2%   CAD (coronary artery disease)    CABG 2011   Chronic idiopathic thrombocytopenia ([place])    very mild   COVID-19 virus infection 01/30/2021   Diverticulosis 2012 scope   pancolonic   GERD (gastroesophageal reflux disease)    Hearing impairment    AU; has hearing aids   Hemorrhoids    banding 12/2019   Hypercholesteremia    Hypertension    Pneumonia    Recurrent low back pain    DDD.  PT per [facility] as of 03/2020   Ulcerative esophagitis     Past Surgical History:  Procedure Laterality Date   CARDIOVASCULAR STRESS TEST  12/2012   Nuclear stress (exercise) testing: low risk scan   CATARACT EXTRACTION      COLONOSCOPY N/A 07/22/2016   Procedure: COLONOSCOPY;  Surgeon: Rogene Houston, MD;  Location: [facility];  Service: Endoscopy;  Laterality: N/A;  1:55   COLONOSCOPY W/ POLYPECTOMY  X 4, most recent 07/22/16   2017: tubular adenoma x 2 (recall 5 yrs--? age ?)   CORONARY ARTERY BYPASS GRAFT  2011   L - LAD, S - diag, S - PDA/LV branch 2011.  Nl EF   HEMORRHOID BANDING  12/2019   INGUINAL HERNIA REPAIR  1970s   right groin   POLYPECTOMY  07/22/2016   Procedure: POLYPECTOMY;  Surgeon: Rogene Houston, MD;  Location: [facility];  Service: Endoscopy;;  hepatic flexure,  sigmoid colon,    TONSILLECTOMY AND ADENOIDECTOMY  as a child   UPPER GI ENDOSCOPY  09/03/2010   hiatus hernia, no Barrett's esoph,esoph stricture dilated, gastric ulcers (h pylori neg)     Current Outpatient Medications  Medication Sig Dispense Refill   acetaminophen (TYLENOL) 500 MG tablet Take 1,000 mg by mouth daily as needed for moderate pain or headache.     atorvastatin (LIPITOR) 40 MG tablet TAKE 1 TABLET EVERY DAY 90 tablet 2   cetirizine (ZYRTEC) 10 MG tablet Take 10 mg by  mouth as needed for allergies.      fluticasone (FLONASE) 50 MCG/ACT nasal spray Place 2 sprays into both nostrils daily. 48 g 3   ipratropium (ATROVENT) 0.06 % nasal spray USE 2 SPRAYS IN EACH NOSTRIL THREE TIMES DAILY 105 mL 3   Menthol, Topical Analgesic, (BIOFREEZE ROLL-ON EX) Apply topically as needed.     metoprolol tartrate (LOPRESSOR) 25 MG tablet TAKE 1 TABLET (25 MG TOTAL) BY MOUTH 2 (TWO) TIMES DAILY. 180 tablet 3   Omega-3 Fatty Acids (FISH OIL PO) Take 360 mg by mouth every evening.     triamcinolone ointment (KENALOG) 0.1 % APPLY 1 APPLICATION TOPICALLY 2 (TWO) TIMES DAILY AS NEEDED (ECZEMA). 30 g 1   warfarin (COUMADIN) 5 MG tablet Take 1 tablet daily except 1 1/2 tablets on Mondays, Wednesdays and Fridays or as directed 100 tablet 3   No current facility-administered medications for this visit.    Allergies:   Sulfa  antibiotics    ROS:  Please see the history of present illness.   Otherwise, review of systems are positive for shoulder pain right .   All other systems are reviewed and negative.    PHYSICAL EXAM: VS:  BP (!) 152/74 (BP Location: Left Arm, Patient Position: Sitting, Cuff Size: Normal)    Pulse 70    Wt 151 lb (68.5 kg)    SpO2 98%    BMI 23.13 kg/m  , BMI Body mass index is 23.13 kg/m. GENERAL:  Well appearing NECK:  No jugular venous distention, waveform within normal limits, carotid upstroke brisk and symmetric, no bruits, no thyromegaly LUNGS:  Clear to auscultation bilaterally CHEST:  Well healed sternotomy scar. HEART:  PMI not displaced or sustained,S1 and S2 within normal limits, no S3,  no clicks, no rubs, no murmurs, irregular ABD:  Flat, positive bowel sounds normal in frequency in pitch, no bruits, no rebound, no guarding, no midline pulsatile mass, no hepatomegaly, no splenomegaly EXT:  2 plus pulses throughout, mild leg  edema, no cyanosis no clubbing   EKG:  EKG is  ordered today. The ekg ordered today demonstrates atrial fibrillation, rate 70, right bundle branch block, left anterior fascicular block.  Premature ectopic complexes.   Recent Labs: 02/16/2021: ALT 21; BUN 11; Creatinine, Ser 0.83; Hemoglobin 15.3; Platelets 172.0; Potassium 4.5; Sodium 138    Lipid Panel    Component Value Date/Time   CHOL 108 02/16/2021 0928   CHOL 146 04/16/2019 1139   TRIG 66.0 02/16/2021 0928   HDL 43.10 02/16/2021 0928   HDL 53 04/16/2019 1139   CHOLHDL 2 02/16/2021 0928   VLDL 13.2 02/16/2021 0928   LDLCALC 51 02/16/2021 0928   LDLCALC 79 08/10/2019 0840      Wt Readings from Last 3 Encounters:  09/09/21 151 lb (68.5 kg)  04/23/21 154 lb 6.4 oz (70 kg)  03/02/21 155 lb 12.8 oz (70.7 kg)      Other studies Reviewed: Additional studies/ records that were reviewed today include:  Labs. Review of the above records demonstrates:  Please see elsewhere in the note.      ASSESSMENT AND PLAN:  ATRIAL FIBRILLATION: Mr. Angel Kean Dorminey Sr. has a CHA2DS2 - VASc score of 4 .  He tolerates anticoagulation.  He has good rate control.  He is up-to-date with labs.  No change in therapy.  CAD:   The patient has no new sypmtoms.  No further cardiovascular testing is indicated.  We will continue with aggressive risk reduction and meds  as listed.  HTN:  The blood pressure is elevated but he says at home it is in the systolics 919T.  No change in therapy.    HYPERLIPIDEMIA:   LDL is 51, HDL 43.  No change in therapy.   ABNORMAL EKG:   He has conduction abnormalities but no presyncope or syncope.  No change in therapy.    Current medicines are reviewed at length with the patient today.  The patient does not have concerns regarding medicines.  The following changes have been made: None  Labs/ tests ordered today include: None  Orders Placed This Encounter  Procedures   EKG 12-Lead    Disposition:   FU with me in 1 year   Signed, Minus Breeding, MD  09/09/2021 10:33 AM    [place]

## 2021-09-09 ENCOUNTER — Ambulatory Visit (INDEPENDENT_AMBULATORY_CARE_PROVIDER_SITE_OTHER): Payer: Medicare HMO | Admitting: Cardiology

## 2021-09-09 ENCOUNTER — Other Ambulatory Visit: Payer: Self-pay

## 2021-09-09 ENCOUNTER — Encounter: Payer: Self-pay | Admitting: Cardiology

## 2021-09-09 VITALS — BP 152/74 | HR 70 | Wt 151.0 lb

## 2021-09-09 DIAGNOSIS — I482 Chronic atrial fibrillation, unspecified: Secondary | ICD-10-CM

## 2021-09-09 DIAGNOSIS — E785 Hyperlipidemia, unspecified: Secondary | ICD-10-CM

## 2021-09-09 DIAGNOSIS — I10 Essential (primary) hypertension: Secondary | ICD-10-CM

## 2021-09-09 DIAGNOSIS — R9431 Abnormal electrocardiogram [ECG] [EKG]: Secondary | ICD-10-CM

## 2021-09-09 NOTE — Patient Instructions (Signed)
Medication Instructions:  The current medical regimen is effective;  continue present plan and medications.  *If you need a refill on your cardiac medications before your next appointment, please call your pharmacy*  Follow-Up: At [facility], you and your health needs are our priority.  As part of our continuing mission to provide you with exceptional heart care, we have created designated Provider Care Teams.  These Care Teams include your primary Cardiologist (physician) and Advanced Practice Providers (APPs -  Physician Assistants and Nurse Practitioners) who all work together to provide you with the care you need, when you need it.  We recommend signing up for the patient portal called "MyChart".  Sign up information is provided on this After Visit Summary.  MyChart is used to connect with patients for Virtual Visits (Telemedicine).  Patients are able to view lab/test results, encounter notes, upcoming appointments, etc.  Non-urgent messages can be sent to your provider as well.   To learn more about what you can do with MyChart, go to [email or web address].    Your next appointment:   1 year(s)  The format for your next appointment:   In Person  Provider:   James Hochrein, MD   Thank you for choosing [facility]!!    

## 2021-09-11 ENCOUNTER — Other Ambulatory Visit: Payer: Self-pay | Admitting: Cardiology

## 2021-09-11 DIAGNOSIS — I482 Chronic atrial fibrillation, unspecified: Secondary | ICD-10-CM

## 2021-09-11 NOTE — Telephone Encounter (Signed)
Prescription refill request received for warfarin Lov: Hochrein, 09/09/2021 Next INR check: 2/22 Warfarin tablet strength: 5mg 

## 2021-09-23 ENCOUNTER — Ambulatory Visit (INDEPENDENT_AMBULATORY_CARE_PROVIDER_SITE_OTHER): Payer: Medicare HMO | Admitting: *Deleted

## 2021-09-23 DIAGNOSIS — Z5181 Encounter for therapeutic drug level monitoring: Secondary | ICD-10-CM

## 2021-09-23 DIAGNOSIS — I4821 Permanent atrial fibrillation: Secondary | ICD-10-CM | POA: Diagnosis not present

## 2021-09-23 DIAGNOSIS — I4819 Other persistent atrial fibrillation: Secondary | ICD-10-CM

## 2021-09-23 LAB — POCT INR: INR: 2.3 (ref 2.0–3.0)

## 2021-09-23 NOTE — Patient Instructions (Signed)
Continue warfarin 1 tablet daily except 1 1/2 tablets on Mondays and Fridays Recheck in 6 weeks 

## 2021-10-01 DIAGNOSIS — L308 Other specified dermatitis: Secondary | ICD-10-CM | POA: Diagnosis not present

## 2021-10-21 ENCOUNTER — Other Ambulatory Visit: Payer: Self-pay | Admitting: Family Medicine

## 2021-10-26 ENCOUNTER — Other Ambulatory Visit: Payer: Self-pay | Admitting: Family Medicine

## 2021-11-04 ENCOUNTER — Ambulatory Visit (INDEPENDENT_AMBULATORY_CARE_PROVIDER_SITE_OTHER): Payer: Medicare HMO | Admitting: *Deleted

## 2021-11-04 DIAGNOSIS — I4821 Permanent atrial fibrillation: Secondary | ICD-10-CM

## 2021-11-04 DIAGNOSIS — Z5181 Encounter for therapeutic drug level monitoring: Secondary | ICD-10-CM | POA: Diagnosis not present

## 2021-11-04 DIAGNOSIS — I4819 Other persistent atrial fibrillation: Secondary | ICD-10-CM

## 2021-11-04 DIAGNOSIS — Z7901 Long term (current) use of anticoagulants: Secondary | ICD-10-CM | POA: Diagnosis not present

## 2021-11-04 LAB — POCT INR: INR: 3 (ref 2.0–3.0)

## 2021-11-04 NOTE — Patient Instructions (Signed)
Continue warfarin 1 tablet daily except 1 1/2 tablets on Mondays and Fridays Recheck in 6 weeks 

## 2021-11-26 ENCOUNTER — Other Ambulatory Visit: Payer: Self-pay | Admitting: Cardiology

## 2021-11-26 DIAGNOSIS — I482 Chronic atrial fibrillation, unspecified: Secondary | ICD-10-CM

## 2021-12-16 ENCOUNTER — Ambulatory Visit (INDEPENDENT_AMBULATORY_CARE_PROVIDER_SITE_OTHER): Payer: Medicare HMO | Admitting: *Deleted

## 2021-12-16 DIAGNOSIS — I4819 Other persistent atrial fibrillation: Secondary | ICD-10-CM | POA: Diagnosis not present

## 2021-12-16 DIAGNOSIS — Z5181 Encounter for therapeutic drug level monitoring: Secondary | ICD-10-CM | POA: Diagnosis not present

## 2021-12-16 DIAGNOSIS — I4821 Permanent atrial fibrillation: Secondary | ICD-10-CM

## 2021-12-16 LAB — POCT INR: INR: 2.4 (ref 2.0–3.0)

## 2021-12-16 NOTE — Patient Instructions (Signed)
Continue warfarin 1 tablet daily except 1 1/2 tablets on Mondays and Fridays Recheck in 6 weeks 

## 2021-12-29 ENCOUNTER — Other Ambulatory Visit: Payer: Self-pay | Admitting: Family Medicine

## 2022-01-07 ENCOUNTER — Other Ambulatory Visit: Payer: Self-pay | Admitting: Cardiology

## 2022-01-07 ENCOUNTER — Other Ambulatory Visit: Payer: Self-pay | Admitting: Family Medicine

## 2022-01-07 DIAGNOSIS — I482 Chronic atrial fibrillation, unspecified: Secondary | ICD-10-CM

## 2022-01-07 DIAGNOSIS — I2510 Atherosclerotic heart disease of native coronary artery without angina pectoris: Secondary | ICD-10-CM

## 2022-01-27 ENCOUNTER — Ambulatory Visit (INDEPENDENT_AMBULATORY_CARE_PROVIDER_SITE_OTHER): Payer: Medicare HMO | Admitting: *Deleted

## 2022-01-27 DIAGNOSIS — Z5181 Encounter for therapeutic drug level monitoring: Secondary | ICD-10-CM

## 2022-01-27 DIAGNOSIS — I4819 Other persistent atrial fibrillation: Secondary | ICD-10-CM | POA: Diagnosis not present

## 2022-01-27 DIAGNOSIS — I4821 Permanent atrial fibrillation: Secondary | ICD-10-CM

## 2022-01-27 LAB — POCT INR: INR: 1.7 — AB (ref 2.0–3.0)

## 2022-01-27 NOTE — Patient Instructions (Signed)
Take warfarin 1 extra tablet today then resume 1 tablet daily except 1 1/2 tablets on Mondays and Fridays Recheck in 2 weeks

## 2022-02-11 ENCOUNTER — Ambulatory Visit (INDEPENDENT_AMBULATORY_CARE_PROVIDER_SITE_OTHER): Payer: Medicare HMO | Admitting: *Deleted

## 2022-02-11 DIAGNOSIS — I4821 Permanent atrial fibrillation: Secondary | ICD-10-CM

## 2022-02-11 DIAGNOSIS — Z5181 Encounter for therapeutic drug level monitoring: Secondary | ICD-10-CM

## 2022-02-11 DIAGNOSIS — I4819 Other persistent atrial fibrillation: Secondary | ICD-10-CM | POA: Diagnosis not present

## 2022-02-11 LAB — POCT INR: INR: 2 (ref 2.0–3.0)

## 2022-02-11 NOTE — Patient Instructions (Signed)
Continue warfarin 1 tablet daily except 1 1/2 tablets on Mondays and Fridays Recheck in 5 weeks

## 2022-02-17 ENCOUNTER — Encounter: Payer: Medicare HMO | Admitting: Family Medicine

## 2022-03-08 ENCOUNTER — Other Ambulatory Visit: Payer: Self-pay | Admitting: Family Medicine

## 2022-03-16 ENCOUNTER — Encounter: Payer: Medicare HMO | Admitting: Family Medicine

## 2022-03-23 ENCOUNTER — Ambulatory Visit (INDEPENDENT_AMBULATORY_CARE_PROVIDER_SITE_OTHER): Payer: Medicare HMO | Admitting: *Deleted

## 2022-03-23 DIAGNOSIS — Z5181 Encounter for therapeutic drug level monitoring: Secondary | ICD-10-CM

## 2022-03-23 DIAGNOSIS — I4819 Other persistent atrial fibrillation: Secondary | ICD-10-CM

## 2022-03-23 DIAGNOSIS — I4821 Permanent atrial fibrillation: Secondary | ICD-10-CM

## 2022-03-23 LAB — POCT INR: INR: 2.2 (ref 2.0–3.0)

## 2022-03-23 NOTE — Patient Instructions (Signed)
Continue warfarin 1 tablet daily except 1 1/2 tablets on Mondays and Fridays Recheck in 4 weeks 

## 2022-04-20 ENCOUNTER — Ambulatory Visit: Payer: Medicare HMO | Attending: Cardiology | Admitting: *Deleted

## 2022-04-20 DIAGNOSIS — I4821 Permanent atrial fibrillation: Secondary | ICD-10-CM | POA: Diagnosis not present

## 2022-04-20 DIAGNOSIS — I4819 Other persistent atrial fibrillation: Secondary | ICD-10-CM

## 2022-04-20 DIAGNOSIS — Z5181 Encounter for therapeutic drug level monitoring: Secondary | ICD-10-CM

## 2022-04-20 LAB — POCT INR: INR: 2.1 (ref 2.0–3.0)

## 2022-04-20 NOTE — Patient Instructions (Signed)
Continue warfarin 1 tablet daily except 1 1/2 tablets on Mondays and Fridays Recheck in 4 weeks 

## 2022-04-26 ENCOUNTER — Telehealth: Payer: Self-pay | Admitting: Family Medicine

## 2022-04-26 NOTE — Telephone Encounter (Signed)
Left message for patient to schedule Annual Wellness Visit.  Please schedule (telephone/video call) with Nurse Health Advisor Tina Betterson, RN at [facility]. Please call [phone] ask for Kathy 

## 2022-05-01 ENCOUNTER — Other Ambulatory Visit: Payer: Self-pay | Admitting: Cardiology

## 2022-05-01 DIAGNOSIS — I482 Chronic atrial fibrillation, unspecified: Secondary | ICD-10-CM

## 2022-05-03 ENCOUNTER — Telehealth: Payer: Self-pay | Admitting: Family Medicine

## 2022-05-03 NOTE — Telephone Encounter (Signed)
Left message for patient to schedule Annual Wellness Visit.  Please schedule (telephone/video call) with Nurse Health Advisor Tina Betterson, RN at [facility]. Please call [phone] ask for Kathy 

## 2022-05-03 NOTE — Telephone Encounter (Signed)
Refill request for warfarin:  Last INR was 2.1 on 04/20/22 Next INR due on 05/18/22 LOV was 02/11/22  Zandra Abts MD  Refill approved.

## 2022-05-18 ENCOUNTER — Ambulatory Visit: Payer: Medicare HMO | Attending: Cardiology | Admitting: *Deleted

## 2022-05-18 DIAGNOSIS — Z5181 Encounter for therapeutic drug level monitoring: Secondary | ICD-10-CM

## 2022-05-18 DIAGNOSIS — I4819 Other persistent atrial fibrillation: Secondary | ICD-10-CM | POA: Diagnosis not present

## 2022-05-18 DIAGNOSIS — I4821 Permanent atrial fibrillation: Secondary | ICD-10-CM

## 2022-05-18 LAB — POCT INR: INR: 2.3 (ref 2.0–3.0)

## 2022-05-18 NOTE — Patient Instructions (Signed)
Continue warfarin 1 tablet daily except 1 1/2 tablets on Mondays and Fridays Recheck in 4 weeks 

## 2022-06-02 ENCOUNTER — Other Ambulatory Visit: Payer: Self-pay | Admitting: Family Medicine

## 2022-06-02 ENCOUNTER — Encounter: Payer: Self-pay | Admitting: *Deleted

## 2022-06-15 ENCOUNTER — Ambulatory Visit: Payer: Medicare HMO | Attending: Cardiology | Admitting: *Deleted

## 2022-06-15 DIAGNOSIS — I4821 Permanent atrial fibrillation: Secondary | ICD-10-CM

## 2022-06-15 DIAGNOSIS — Z5181 Encounter for therapeutic drug level monitoring: Secondary | ICD-10-CM

## 2022-06-15 DIAGNOSIS — I4819 Other persistent atrial fibrillation: Secondary | ICD-10-CM | POA: Diagnosis not present

## 2022-06-15 LAB — POCT INR: INR: 2.2 (ref 2.0–3.0)

## 2022-06-15 NOTE — Patient Instructions (Signed)
Continue warfarin 1 tablet daily except 1 1/2 tablets on Mondays and Fridays Recheck in 4 weeks

## 2022-06-21 NOTE — Patient Instructions (Signed)
Health Maintenance, Male Adopting a healthy lifestyle and getting preventive care are important in promoting health and wellness. Ask your health care provider about: The right schedule for you to have regular tests and exams. Things you can do on your own to prevent diseases and keep yourself healthy. What should I know about diet, weight, and exercise? Eat a healthy diet  Eat a diet that includes plenty of vegetables, fruits, low-fat dairy products, and lean protein. Do not eat a lot of foods that are high in solid fats, added sugars, or sodium. Maintain a healthy weight Body mass index (BMI) is a measurement that can be used to identify possible weight problems. It estimates body fat based on height and weight. Your health care provider can help determine your BMI and help you achieve or maintain a healthy weight. Get regular exercise Get regular exercise. This is one of the most important things you can do for your health. Most adults should: Exercise for at least 150 minutes each week. The exercise should increase your heart rate and make you sweat (moderate-intensity exercise). Do strengthening exercises at least twice a week. This is in addition to the moderate-intensity exercise. Spend less time sitting. Even light physical activity can be beneficial. Watch cholesterol and blood lipids Have your blood tested for lipids and cholesterol at 86 years of age, then have this test every 5 years. You may need to have your cholesterol levels checked more often if: Your lipid or cholesterol levels are high. You are older than 86 years of age. You are at high risk for heart disease. What should I know about cancer screening? Many types of cancers can be detected early and may often be prevented. Depending on your health history and family history, you may need to have cancer screening at various ages. This may include screening for: Colorectal cancer. Prostate cancer. Skin cancer. Lung  cancer. What should I know about heart disease, diabetes, and high blood pressure? Blood pressure and heart disease High blood pressure causes heart disease and increases the risk of stroke. This is more likely to develop in people who have high blood pressure readings or are overweight. Talk with your health care provider about your target blood pressure readings. Have your blood pressure checked: Every 3-5 years if you are 18-39 years of age. Every year if you are 40 years old or older. If you are between the ages of 65 and 75 and are a current or former smoker, ask your health care provider if you should have a one-time screening for abdominal aortic aneurysm (AAA). Diabetes Have regular diabetes screenings. This checks your fasting blood sugar level. Have the screening done: Once every three years after age 45 if you are at a normal weight and have a low risk for diabetes. More often and at a younger age if you are overweight or have a high risk for diabetes. What should I know about preventing infection? Hepatitis B If you have a higher risk for hepatitis B, you should be screened for this virus. Talk with your health care provider to find out if you are at risk for hepatitis B infection. Hepatitis C Blood testing is recommended for: Everyone born from 1945 through 1965. Anyone with known risk factors for hepatitis C. Sexually transmitted infections (STIs) You should be screened each year for STIs, including gonorrhea and chlamydia, if: You are sexually active and are younger than 86 years of age. You are older than 86 years of age and your   health care provider tells you that you are at risk for this type of infection. Your sexual activity has changed since you were last screened, and you are at increased risk for chlamydia or gonorrhea. Ask your health care provider if you are at risk. Ask your health care provider about whether you are at high risk for HIV. Your health care provider  may recommend a prescription medicine to help prevent HIV infection. If you choose to take medicine to prevent HIV, you should first get tested for HIV. You should then be tested every 3 months for as long as you are taking the medicine. Follow these instructions at home: Alcohol use Do not drink alcohol if your health care provider tells you not to drink. If you drink alcohol: Limit how much you have to 0-2 drinks a day. Know how much alcohol is in your drink. In the [place] one drink equals one 12 oz bottle of beer (355 mL), one 5 oz glass of wine (148 mL), or one 1 oz glass of hard liquor (44 mL). Lifestyle Do not use any products that contain nicotine or tobacco. These products include cigarettes, chewing tobacco, and vaping devices, such as e-cigarettes. If you need help quitting, ask your health care provider. Do not use street drugs. Do not share needles. Ask your health care provider for help if you need support or information about quitting drugs. General instructions Schedule regular health, dental, and eye exams. Stay current with your vaccines. Tell your health care provider if: You often feel depressed. You have ever been abused or do not feel safe at home. Summary Adopting a healthy lifestyle and getting preventive care are important in promoting health and wellness. Follow your health care provider's instructions about healthy diet, exercising, and getting tested or screened for diseases. Follow your health care provider's instructions on monitoring your cholesterol and blood pressure. This information is not intended to replace advice given to you by your health care provider. Make sure you discuss any questions you have with your health care provider. Document Revised: 12/08/2020 Document Reviewed: 12/08/2020 Elsevier Patient Education  2023 [employer]vier Inc.  

## 2022-06-22 ENCOUNTER — Telehealth: Payer: Self-pay | Admitting: Family Medicine

## 2022-06-22 NOTE — Telephone Encounter (Signed)
Spoke with patient he declined AWV since he was having physical with Dr Tanja Port.

## 2022-06-29 ENCOUNTER — Encounter: Payer: Self-pay | Admitting: Family Medicine

## 2022-06-29 ENCOUNTER — Ambulatory Visit (INDEPENDENT_AMBULATORY_CARE_PROVIDER_SITE_OTHER): Payer: Medicare HMO | Admitting: Family Medicine

## 2022-06-29 VITALS — BP 144/74 | HR 73 | Temp 97.7°F | Ht 66.75 in | Wt 143.4 lb

## 2022-06-29 DIAGNOSIS — Z7901 Long term (current) use of anticoagulants: Secondary | ICD-10-CM

## 2022-06-29 DIAGNOSIS — D696 Thrombocytopenia, unspecified: Secondary | ICD-10-CM | POA: Diagnosis not present

## 2022-06-29 DIAGNOSIS — Z0000 Encounter for general adult medical examination without abnormal findings: Secondary | ICD-10-CM

## 2022-06-29 DIAGNOSIS — E7800 Pure hypercholesterolemia, unspecified: Secondary | ICD-10-CM

## 2022-06-29 DIAGNOSIS — E119 Type 2 diabetes mellitus without complications: Secondary | ICD-10-CM | POA: Diagnosis not present

## 2022-06-29 DIAGNOSIS — Z23 Encounter for immunization: Secondary | ICD-10-CM

## 2022-06-29 DIAGNOSIS — R7303 Prediabetes: Secondary | ICD-10-CM

## 2022-06-29 LAB — COMPREHENSIVE METABOLIC PANEL
ALT: 18 U/L (ref 0–53)
AST: 18 U/L (ref 0–37)
Albumin: 4.3 g/dL (ref 3.5–5.2)
Alkaline Phosphatase: 56 U/L (ref 39–117)
BUN: 14 mg/dL (ref 6–23)
CO2: 31 mEq/L (ref 19–32)
Calcium: 9.7 mg/dL (ref 8.4–10.5)
Chloride: 101 mEq/L (ref 96–112)
Creatinine, Ser: 0.73 mg/dL (ref 0.40–1.50)
GFR: 82.43 mL/min (ref >60.00)
Glucose, Bld: 86 mg/dL (ref 70–99)
Potassium: 4.2 mEq/L (ref 3.5–5.1)
Sodium: 140 mEq/L (ref 135–145)
Total Bilirubin: 1.8 mg/dL — ABNORMAL HIGH (ref 0.2–1.2)
Total Protein: 6.1 g/dL (ref 6.0–8.3)

## 2022-06-29 LAB — LIPID PANEL
Cholesterol: 126 mg/dL (ref 0–200)
HDL: 51.6 mg/dL (ref >39.00)
LDL Cholesterol: 61 mg/dL (ref 0–99)
NonHDL: 74.21
Total CHOL/HDL Ratio: 2
Triglycerides: 66 mg/dL (ref 0.0–149.0)
VLDL: 13.2 mg/dL (ref 0.0–40.0)

## 2022-06-29 LAB — CBC
HCT: 45.3 % (ref 39.0–52.0)
Hemoglobin: 15.5 g/dL (ref 13.0–17.0)
MCHC: 34.1 g/dL (ref 30.0–36.0)
MCV: 92.1 fl (ref 78.0–100.0)
Platelets: 146 10*3/uL — ABNORMAL LOW (ref 150.0–400.0)
RBC: 4.92 Mil/uL (ref 4.22–5.81)
RDW: 13.8 % (ref 11.5–15.5)
WBC: 7.2 10*3/uL (ref 4.0–10.5)

## 2022-06-29 LAB — HEMOGLOBIN A1C: Hgb A1c MFr Bld: 5.9 % (ref 4.6–6.5)

## 2022-06-29 NOTE — Progress Notes (Signed)
Office Note 06/29/2022  CC:  Chief Complaint  Patient presents with   Annual Exam    Pt is fasting   Patient is a 88 y.o. male who is here for annual health maintenance exam and follow-up hypertension, hyperlipidemia, and borderline diabetes. A/P as of last visit: "1) DM 2, diet controlled. Hba1c today.   2) HLD: tolerating atorva 40 qd. Goal LDL <70. LDL was 59 in Feb 2022. FLP and hepatic panel today.   3) Chronic thrombocytopenia, has been stable long term, suspect ITP. Cbc today.   4) Health maintenance exam: Reviewed age and gender appropriate health maintenance issues (prudent diet, regular exercise, health risks of tobacco and excessive alcohol, use of seatbelts, fire alarms in home, use of sunscreen).  Also reviewed age and gender appropriate health screening as well as vaccine recommendations. Vaccines: ALL UTD. Labs: cbc, cmet, flp, a1c. Prostate ca screening: no further screening indicated. Colon ca screening: hx of polyps, most recently 07/2016->next colonoscopy around 07/2021 if pt chooses to pursue further screening-->as of now he does not want to pursue this."   INTERIM HX: Del is doing good. Home blood pressures consistently in the 120s over 70s. Diet and activity levels are excellent.  Past Medical History:  Diagnosis Date   Adenomatous colon polyp    Allergic rhinitis    primarily seasonal   Atrial fibrillation ([place])    peri op afib initially, then dx'd with permanent a-fib 2014 (xarelto started, switched to coumadin for cost reasons)   Borderline diabetes 05/2015; 03/2017   2016 A1c 5.8%.  2018 A1c 6.0%.  12/2017 A1c 5.9%. 08/2019 a1c 6.0%. Fasting gluc > 126 once and then 125 on f/u (08/2019). 01/2021 a1c 6.2%   CAD (coronary artery disease)    CABG 2011   Chronic idiopathic thrombocytopenia ([place])    very mild   COVID-19 virus infection 01/30/2021   Diverticulosis 2012 scope   pancolonic   GERD (gastroesophageal reflux disease)    Hearing  impairment    AU; has hearing aids   Hemorrhoids    banding 12/2019   Hypercholesteremia    Hypertension    Pneumonia    Recurrent low back pain    DDD.  PT per [facility] as of 03/2020   Ulcerative esophagitis     Past Surgical History:  Procedure Laterality Date   CARDIOVASCULAR STRESS TEST  12/2012   Nuclear stress (exercise) testing: low risk scan   CATARACT EXTRACTION     COLONOSCOPY N/A 07/22/2016   Procedure: COLONOSCOPY;  Surgeon: Rogene Houston, MD;  [facility]: [facility];  Service: Endoscopy;  Laterality: N/A;  1:55   COLONOSCOPY W/ POLYPECTOMY  X 4, most recent 07/22/16   2017: tubular adenoma x 2 (recall 5 yrs--? age ?)   CORONARY ARTERY BYPASS GRAFT  2011   L - LAD, S - diag, S - PDA/LV branch 2011.  Nl EF   HEMORRHOID BANDING  12/2019   INGUINAL HERNIA REPAIR  1970s   right groin   POLYPECTOMY  07/22/2016   Procedure: POLYPECTOMY;  Surgeon: Rogene Houston, MD;  Location: [facility];  Service: Endoscopy;;  hepatic flexure,  sigmoid colon,    TONSILLECTOMY AND ADENOIDECTOMY  as a child   UPPER GI ENDOSCOPY  09/03/2010   hiatus hernia, no Barrett's esoph,esoph stricture dilated, gastric ulcers (h pylori neg)    Family History  Problem Relation Age of Onset   Stroke Mother    Pneumonia Father    Diabetes Father  Coronary artery disease Brother 50       stent   Diabetes Brother     Social History   Socioeconomic History   Marital status: Married    Spouse name: Not on file   Number of children: 5   Years of education: Not on file   Highest education level: Not on file  Occupational History   Occupation: Retired  Tobacco Use   Smoking status: Never   Smokeless tobacco: Never  Vaping Use   Vaping Use: Never used  Substance and Sexual Activity   Alcohol use: No    Alcohol/week: 0.0 standard drinks of alcohol   Drug use: No   Sexual activity: Not on file  Other Topics Concern   Not on file  Social History Narrative   Married, 5 children, 10  grandchildren.   Relocated to [place] from [place] in 2012 for warmer climate.  Orig from [place], [place].   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   Hobby: woodworking.   No T/A/Ds.   Exercise: treadmill and stationary bike--issue with knee temporarily got him out of routine--set to restart 03/2013.   Diet: prudent diet but nothing drastic.         Social Determinants of Health   Financial Resource Strain: Low Risk  (05/01/2021)   Overall Financial Resource Strain (CARDIA)    Difficulty of Paying Living Expenses: Not hard at all  Food Insecurity: No Food Insecurity (05/01/2021)   Hunger Vital Sign    Worried About Running Out of Food in the Last Year: Never true    Ran Out of Food in the Last Year: Never true  Transportation Needs: No Transportation Needs (05/01/2021)   PRAPARE - Hydrologist (Medical): No    Lack of Transportation (Non-Medical): No  Physical Activity: Inactive (05/01/2021)   Exercise Vital Sign    Days of Exercise per Week: 0 days    Minutes of Exercise per Session: 0 min  Stress: No Stress Concern Present (05/01/2021)   [place]    Feeling of Stress : Not at all  Social Connections: [place] (05/01/2021)   Social Connection and Isolation Panel [NHANES]    Frequency of Communication with Friends and Family: Once a week    Frequency of Social Gatherings with Friends and Family: Twice a week    Attends Religious Services: More than 4 times per year    Active Member of [employer] or Organizations: Yes    Attends Music therapist: More than 4 times per year    Marital Status: Married  Human resources officer Violence: Not At Risk (05/01/2021)   Humiliation, Afraid, Rape, and Kick questionnaire    Fear of Current or Ex-Partner: No    Emotionally Abused: No    Physically Abused: No    Sexually Abused: No    Outpatient Medications Prior to  Visit  Medication Sig Dispense Refill   acetaminophen (TYLENOL) 500 MG tablet Take 1,000 mg by mouth daily as needed for moderate pain or headache.     atorvastatin (LIPITOR) 40 MG tablet TAKE 1 TABLET EVERY DAY 90 tablet 2   cetirizine (ZYRTEC) 10 MG tablet Take 10 mg by mouth as needed for allergies.      fluticasone (FLONASE) 50 MCG/ACT nasal spray USE 2 SPRAYS IN EACH NOSTRIL EVERY DAY 48 g 3   ipratropium (ATROVENT) 0.06 % nasal spray USE 2 SPRAYS IN EACH NOSTRIL THREE TIMES DAILY  105 mL 0   Menthol, Topical Analgesic, (BIOFREEZE ROLL-ON EX) Apply topically as needed.     metoprolol tartrate (LOPRESSOR) 25 MG tablet TAKE 1 TABLET (25 MG TOTAL) BY MOUTH 2 (TWO) TIMES DAILY. 180 tablet 3   Omega-3 Fatty Acids (FISH OIL PO) Take 360 mg by mouth every evening.     triamcinolone ointment (KENALOG) 0.1 % APPLY 1 APPLICATION TOPICALLY TWO TIMES DAILY AS NEEDED (ECZEMA). 30 g 1   warfarin (COUMADIN) 5 MG tablet TAKE 1 TABLET TO 1 AND 1/2 TABLETS EVERY DAY AS DIRECTED BY COUMADIN CLINIC 135 tablet 1   No facility-administered medications prior to visit.    Allergies  Allergen Reactions   Sulfa Antibiotics Itching    Cannot remember what kind of reaction    ROS Review of Systems  Constitutional:  Negative for appetite change, chills, fatigue and fever.  HENT:  Negative for congestion, dental problem, ear pain and sore throat.   Eyes:  Negative for discharge, redness and visual disturbance.  Respiratory:  Negative for cough, chest tightness, shortness of breath and wheezing.   Cardiovascular:  Negative for chest pain, palpitations and leg swelling.  Gastrointestinal:  Negative for abdominal pain, blood in stool, diarrhea, nausea and vomiting.  Genitourinary:  Negative for difficulty urinating, dysuria, flank pain, frequency, hematuria and urgency.  Musculoskeletal:  Negative for arthralgias, back pain, joint swelling, myalgias and neck stiffness.  Skin:  Negative for pallor and rash.   Neurological:  Negative for dizziness, speech difficulty, weakness and headaches.  Hematological:  Negative for adenopathy. Does not bruise/bleed easily.  Psychiatric/Behavioral:  Negative for confusion and sleep disturbance. The patient is not nervous/anxious.     PE;    06/29/2022   10:44 AM 09/09/2021    9:42 AM 04/23/2021    4:05 PM  Vitals with BMI  Height 5' 6.75"  5' 7.75"  Weight 143 lbs 6 oz 151 lbs 154 lbs 6 oz  BMI 16.10  96.04  Systolic 540 981 191  Diastolic 74 74 77  Pulse 73 70 82   Gen: Alert, well appearing.  Patient is oriented to person, place, time, and situation. AFFECT: pleasant, lucid thought and speech. ENT: Ears: EACs clear, normal epithelium.  TMs with good light reflex and landmarks bilaterally.  Eyes: no injection, icteris, swelling, or exudate.  EOMI, PERRLA. Nose: no drainage or turbinate edema/swelling.  No injection or focal lesion.  Mouth: lips without lesion/swelling.  Oral mucosa pink and moist.  Dentition intact and without obvious caries or gingival swelling.  Oropharynx without erythema, exudate, or swelling.  Neck: supple/nontender.  No LAD, mass, or TM.  Carotid pulses 2+ bilaterally, without bruits. CV: RRR, no m/r/g.   LUNGS: CTA bilat, nonlabored resps, good aeration in all lung fields. ABD: soft, NT, ND, BS normal.  No hepatospenomegaly or mass.  No bruits. EXT: no clubbing, cyanosis, or edema.  Musculoskeletal: no joint swelling, erythema, warmth, or tenderness.  ROM of all joints intact. Skin - no sores or suspicious lesions or rashes or color changes  Pertinent labs:  Lab Results  Component Value Date   TSH 1.99 05/26/2015   Lab Results  Component Value Date   WBC 8.6 02/16/2021   HGB 15.3 02/16/2021   HCT 44.7 02/16/2021   MCV 91.2 02/16/2021   PLT 172.0 02/16/2021   Lab Results  Component Value Date   CREATININE 0.83 02/16/2021   BUN 11 02/16/2021   NA 138 02/16/2021   K 4.5 02/16/2021   CL 102  02/16/2021   CO2 28  02/16/2021   Lab Results  Component Value Date   ALT 21 02/16/2021   AST 18 02/16/2021   ALKPHOS 56 02/16/2021   BILITOT 1.5 (H) 02/16/2021   Lab Results  Component Value Date   CHOL 108 02/16/2021   Lab Results  Component Value Date   HDL 43.10 02/16/2021   Lab Results  Component Value Date   LDLCALC 51 02/16/2021   Lab Results  Component Value Date   TRIG 66.0 02/16/2021   Lab Results  Component Value Date   CHOLHDL 2 02/16/2021   Lab Results  Component Value Date   PSA 1.43 05/26/2015   PSA 1.01 03/20/2013   Lab Results  Component Value Date   HGBA1C 6.2 02/16/2021   ASSESSMENT AND PLAN:   Health maintenance exam: Reviewed age and gender appropriate health maintenance issues (prudent diet, regular exercise, health risks of tobacco and excessive alcohol, use of seatbelts, fire alarms in home, use of sunscreen).  Also reviewed age and gender appropriate health screening as well as vaccine recommendations. Vaccines: Flu->given today.   Otherwise ALL UTD. Labs: cbc, cmet, flp, a1c. Prostate ca screening: no further screening indicated. Colon ca screening: hx of polyps, most recently 07/2016->next colonoscopy around 07/2021 if pt chooses to pursue further screening-->as of now he does not want to pursue this.  Del is moving back home to [place] and we wish him the best.  He will be missed. An After Visit Summary was printed and given to the patient.  FOLLOW UP:  Return for no f/u at this time.    Signed:  Crissie Sickles, MD           06/29/2022

## 2022-07-13 ENCOUNTER — Ambulatory Visit: Payer: Medicare HMO | Attending: Cardiology | Admitting: *Deleted

## 2022-07-13 DIAGNOSIS — I4821 Permanent atrial fibrillation: Secondary | ICD-10-CM | POA: Diagnosis not present

## 2022-07-13 DIAGNOSIS — I4819 Other persistent atrial fibrillation: Secondary | ICD-10-CM | POA: Diagnosis not present

## 2022-07-13 DIAGNOSIS — Z5181 Encounter for therapeutic drug level monitoring: Secondary | ICD-10-CM

## 2022-07-13 DIAGNOSIS — Z7901 Long term (current) use of anticoagulants: Secondary | ICD-10-CM

## 2022-07-13 LAB — POCT INR: INR: 2.3 (ref 2.0–3.0)

## 2022-07-13 NOTE — Patient Instructions (Signed)
Continue warfarin 1 tablet daily except 1 1/2 tablets on Mondays and Fridays Recheck in 4 weeks

## 2022-08-10 ENCOUNTER — Ambulatory Visit: Payer: Medicare HMO | Attending: Cardiology | Admitting: *Deleted

## 2022-08-10 DIAGNOSIS — I4819 Other persistent atrial fibrillation: Secondary | ICD-10-CM

## 2022-08-10 DIAGNOSIS — I4821 Permanent atrial fibrillation: Secondary | ICD-10-CM

## 2022-08-10 DIAGNOSIS — Z5181 Encounter for therapeutic drug level monitoring: Secondary | ICD-10-CM | POA: Diagnosis not present

## 2022-08-10 LAB — POCT INR: INR: 1.9 — AB (ref 2.0–3.0)

## 2022-08-10 NOTE — Patient Instructions (Signed)
Take warfarin 1 1/2 tablets today then continue 1 tablet daily except 1 1/2 tablets on Mondays and Fridays Recheck in 4 weeks

## 2022-08-14 ENCOUNTER — Other Ambulatory Visit: Payer: Self-pay | Admitting: Family Medicine

## 2022-09-07 ENCOUNTER — Ambulatory Visit: Payer: Medicare HMO | Attending: Cardiology | Admitting: *Deleted

## 2022-09-07 DIAGNOSIS — Z5181 Encounter for therapeutic drug level monitoring: Secondary | ICD-10-CM

## 2022-09-07 DIAGNOSIS — Z7901 Long term (current) use of anticoagulants: Secondary | ICD-10-CM | POA: Diagnosis not present

## 2022-09-07 DIAGNOSIS — I4819 Other persistent atrial fibrillation: Secondary | ICD-10-CM

## 2022-09-07 DIAGNOSIS — I4821 Permanent atrial fibrillation: Secondary | ICD-10-CM

## 2022-09-07 LAB — POCT INR: INR: 2.4 (ref 2.0–3.0)

## 2022-09-07 NOTE — Patient Instructions (Addendum)
Continue warfarin 1 tablet daily except 1 1/2 tablets on Mondays and Fridays Recheck in 4 weeks with new MD Moving back to [place]

## 2022-09-20 ENCOUNTER — Telehealth: Payer: Self-pay | Admitting: Family Medicine

## 2022-09-20 ENCOUNTER — Telehealth: Payer: Self-pay

## 2022-09-20 NOTE — Telephone Encounter (Signed)
LVM for pt to call back in regards to scheduling AWV with our health coach.   09/20/2022@currenttime$ @

## 2022-09-20 NOTE — Telephone Encounter (Signed)
Called patient to schedule Medicare Annual Wellness Visit (AWV). Left message for patient to call back and schedule Medicare Annual Wellness Visit (AWV).  Last date of AWV: 05/01/2021  Please schedule an appointment at any time with Otila Kluver, [facility].  If any questions, please contact me at [phone].  Thank you ,  Shaune Pollack [facility] AWV TEAM Direct Dial [phone]

## 2022-10-05 DIAGNOSIS — I4891 Unspecified atrial fibrillation: Secondary | ICD-10-CM | POA: Diagnosis not present

## 2022-10-12 DIAGNOSIS — I4821 Permanent atrial fibrillation: Secondary | ICD-10-CM | POA: Diagnosis not present

## 2022-10-12 DIAGNOSIS — R011 Cardiac murmur, unspecified: Secondary | ICD-10-CM | POA: Diagnosis not present

## 2022-10-12 DIAGNOSIS — I10 Essential (primary) hypertension: Secondary | ICD-10-CM | POA: Diagnosis not present

## 2022-10-12 DIAGNOSIS — R0609 Other forms of dyspnea: Secondary | ICD-10-CM | POA: Diagnosis not present

## 2022-10-12 DIAGNOSIS — E7800 Pure hypercholesterolemia, unspecified: Secondary | ICD-10-CM | POA: Diagnosis not present

## 2022-10-12 DIAGNOSIS — I25810 Atherosclerosis of coronary artery bypass graft(s) without angina pectoris: Secondary | ICD-10-CM | POA: Diagnosis not present

## 2022-10-18 DIAGNOSIS — I4891 Unspecified atrial fibrillation: Secondary | ICD-10-CM | POA: Diagnosis not present

## 2022-10-18 DIAGNOSIS — R1310 Dysphagia, unspecified: Secondary | ICD-10-CM | POA: Diagnosis not present

## 2022-10-18 DIAGNOSIS — H9193 Unspecified hearing loss, bilateral: Secondary | ICD-10-CM | POA: Diagnosis not present

## 2022-10-18 DIAGNOSIS — I10 Essential (primary) hypertension: Secondary | ICD-10-CM | POA: Diagnosis not present

## 2022-10-25 DIAGNOSIS — E7800 Pure hypercholesterolemia, unspecified: Secondary | ICD-10-CM | POA: Diagnosis not present

## 2022-10-25 DIAGNOSIS — I10 Essential (primary) hypertension: Secondary | ICD-10-CM | POA: Diagnosis not present

## 2022-10-30 ENCOUNTER — Other Ambulatory Visit: Payer: Self-pay | Admitting: Cardiology

## 2022-10-30 ENCOUNTER — Other Ambulatory Visit: Payer: Self-pay | Admitting: Family Medicine

## 2022-10-30 DIAGNOSIS — I482 Chronic atrial fibrillation, unspecified: Secondary | ICD-10-CM

## 2022-10-30 DIAGNOSIS — I2510 Atherosclerotic heart disease of native coronary artery without angina pectoris: Secondary | ICD-10-CM

## 2022-11-16 ENCOUNTER — Other Ambulatory Visit: Payer: Self-pay | Admitting: Family Medicine

## 2022-12-17 DIAGNOSIS — I10 Essential (primary) hypertension: Secondary | ICD-10-CM | POA: Diagnosis not present

## 2022-12-17 DIAGNOSIS — Z0000 Encounter for general adult medical examination without abnormal findings: Secondary | ICD-10-CM | POA: Diagnosis not present

## 2022-12-17 DIAGNOSIS — H9193 Unspecified hearing loss, bilateral: Secondary | ICD-10-CM | POA: Diagnosis not present

## 2022-12-17 DIAGNOSIS — L989 Disorder of the skin and subcutaneous tissue, unspecified: Secondary | ICD-10-CM | POA: Diagnosis not present

## 2022-12-17 DIAGNOSIS — R7303 Prediabetes: Secondary | ICD-10-CM | POA: Diagnosis not present

## 2023-01-14 ENCOUNTER — Other Ambulatory Visit: Payer: Self-pay | Admitting: Cardiology

## 2023-01-21 DIAGNOSIS — R634 Abnormal weight loss: Secondary | ICD-10-CM | POA: Diagnosis not present

## 2023-01-21 DIAGNOSIS — R1310 Dysphagia, unspecified: Secondary | ICD-10-CM | POA: Diagnosis not present

## 2023-01-31 DIAGNOSIS — R0609 Other forms of dyspnea: Secondary | ICD-10-CM | POA: Diagnosis not present

## 2023-01-31 DIAGNOSIS — I4821 Permanent atrial fibrillation: Secondary | ICD-10-CM | POA: Diagnosis not present

## 2023-01-31 DIAGNOSIS — I25810 Atherosclerosis of coronary artery bypass graft(s) without angina pectoris: Secondary | ICD-10-CM | POA: Diagnosis not present

## 2023-01-31 DIAGNOSIS — R011 Cardiac murmur, unspecified: Secondary | ICD-10-CM | POA: Diagnosis not present

## 2023-02-09 IMAGING — DX DG SHOULDER 2+V*R*
3 series · 3 of 3 positions shown · non-contrast
Comparison: None.

CLINICAL DATA: Chronic right shoulder pain. Right shoulder pain for
3 months. Pain onset after trying to start a gas leaf blower.

EXAM:
RIGHT SHOULDER - 2+ VIEW

[shoulder grashey]
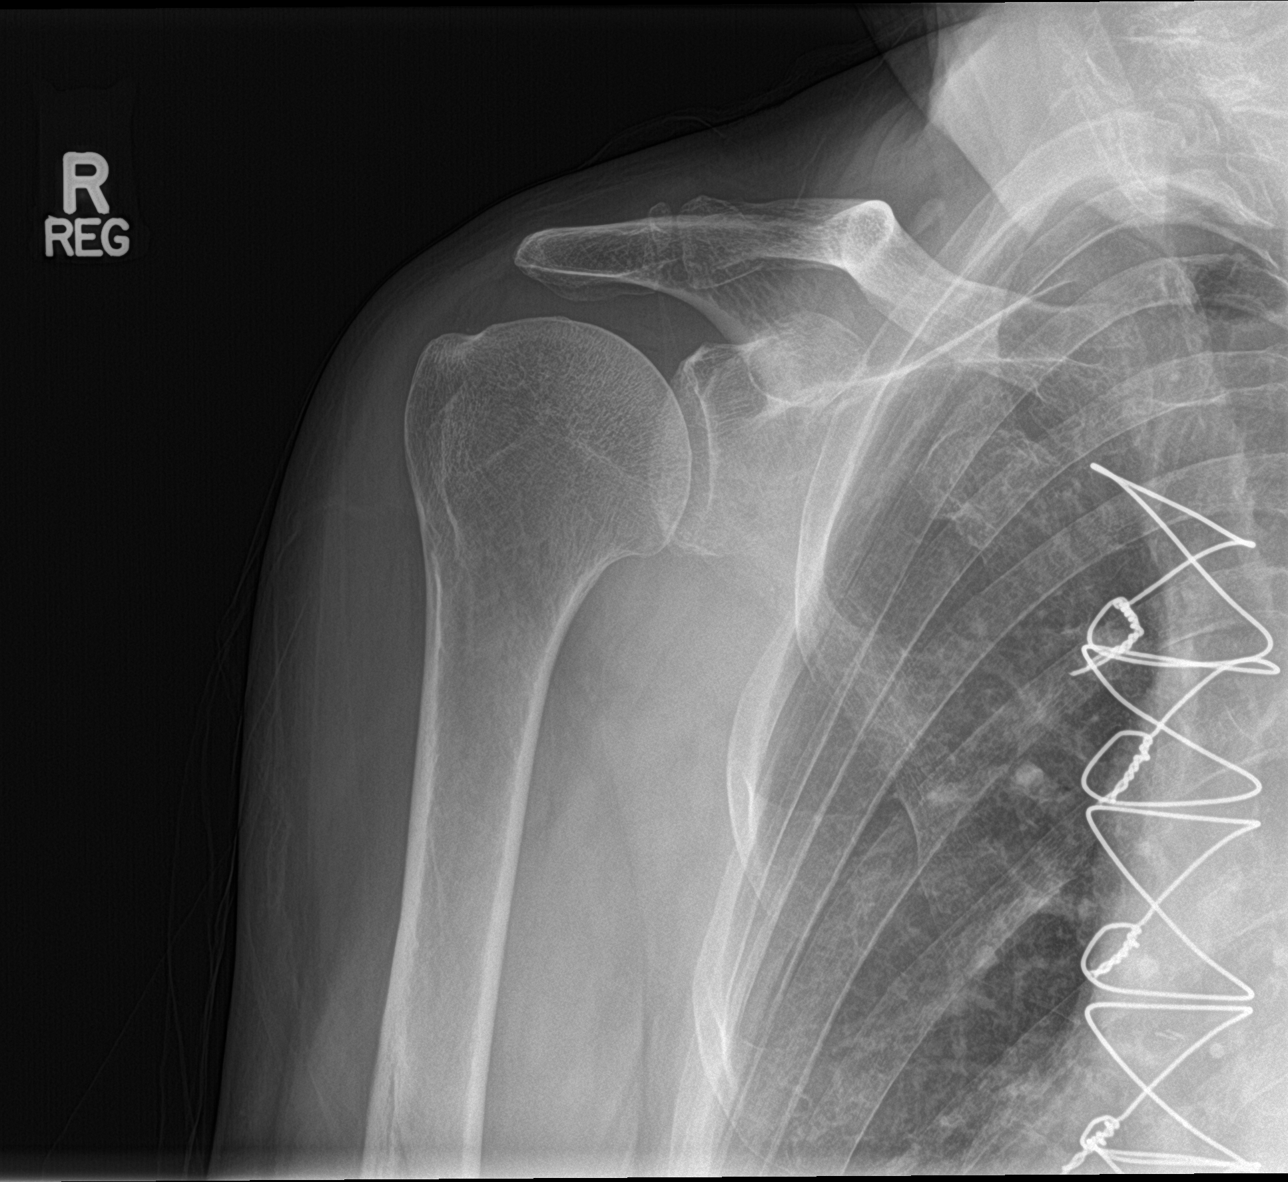

[shoulder y view]
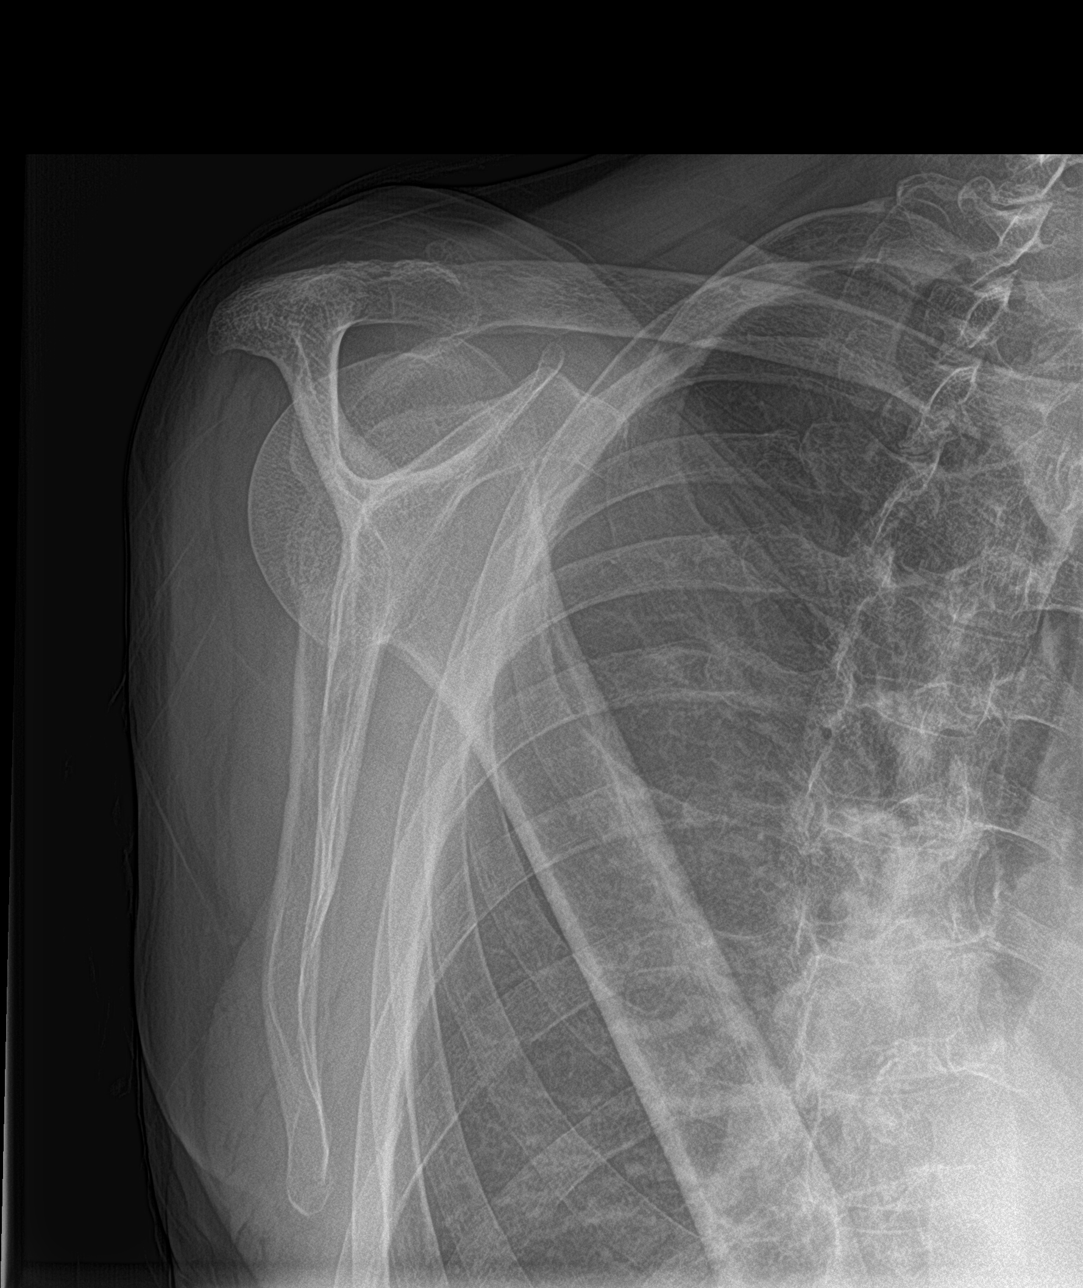

[shoulder axillary]
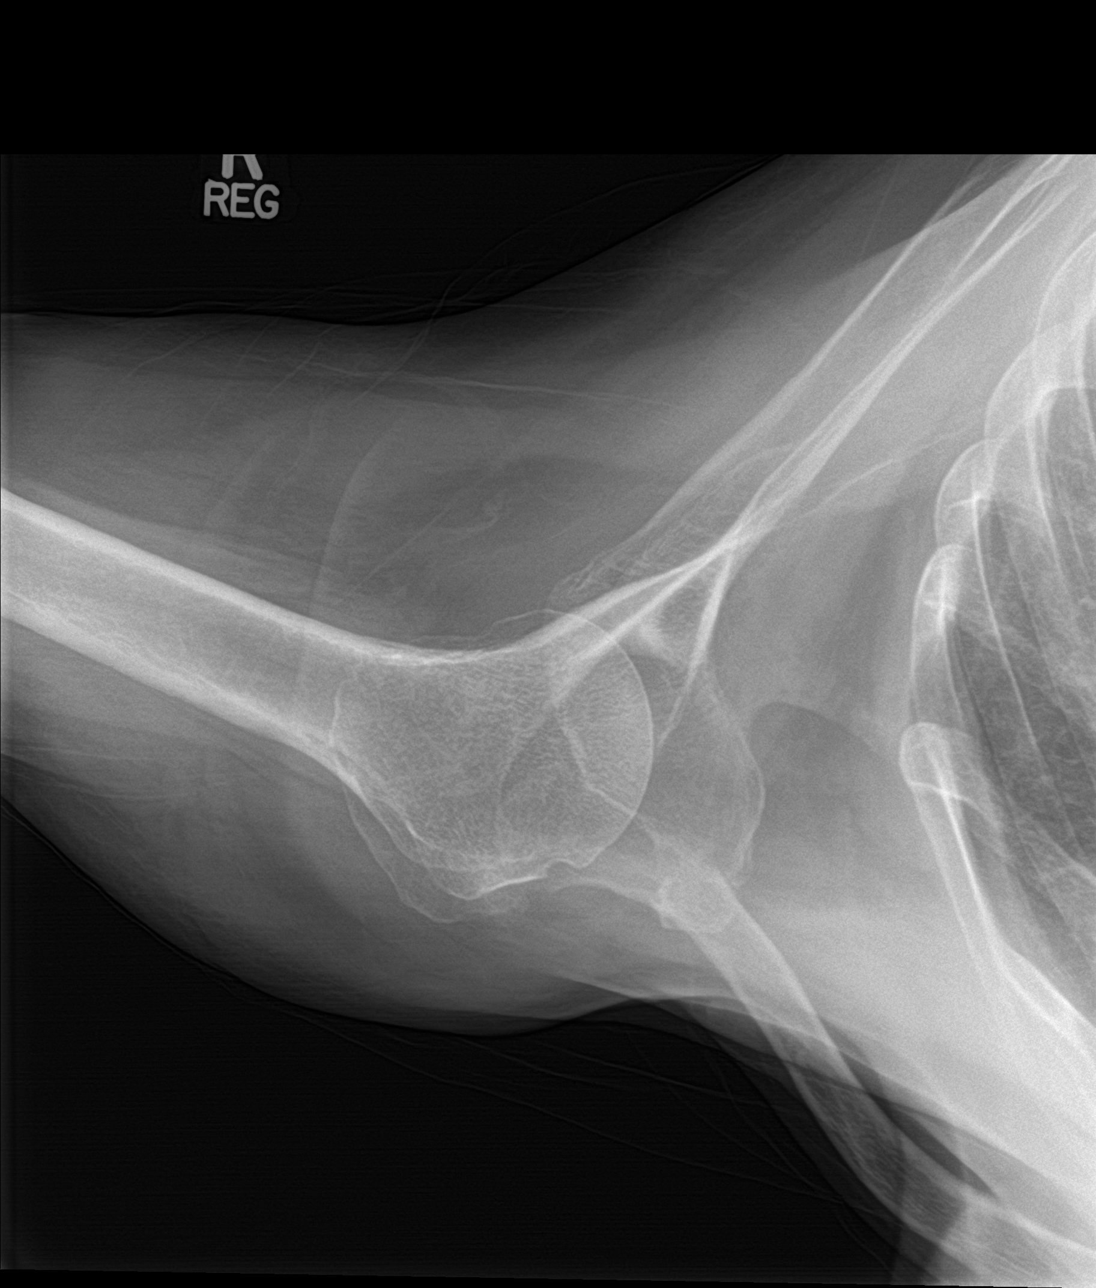

[3 of 3 positions shown; findings below may reference images not displayed]

FINDINGS: There is no evidence of fracture or dislocation. Acromioclavicular
degenerative changes mild for age. Glenohumeral joint is normal. No
erosion, evidence of avascular necrosis, or focal bone abnormality.
Soft tissues are unremarkable.
IMPRESSION: Mild acromioclavicular osteoarthritis.

## 2023-03-01 DIAGNOSIS — J0100 Acute maxillary sinusitis, unspecified: Secondary | ICD-10-CM | POA: Diagnosis not present

## 2023-03-01 DIAGNOSIS — R0982 Postnasal drip: Secondary | ICD-10-CM | POA: Diagnosis not present

## 2023-03-01 DIAGNOSIS — J40 Bronchitis, not specified as acute or chronic: Secondary | ICD-10-CM | POA: Diagnosis not present

## 2023-04-14 DIAGNOSIS — M542 Cervicalgia: Secondary | ICD-10-CM | POA: Diagnosis not present

## 2023-04-14 DIAGNOSIS — R1310 Dysphagia, unspecified: Secondary | ICD-10-CM | POA: Diagnosis not present

## 2023-04-20 DIAGNOSIS — Z0000 Encounter for general adult medical examination without abnormal findings: Secondary | ICD-10-CM | POA: Diagnosis not present

## 2023-04-20 DIAGNOSIS — R7303 Prediabetes: Secondary | ICD-10-CM | POA: Diagnosis not present

## 2023-06-22 DIAGNOSIS — R1310 Dysphagia, unspecified: Secondary | ICD-10-CM | POA: Diagnosis not present

## 2023-06-22 DIAGNOSIS — R634 Abnormal weight loss: Secondary | ICD-10-CM | POA: Diagnosis not present

## 2023-06-22 DIAGNOSIS — Z1211 Encounter for screening for malignant neoplasm of colon: Secondary | ICD-10-CM | POA: Diagnosis not present

## 2023-06-27 DIAGNOSIS — R634 Abnormal weight loss: Secondary | ICD-10-CM | POA: Diagnosis not present

## 2023-06-27 DIAGNOSIS — K5730 Diverticulosis of large intestine without perforation or abscess without bleeding: Secondary | ICD-10-CM | POA: Diagnosis not present

## 2023-06-27 DIAGNOSIS — R1319 Other dysphagia: Secondary | ICD-10-CM | POA: Diagnosis not present

## 2023-06-27 DIAGNOSIS — R1310 Dysphagia, unspecified: Secondary | ICD-10-CM | POA: Diagnosis not present

## 2023-06-27 DIAGNOSIS — K209 Esophagitis, unspecified without bleeding: Secondary | ICD-10-CM | POA: Diagnosis not present

## 2023-06-27 DIAGNOSIS — K222 Esophageal obstruction: Secondary | ICD-10-CM | POA: Diagnosis not present

## 2023-06-27 DIAGNOSIS — K449 Diaphragmatic hernia without obstruction or gangrene: Secondary | ICD-10-CM | POA: Diagnosis not present

## 2023-08-01 DIAGNOSIS — R634 Abnormal weight loss: Secondary | ICD-10-CM | POA: Diagnosis not present

## 2023-08-01 DIAGNOSIS — K5790 Diverticulosis of intestine, part unspecified, without perforation or abscess without bleeding: Secondary | ICD-10-CM | POA: Diagnosis not present

## 2023-08-01 DIAGNOSIS — Z1211 Encounter for screening for malignant neoplasm of colon: Secondary | ICD-10-CM | POA: Diagnosis not present

## 2023-08-01 DIAGNOSIS — R1319 Other dysphagia: Secondary | ICD-10-CM | POA: Diagnosis not present

## 2023-08-21 ENCOUNTER — Other Ambulatory Visit: Payer: Self-pay | Admitting: Internal Medicine

## 2023-08-21 DIAGNOSIS — I482 Chronic atrial fibrillation, unspecified: Secondary | ICD-10-CM

## 2023-08-21 DIAGNOSIS — I2510 Atherosclerotic heart disease of native coronary artery without angina pectoris: Secondary | ICD-10-CM

## 2023-09-24 ENCOUNTER — Other Ambulatory Visit: Payer: Self-pay | Admitting: Internal Medicine

## 2023-09-24 DIAGNOSIS — I482 Chronic atrial fibrillation, unspecified: Secondary | ICD-10-CM

## 2023-09-24 DIAGNOSIS — I2510 Atherosclerotic heart disease of native coronary artery without angina pectoris: Secondary | ICD-10-CM

## 2023-11-16 ENCOUNTER — Other Ambulatory Visit: Payer: Self-pay | Admitting: Cardiology

## 2023-11-22 ENCOUNTER — Telehealth: Payer: Self-pay | Admitting: Cardiology

## 2023-11-22 NOTE — Telephone Encounter (Signed)
 Deleting recall- unable to contact pt to sch after multiple attempts

## 2024-04-03 ENCOUNTER — Encounter: Payer: Self-pay | Admitting: Sports Medicine

## 2024-06-13 ENCOUNTER — Other Ambulatory Visit: Payer: Self-pay | Admitting: Cardiology

## 2024-06-13 ENCOUNTER — Other Ambulatory Visit: Payer: Self-pay | Admitting: Internal Medicine

## 2024-06-13 DIAGNOSIS — I2510 Atherosclerotic heart disease of native coronary artery without angina pectoris: Secondary | ICD-10-CM

## 2024-06-13 DIAGNOSIS — I482 Chronic atrial fibrillation, unspecified: Secondary | ICD-10-CM
